# Patient Record
Sex: Male | Born: 1940 | Race: White | Hispanic: No | Smoking: Former smoker
Health system: Southern US, Community
[De-identification: ages and names within clinical notes are randomized; demographics above are authoritative.]

## PROBLEM LIST (undated history)

## (undated) DIAGNOSIS — E119 Type 2 diabetes mellitus without complications: Secondary | ICD-10-CM

## (undated) DIAGNOSIS — R351 Nocturia: Secondary | ICD-10-CM

## (undated) DIAGNOSIS — I350 Nonrheumatic aortic (valve) stenosis: Secondary | ICD-10-CM

## (undated) DIAGNOSIS — R0602 Shortness of breath: Secondary | ICD-10-CM

## (undated) DIAGNOSIS — R011 Cardiac murmur, unspecified: Secondary | ICD-10-CM

## (undated) DIAGNOSIS — B059 Measles without complication: Secondary | ICD-10-CM

## (undated) DIAGNOSIS — G4730 Sleep apnea, unspecified: Secondary | ICD-10-CM

## (undated) DIAGNOSIS — I10 Essential (primary) hypertension: Secondary | ICD-10-CM

## (undated) DIAGNOSIS — B269 Mumps without complication: Secondary | ICD-10-CM

## (undated) DIAGNOSIS — M549 Dorsalgia, unspecified: Secondary | ICD-10-CM

## (undated) DIAGNOSIS — I714 Abdominal aortic aneurysm, without rupture, unspecified: Secondary | ICD-10-CM

## (undated) DIAGNOSIS — H9190 Unspecified hearing loss, unspecified ear: Secondary | ICD-10-CM

## (undated) DIAGNOSIS — M1990 Unspecified osteoarthritis, unspecified site: Secondary | ICD-10-CM

## (undated) HISTORY — PX: OTHER SURGICAL HISTORY: SHX169

## (undated) HISTORY — DX: Abdominal aortic aneurysm, without rupture, unspecified: I71.40

---
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
## 1998-05-31 ENCOUNTER — Ambulatory Visit: Admission: RE | Admit: 1998-05-31 | Discharge: 1998-05-31 | Payer: Self-pay | Admitting: Family Medicine

## 1998-06-13 ENCOUNTER — Ambulatory Visit (HOSPITAL_COMMUNITY): Admission: RE | Admit: 1998-06-13 | Discharge: 1998-06-13 | Payer: Self-pay | Admitting: Gastroenterology

## 1999-07-17 HISTORY — PX: OTHER SURGICAL HISTORY: SHX169

## 2000-02-23 ENCOUNTER — Ambulatory Visit (HOSPITAL_BASED_OUTPATIENT_CLINIC_OR_DEPARTMENT_OTHER): Admission: RE | Admit: 2000-02-23 | Discharge: 2000-02-23 | Payer: Self-pay | Admitting: Orthopedic Surgery

## 2003-03-19 ENCOUNTER — Encounter: Payer: Self-pay | Admitting: Orthopedic Surgery

## 2003-03-24 ENCOUNTER — Encounter: Payer: Self-pay | Admitting: Orthopedic Surgery

## 2003-03-24 ENCOUNTER — Ambulatory Visit (HOSPITAL_COMMUNITY): Admission: RE | Admit: 2003-03-24 | Discharge: 2003-03-25 | Payer: Self-pay | Admitting: Orthopedic Surgery

## 2012-01-25 ENCOUNTER — Encounter: Payer: Self-pay | Admitting: *Deleted

## 2012-01-25 NOTE — Progress Notes (Signed)
[facility]  Clinical Social Work  Clinical Social Work met with Kirk Roberts and his wife at [facility] and completed advance directives. CSW will send documents to medical records to be scanned into patient's chart.  Kathrin Penner, MSW, [facility] Clinical Social Worker [facility] [phone]

## 2014-03-18 ENCOUNTER — Other Ambulatory Visit: Payer: Self-pay | Admitting: Orthopedic Surgery

## 2014-03-18 NOTE — Progress Notes (Signed)
Preoperative surgical orders have been place into the Epic hospital system for Kirk Roberts on 03/18/2014, 1:13 PM  by Patrica Duel for surgery on 04/19/2014.  Preop Total Knee orders including Experal, IV Tylenol, and IV Decadron as long as there are no contraindications to the above medications. Avel Peace, PA-C

## 2014-05-04 ENCOUNTER — Ambulatory Visit: Payer: Self-pay | Admitting: Orthopedic Surgery

## 2014-05-04 ENCOUNTER — Other Ambulatory Visit (HOSPITAL_COMMUNITY): Payer: Self-pay | Admitting: *Deleted

## 2014-05-04 NOTE — Patient Instructions (Addendum)
20 Marygrace DroughtRobert Cancelliere  05/04/2014   Your procedure is scheduled on: Wednesday October 28th, 2015  [facility]  Entrance and follow signs to  Short Stay Center at 1200 PM  Call this number if you have problems the morning of surgery [phone]   Remember:  Do not eat food :After Midnight.   clear liquids midnight until 800 am day of surgery, no clear liquids after 800 am day of surgery.   Take these medicines the morning of surgery with A SIP OF WATER: no meds to take                               You may not have any metal on your body including hair pins and piercings  Do not wear jewelry, make-up, lotions, powders, or deodorant.   Men may shave face and neck.  Do not bring valuables to the hospital. [facility] IS NOT RESPONSIBLE FOR VALUABLES.  Contacts, dentures or bridgework may not be worn into surgery.  Leave suitcase in the car. After surgery it may be brought to your room.  For patients admitted to the hospital, checkout time is 11:00 AM the day of discharge.   Patients discharged the day of surgery will not be allowed to drive home.  Name and phone number of your driver:  Special Instructions: N/A ________________________________________________________________________  [facility] - Preparing for Surgery Before surgery, you can play an important role.  Because skin is not sterile, your skin needs to be as free of germs as possible.  You can reduce the number of germs on your skin by washing with CHG (chlorahexidine gluconate) soap before surgery.  CHG is an antiseptic cleaner which kills germs and bonds with the skin to continue killing germs even after washing. Please DO NOT use if you have an allergy to CHG or antibacterial soaps.  If your skin becomes reddened/irritated stop using the CHG and inform your nurse when you arrive at Short Stay. Do not shave (including legs and underarms) for at least 48 hours prior to the first CHG shower.  You  may shave your face/neck. Please follow these instructions carefully:  1.  Shower with CHG Soap the night before surgery and the  morning of Surgery.  2.  If you choose to wash your hair, wash your hair first as usual with your  normal  shampoo.  3.  After you shampoo, rinse your hair and body thoroughly to remove the  shampoo.                           4.  Use CHG as you would any other liquid soap.  You can apply chg directly  to the skin and wash                       Gently with a scrungie or clean washcloth.  5.  Apply the CHG Soap to your body ONLY FROM THE NECK DOWN.   Do not use on face/ open                           Wound or open sores. Avoid contact with eyes, ears mouth and genitals (private parts).  Wash face,  Genitals (private parts) with your normal soap.             6.  Wash thoroughly, paying special attention to the area where your surgery  will be performed.  7.  Thoroughly rinse your body with warm water from the neck down.  8.  DO NOT shower/wash with your normal soap after using and rinsing off  the CHG Soap.                9.  Pat yourself dry with a clean towel.            10.  Wear clean pajamas.            11.  Place clean sheets on your bed the night of your first shower and do not  sleep with pets. Day of Surgery : Do not apply any lotions/deodorants the morning of surgery.  Please wear clean clothes to the hospital/surgery center.  FAILURE TO FOLLOW THESE INSTRUCTIONS MAY RESULT IN THE CANCELLATION OF YOUR SURGERY PATIENT SIGNATURE_________________________________  NURSE SIGNATURE__________________________________  ________________________________________________________________________    CLEAR LIQUID DIET   Foods Allowed                                                                     Foods Excluded  Coffee and tea, regular and decaf                             liquids that you cannot  Plain Jell-O in any flavor                                              see through such as: Fruit ices (not with fruit pulp)                                     milk, soups, orange juice  Iced Popsicles                                    All solid food Carbonated beverages, regular and diet                                    Cranberry, grape and apple juices Sports drinks like Gatorade Lightly seasoned clear broth or consume(fat free) Sugar, honey syrup  Sample Menu Breakfast                                Lunch                                     Supper Cranberry juice  Beef broth                            Chicken broth Jell-O                                     Grape juice                           Apple juice Coffee or tea                        Jell-O                                      Popsicle                                                Coffee or tea                        Coffee or tea  _____________________________________________________________________    Incentive Spirometer  An incentive spirometer is a tool that can help keep your lungs clear and active. This tool measures how well you are filling your lungs with each breath. Taking long deep breaths may help reverse or decrease the chance of developing breathing (pulmonary) problems (especially infection) following:  A long period of time when you are unable to move or be active. BEFORE THE PROCEDURE   If the spirometer includes an indicator to show your best effort, your nurse or respiratory therapist will set it to a desired goal.  If possible, sit up straight or lean slightly forward. Try not to slouch.  Hold the incentive spirometer in an upright position. INSTRUCTIONS FOR USE  1. Sit on the edge of your bed if possible, or sit up as far as you can in bed or on a chair. 2. Hold the incentive spirometer in an upright position. 3. Breathe out normally. 4. Place the mouthpiece in your mouth and seal your lips tightly around  it. 5. Breathe in slowly and as deeply as possible, raising the piston or the ball toward the top of the column. 6. Hold your breath for 3-5 seconds or for as long as possible. Allow the piston or ball to fall to the bottom of the column. 7. Remove the mouthpiece from your mouth and breathe out normally. 8. Rest for a few seconds and repeat Steps 1 through 7 at least 10 times every 1-2 hours when you are awake. Take your time and take a few normal breaths between deep breaths. 9. The spirometer may include an indicator to show your best effort. Use the indicator as a goal to work toward during each repetition. 10. After each set of 10 deep breaths, practice coughing to be sure your lungs are clear. If you have an incision (the cut made at the time of surgery), support your incision when coughing by placing a pillow or rolled up towels firmly against it. Once you are able to get out of bed, walk around indoors and cough well. You may stop using the incentive  spirometer when instructed by your caregiver.  RISKS AND COMPLICATIONS  Take your time so you do not get dizzy or light-headed.  If you are in pain, you may need to take or ask for pain medication before doing incentive spirometry. It is harder to take a deep breath if you are having pain. AFTER USE  Rest and breathe slowly and easily.  It can be helpful to keep track of a log of your progress. Your caregiver can provide you with a simple table to help with this. If you are using the spirometer at home, follow these instructions: [place] IF:   You are having difficultly using the spirometer.  You have trouble using the spirometer as often as instructed.  Your pain medication is not giving enough relief while using the spirometer.  You develop fever of 100.5 F (38.1 C) or higher. SEEK IMMEDIATE MEDICAL CARE IF:   You cough up bloody sputum that had not been present before.  You develop fever of 102 F (38.9 C) or  greater.  You develop worsening pain at or near the incision site. MAKE SURE YOU:   Understand these instructions.  Will watch your condition.  Will get help right away if you are not doing well or get worse. Document Released: 11/12/2006 Document Revised: 09/24/2011 Document Reviewed: 01/13/2007 ExitCare Patient Information 2014 ExitCare, [place].   ________________________________________________________________________  WHAT IS A BLOOD TRANSFUSION? Blood Transfusion Information  A transfusion is the replacement of blood or some of its parts. Blood is made up of multiple cells which provide different functions.  Red blood cells carry oxygen and are used for blood loss replacement.  White blood cells fight against infection.  Platelets control bleeding.  Plasma helps clot blood.  Other blood products are available for specialized needs, such as hemophilia or other clotting disorders. BEFORE THE TRANSFUSION  Who gives blood for transfusions?   Healthy volunteers who are fully evaluated to make sure their blood is safe. This is blood bank blood. Transfusion therapy is the safest it has ever been in the practice of medicine. Before blood is taken from a donor, a complete history is taken to make sure that person has no history of diseases nor engages in risky social behavior (examples are intravenous drug use or sexual activity with multiple partners). The donor's travel history is screened to minimize risk of transmitting infections, such as malaria. The donated blood is tested for signs of infectious diseases, such as HIV and hepatitis. The blood is then tested to be sure it is compatible with you in order to minimize the chance of a transfusion reaction. If you or a relative donates blood, this is often done in anticipation of surgery and is not appropriate for emergency situations. It takes many days to process the donated blood. RISKS AND COMPLICATIONS Although transfusion therapy  is very safe and saves many lives, the main dangers of transfusion include:   Getting an infectious disease.  Developing a transfusion reaction. This is an allergic reaction to something in the blood you were given. Every precaution is taken to prevent this. The decision to have a blood transfusion has been considered carefully by your caregiver before blood is given. Blood is not given unless the benefits outweigh the risks. AFTER THE TRANSFUSION  Right after receiving a blood transfusion, you will usually feel much better and more energetic. This is especially true if your red blood cells have gotten low (anemic). The transfusion raises the level of the red  blood cells which carry oxygen, and this usually causes an energy increase.  The nurse administering the transfusion will monitor you carefully for complications. HOME CARE INSTRUCTIONS  No special instructions are needed after a transfusion. You may find your energy is better. Speak with your caregiver about any limitations on activity for underlying diseases you may have. SEEK MEDICAL CARE IF:   Your condition is not improving after your transfusion.  You develop redness or irritation at the intravenous (IV) site. SEEK IMMEDIATE MEDICAL CARE IF:  Any of the following symptoms occur over the next 12 hours:  Shaking chills.  You have a temperature by mouth above 102 F (38.9 C), not controlled by medicine.  Chest, back, or muscle pain.  People around you feel you are not acting correctly or are confused.  Shortness of breath or difficulty breathing.  Dizziness and fainting.  You get a rash or develop hives.  You have a decrease in urine output.  Your urine turns a dark color or changes to pink, red, or brown. Any of the following symptoms occur over the next 10 days:  You have a temperature by mouth above 102 F (38.9 C), not controlled by medicine.  Shortness of breath.  Weakness after normal activity.  The white  part of the eye turns yellow (jaundice).  You have a decrease in the amount of urine or are urinating less often.  Your urine turns a dark color or changes to pink, red, or brown. Document Released: 06/29/2000 Document Revised: 09/24/2011 Document Reviewed: 02/16/2008 [facility] Patient Information 2014 [place], [place].  _______________________________________________________________________

## 2014-05-04 NOTE — Progress Notes (Signed)
Preoperative surgical orders have been place into the Epic hospital system for Kirk DroughtRobert Roberts on 05/04/2014, 2:14 PM  by Patrica DuelPERKINS, Tarig Zimmers for surgery on 05/12/2014.  Preop Total Knee orders including Experal, IV Tylenol, and IV Decadron as long as there are no contraindications to the above medications.  The surgery has been moved up from a later date and orders have been resubmitted. Kirk Peacerew Tell Rozelle, PA-C

## 2014-05-05 ENCOUNTER — Encounter (HOSPITAL_COMMUNITY): Payer: Self-pay | Admitting: Pharmacy Technician

## 2014-05-05 ENCOUNTER — Encounter (HOSPITAL_COMMUNITY)
Admission: RE | Admit: 2014-05-05 | Discharge: 2014-05-05 | Disposition: A | Discharge status: Home / Self Care | Payer: Medicare Other | Source: Ambulatory Visit | Attending: Orthopedic Surgery | Admitting: Orthopedic Surgery

## 2014-05-05 ENCOUNTER — Encounter (HOSPITAL_COMMUNITY): Payer: Self-pay

## 2014-05-05 DIAGNOSIS — Z01818 Encounter for other preprocedural examination: Secondary | ICD-10-CM | POA: Insufficient documentation

## 2014-05-05 DIAGNOSIS — M179 Osteoarthritis of knee, unspecified: Secondary | ICD-10-CM | POA: Diagnosis not present

## 2014-05-05 HISTORY — DX: Mumps without complication: B26.9

## 2014-05-05 HISTORY — DX: Essential (primary) hypertension: I10

## 2014-05-05 HISTORY — DX: Nocturia: R35.1

## 2014-05-05 HISTORY — DX: Measles without complication: B05.9

## 2014-05-05 HISTORY — DX: Shortness of breath: R06.02

## 2014-05-05 HISTORY — DX: Type 2 diabetes mellitus without complications: E11.9

## 2014-05-05 HISTORY — DX: Unspecified osteoarthritis, unspecified site: M19.90

## 2014-05-05 HISTORY — DX: Cardiac murmur, unspecified: R01.1

## 2014-05-05 HISTORY — DX: Dorsalgia, unspecified: M54.9

## 2014-05-05 HISTORY — DX: Unspecified hearing loss, unspecified ear: H91.90

## 2014-05-05 LAB — COMPREHENSIVE METABOLIC PANEL
ALT: 63 U/L — ABNORMAL HIGH (ref 0–53)
ANION GAP: 16 — AB (ref 5–15)
AST: 45 U/L — ABNORMAL HIGH (ref 0–37)
Albumin: 4.2 g/dL (ref 3.5–5.2)
Alkaline Phosphatase: 63 U/L (ref 39–117)
BUN: 17 mg/dL (ref 6–23)
CALCIUM: 9.4 mg/dL (ref 8.4–10.5)
CO2: 22 meq/L (ref 19–32)
CREATININE: 1.12 mg/dL (ref 0.50–1.35)
Chloride: 104 mEq/L (ref 96–112)
GFR, EST AFRICAN AMERICAN: 74 mL/min — AB (ref >90)
GFR, EST NON AFRICAN AMERICAN: 64 mL/min — AB (ref >90)
GLUCOSE: 158 mg/dL — AB (ref 70–99)
Potassium: 3.8 mEq/L (ref 3.7–5.3)
Sodium: 142 mEq/L (ref 137–147)
Total Bilirubin: 0.6 mg/dL (ref 0.3–1.2)
Total Protein: 7.3 g/dL (ref 6.0–8.3)

## 2014-05-05 LAB — SURGICAL PCR SCREEN
MRSA, PCR: NEGATIVE
Staphylococcus aureus: NEGATIVE

## 2014-05-05 LAB — URINALYSIS, ROUTINE W REFLEX MICROSCOPIC
Bilirubin Urine: NEGATIVE
GLUCOSE, UA: NEGATIVE mg/dL
HGB URINE DIPSTICK: NEGATIVE
KETONES UR: NEGATIVE mg/dL
Leukocytes, UA: NEGATIVE
Nitrite: NEGATIVE
PH: 5.5 (ref 5.0–8.0)
Protein, ur: NEGATIVE mg/dL
Specific Gravity, Urine: 1.023 (ref 1.005–1.030)
Urobilinogen, UA: 0.2 mg/dL (ref 0.0–1.0)

## 2014-05-05 LAB — CBC
HCT: 42 % (ref 39.0–52.0)
HEMOGLOBIN: 14.3 g/dL (ref 13.0–17.0)
MCH: 32.9 pg (ref 26.0–34.0)
MCHC: 34 g/dL (ref 30.0–36.0)
MCV: 96.8 fL (ref 78.0–100.0)
Platelets: 134 10*3/uL — ABNORMAL LOW (ref 150–400)
RBC: 4.34 MIL/uL (ref 4.22–5.81)
RDW: 13.1 % (ref 11.5–15.5)
WBC: 7.1 10*3/uL (ref 4.0–10.5)

## 2014-05-05 LAB — APTT: aPTT: 31 seconds (ref 24–37)

## 2014-05-05 LAB — PROTIME-INR
INR: 1.09 (ref 0.00–1.49)
PROTHROMBIN TIME: 14.2 s (ref 11.6–15.2)

## 2014-05-05 LAB — ABO/RH: ABO/RH(D): O POS

## 2014-05-05 NOTE — Progress Notes (Signed)
Chest xray 2 view 03-31-14 [facility] on chart Stress test, has cardiac  Clearance note on stress test  04-29-14 dr Donnie Ahotilley on chart ekg 04-16-14 dr Donnie Ahotilley on chart lov note dr Donnie Ahotilley 04-16-14 on chart

## 2014-05-11 ENCOUNTER — Ambulatory Visit: Payer: Self-pay | Admitting: Orthopedic Surgery

## 2014-05-11 MED ORDER — DEXTROSE 5 % IV SOLN
3.0000 g | INTRAVENOUS | Status: AC
  Administered 2014-05-12: 3 g via INTRAVENOUS
  Filled 2014-05-11: qty 3000

## 2014-05-11 NOTE — H&P (Signed)
Kirk Roberts DOB: 09/01/1940 Widowed / Language: English / Race: White Male Date of Admission: 05/12/2014 History of Present Illness (Kirk Ruane L. Jawara Latorre [name]; 05/06/2014 2:35 PM) The patient is a 73 year old male who comes in  for a preoperative History and Physical. The patient is scheduled for a right total knee arthroplasty to be performed by Dr. [name], MD at [facility] on 05/12/2014.  He was originally scheduled for 10/5 but had to have a cardiac clearance. The patient is a 72 year old male who reports right knee symptoms including: pain (medial and lateral) and locking which began year(s) ago without any known injury (Patient states that his knee has hurt for many years. It hurts constantly but gets worse with activity. He has noticed that it locks up on him lately and he has lost his range of motion.).The patient feels that the symptoms are worsening. Unfortunately, the right knee is getting progressively worse over time. It is limiting what he can and cannot do. He is an avid golfer but cannot play golf right now because of the pain in the knee. He has a hard time getting around and walking. He generally does okay at rest but occasionally has pain at night which keeps him awake. 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 He's here today for evaluation of the right knee and for consideration of treatment options. He said this knee is as bad or worse than the left knee was when he had that one fixed. He is ready to get the knee fixed at this time. They have been treated conservatively in the past for the above stated problem and despite conservative measures, they continue to have progressive pain and severe functional limitations and dysfunction. They have failed non-operative management including home exercise, medications, and injections. It is felt that they would benefit from undergoing total  joint replacement. Risks and benefits of the procedure have been discussed with the patient and they elect to proceed with surgery. There are no active contraindications to surgery such as ongoing infection or rapidly progressive neurological disease. He has since been evaluated by Cardiology and has been cleard to proceed with surgery.  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 age 47; Black Lung, Cerberal Hemorrhage Mother Deceased. age 92  Social History (Kirk Hreha L Alvie Speltz, [name]; 05/06/2014 2:13 PM) No history of drug/alcohol rehab Marital status widowed Not under pain contract Tobacco use Former smoker. 01/28/2014 Exercise Exercises weekly; does individual sport Former drinker 01/28/2014: In the past drank wine less than 5 times per week Number of flights of stairs before winded greater than 5 Current work status retired Post-Surgical Plans [facility] Advance Directives Living Will, Healthcare POA  Medication History (Kirk Seabrooks L Esraa Seres, [name]; 05/06/2014 2:15 PM) Prinzide (Oral) Specific dose unknown - Active. Lovastatin (Oral) Specific dose unknown - Active.  Past Surgical History (Kirk Giammarco L Caedyn Tassinari, [name]; 05/06/2014 2:15 PM) Left Partial Knee Replacement Date: 2001. Dr. Duda  Medical History Diabetes Mellitus, Type II Measles Mumps Impaired Hearing Hypertension Heart murmur Anxiety Disorder Hypercholesterolemia   Review of Systems (Kirk Lazenby L. Nabil Bubolz [name]; 05/06/2014 2:12 PM) General Present- Fatigue. Not Present- Chills, Fever,   Memory Loss, Night Sweats, Weight Gain and Weight Loss. Skin Not Present- Eczema,  Hives, Itching, Lesions and Rash. HEENT Present- Hearing Loss. Not Present- Dentures, Double Vision, Headache, Tinnitus and Visual Loss. Respiratory Present- Shortness of breath with exertion. Not Present- Allergies, Chronic Cough, Coughing up blood and Shortness of breath at rest. Cardiovascular Not Present- Chest Pain, Difficulty Breathing Lying Down, Murmur, Palpitations, Racing/skipping heartbeats and Swelling. Gastrointestinal Not Present- Abdominal Pain, Bloody Stool, Constipation, Diarrhea, Difficulty Swallowing, Heartburn, Jaundice, Loss of appetitie, Nausea and Vomiting. Male Genitourinary Not Present- Blood in Urine, Discharge, Flank Pain, Incontinence, Painful Urination, Urgency, Urinary frequency, Urinary Retention, Urinating at Night and Weak urinary stream. Musculoskeletal Present- Back Pain, Morning Stiffness and Muscle Pain. Not Present- Joint Pain, Joint Swelling, Muscle Weakness and Spasms. Neurological Not Present- Blackout spells, Difficulty with balance, Dizziness, Paralysis, Tremor and Weakness. Psychiatric Not Present- Insomnia  Vitals (Kirk Bekele L. Ethal Gotay [name]; 05/06/2014 2:19 PM) 05/06/2014 2:16 PM Weight: 170 lb Height: 72in Weight was reported by patient. Height was reported by patient. Body Surface Area: 1.98 m Body Mass Index: 23.06 kg/m  Pulse: 76 (Regular)  BP: 118/76 (Sitting, Right Arm, Standard)   Physical Exam (Kirk Quint L. Dorothia Passmore [name]; 05/06/2014 2:20 PM) General Mental Status -Alert, cooperative and good historian. General Appearance-pleasant, Not in acute distress. Orientation-Oriented X3. Build & Nutrition-Overweight, Well nourished and Well developed.  Head and Neck Head-normocephalic, atraumatic . Neck Global Assessment - supple, no bruit auscultated on the right, no bruit auscultated on the left. Note: upper denture plate   Eye Pupil - Bilateral-Regular and Round. Motion - Bilateral-EOMI.  Chest  and Lung Exam Auscultation Breath sounds - clear at anterior chest wall and clear at posterior chest wall. Adventitious sounds - No Adventitious sounds.  Cardiovascular Auscultation Rhythm - Regular rate and rhythm. Heart Sounds - S1 WNL and S2 WNL. Murmurs & Other Heart Sounds - Auscultation of the heart reveals - No Murmurs.  Abdomen Inspection Contour - Generalized moderate distention. Palpation/Percussion Tenderness - Abdomen is non-tender to palpation. Rigidity (guarding) - Abdomen is soft. Auscultation Auscultation of the abdomen reveals - Bowel sounds normal.  Male Genitourinary Note: Not done, not pertinent to present illness   Musculoskeletal Note: On exam, he's A&O, in no apparent distress. His hips show normal ROM with no discomfort. His left knee shows no effusion. His ROM on that left knee is 0-125 degrees. There is no instability. Right knee shows no effusion. Range is about 5-120. Marked crepitus on ROM with a varus deformity. Tenderness medial greater than lateral. No instability noted. Pulses, sensation and motor are intact, both lower extremities.  RADIOGRAPHS: AP of both knees and lateral show that the unicompartmental replacement on the left remains in good position with no abnormalities. On the right, he has bone on bone arthritis in the medial compartment, and it's just about bone on bone patellofemoral with a lot of spurring.   Assessment & Plan (Laurencia Roma L. 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 Aluisio.  Plan is to go to [facility].  PCP - Dr. Elkins Cardiology - Dr. Tilley - Patient has been seen preoperatively and felt to be stable for surgery. He just had a recent Myoview study with no evidence of ischemia or infarction. Normal estiamted ejection fraction of 57% with normal wall motion and wall thickening. Poor exercise  intolerance.  The patient does not have any contraindications and will receive TXA (  tranexamic acid) prior to surgery.  Signed electronically by Aadyn Buchheit L Yuritzy Zehring, [name] 

## 2014-05-12 ENCOUNTER — Encounter (HOSPITAL_COMMUNITY): Payer: Medicare Other | Admitting: Anesthesiology

## 2014-05-12 ENCOUNTER — Inpatient Hospital Stay (HOSPITAL_COMMUNITY): Payer: Medicare Other | Admitting: Anesthesiology

## 2014-05-12 ENCOUNTER — Encounter (HOSPITAL_COMMUNITY)
Admission: RE | Disposition: A | Discharge status: Skilled Nursing Facility | Payer: Self-pay | Source: Ambulatory Visit | Attending: Orthopedic Surgery

## 2014-05-12 ENCOUNTER — Inpatient Hospital Stay (HOSPITAL_COMMUNITY)
Admission: RE | Admit: 2014-05-12 | Discharge: 2014-05-14 | DRG: 470 | Disposition: A | Discharge status: Skilled Nursing Facility | Payer: Medicare Other | Source: Ambulatory Visit | Attending: Orthopedic Surgery | Admitting: Orthopedic Surgery

## 2014-05-12 ENCOUNTER — Encounter (HOSPITAL_COMMUNITY): Payer: Self-pay | Admitting: *Deleted

## 2014-05-12 DIAGNOSIS — M1711 Unilateral primary osteoarthritis, right knee: Secondary | ICD-10-CM

## 2014-05-12 DIAGNOSIS — H9190 Unspecified hearing loss, unspecified ear: Secondary | ICD-10-CM | POA: Diagnosis present

## 2014-05-12 DIAGNOSIS — Z01812 Encounter for preprocedural laboratory examination: Secondary | ICD-10-CM | POA: Diagnosis not present

## 2014-05-12 DIAGNOSIS — I10 Essential (primary) hypertension: Secondary | ICD-10-CM | POA: Diagnosis present

## 2014-05-12 DIAGNOSIS — E119 Type 2 diabetes mellitus without complications: Secondary | ICD-10-CM | POA: Diagnosis present

## 2014-05-12 DIAGNOSIS — Z6837 Body mass index (BMI) 37.0-37.9, adult: Secondary | ICD-10-CM | POA: Diagnosis not present

## 2014-05-12 DIAGNOSIS — Z96652 Presence of left artificial knee joint: Secondary | ICD-10-CM | POA: Diagnosis present

## 2014-05-12 DIAGNOSIS — M179 Osteoarthritis of knee, unspecified: Principal | ICD-10-CM | POA: Diagnosis present

## 2014-05-12 DIAGNOSIS — G4730 Sleep apnea, unspecified: Secondary | ICD-10-CM | POA: Diagnosis present

## 2014-05-12 DIAGNOSIS — M25561 Pain in right knee: Secondary | ICD-10-CM | POA: Diagnosis present

## 2014-05-12 DIAGNOSIS — M1710 Unilateral primary osteoarthritis, unspecified knee: Secondary | ICD-10-CM | POA: Diagnosis present

## 2014-05-12 HISTORY — PX: TOTAL KNEE ARTHROPLASTY: SHX125

## 2014-05-12 HISTORY — DX: Sleep apnea, unspecified: G47.30

## 2014-05-12 LAB — TYPE AND SCREEN
ABO/RH(D): O POS
Antibody Screen: NEGATIVE

## 2014-05-12 LAB — GLUCOSE, CAPILLARY
GLUCOSE-CAPILLARY: 120 mg/dL — AB (ref 70–99)
Glucose-Capillary: 113 mg/dL — ABNORMAL HIGH (ref 70–99)

## 2014-05-12 SURGERY — ARTHROPLASTY, KNEE, TOTAL
Anesthesia: Spinal | Site: Knee | Laterality: Right

## 2014-05-12 MED ORDER — SODIUM CHLORIDE 0.9 % IJ SOLN
INTRAMUSCULAR | Status: AC
  Filled 2014-05-12: qty 50

## 2014-05-12 MED ORDER — OXYCODONE HCL 5 MG PO TABS
5.0000 mg | ORAL_TABLET | ORAL | Status: DC | PRN
  Administered 2014-05-12: 10 mg via ORAL
  Administered 2014-05-12 (×2): 5 mg via ORAL
  Administered 2014-05-13 – 2014-05-14 (×7): 10 mg via ORAL
  Filled 2014-05-12: qty 1
  Filled 2014-05-12 (×9): qty 2
  Filled 2014-05-12: qty 1
  Filled 2014-05-12: qty 2

## 2014-05-12 MED ORDER — SODIUM CHLORIDE 0.9 % IJ SOLN
INTRAMUSCULAR | Status: DC | PRN
  Administered 2014-05-12: 30 mL

## 2014-05-12 MED ORDER — MENTHOL 3 MG MT LOZG: 1.0000 | LOZENGE | OROMUCOSAL | Status: DC | PRN

## 2014-05-12 MED ORDER — PROMETHAZINE HCL 25 MG/ML IJ SOLN: 6.2500 mg | INTRAMUSCULAR | Status: DC | PRN

## 2014-05-12 MED ORDER — MIDAZOLAM HCL 2 MG/2ML IJ SOLN
INTRAMUSCULAR | Status: AC
  Filled 2014-05-12: qty 2

## 2014-05-12 MED ORDER — RIVAROXABAN 10 MG PO TABS
10.0000 mg | ORAL_TABLET | Freq: Every day | ORAL | Status: DC
  Administered 2014-05-13 – 2014-05-14 (×2): 10 mg via ORAL
  Filled 2014-05-12 (×3): qty 1

## 2014-05-12 MED ORDER — TRAMADOL HCL 50 MG PO TABS: 50.0000 mg | ORAL_TABLET | Freq: Four times a day (QID) | ORAL | Status: DC | PRN

## 2014-05-12 MED ORDER — EPHEDRINE SULFATE 50 MG/ML IJ SOLN
INTRAMUSCULAR | Status: DC | PRN
  Administered 2014-05-12 (×2): 10 mg via INTRAVENOUS

## 2014-05-12 MED ORDER — TRANEXAMIC ACID 100 MG/ML IV SOLN
1000.0000 mg | INTRAVENOUS | Status: AC
  Administered 2014-05-12: 1000 mg via INTRAVENOUS
  Filled 2014-05-12: qty 10

## 2014-05-12 MED ORDER — ACETAMINOPHEN 500 MG PO TABS
1000.0000 mg | ORAL_TABLET | Freq: Four times a day (QID) | ORAL | Status: AC
  Administered 2014-05-12 – 2014-05-13 (×3): 1000 mg via ORAL
  Filled 2014-05-12 (×4): qty 2

## 2014-05-12 MED ORDER — ONDANSETRON HCL 4 MG/2ML IJ SOLN: 4.0000 mg | Freq: Four times a day (QID) | INTRAMUSCULAR | Status: DC | PRN

## 2014-05-12 MED ORDER — BUPIVACAINE IN DEXTROSE 0.75-8.25 % IT SOLN
INTRATHECAL | Status: DC | PRN
  Administered 2014-05-12: 1.8 mL via INTRATHECAL

## 2014-05-12 MED ORDER — POTASSIUM CHLORIDE IN NACL 20-0.45 MEQ/L-% IV SOLN
INTRAVENOUS | Status: DC
  Administered 2014-05-12: 100 mL/h via INTRAVENOUS
  Administered 2014-05-13: 02:00:00 via INTRAVENOUS
  Filled 2014-05-12 (×3): qty 1000

## 2014-05-12 MED ORDER — ACETAMINOPHEN 650 MG RE SUPP: 650.0000 mg | Freq: Four times a day (QID) | RECTAL | Status: DC | PRN

## 2014-05-12 MED ORDER — METHOCARBAMOL 500 MG PO TABS
500.0000 mg | ORAL_TABLET | Freq: Four times a day (QID) | ORAL | Status: DC | PRN
  Administered 2014-05-12 – 2014-05-14 (×6): 500 mg via ORAL
  Filled 2014-05-12 (×7): qty 1

## 2014-05-12 MED ORDER — LACTATED RINGERS IV SOLN
INTRAVENOUS | Status: DC
  Administered 2014-05-12 (×2): via INTRAVENOUS

## 2014-05-12 MED ORDER — FLEET ENEMA 7-19 GM/118ML RE ENEM: 1.0000 | ENEMA | Freq: Once | RECTAL | Status: AC | PRN

## 2014-05-12 MED ORDER — PHENOL 1.4 % MT LIQD: 1.0000 | OROMUCOSAL | Status: DC | PRN

## 2014-05-12 MED ORDER — FENTANYL CITRATE 0.05 MG/ML IJ SOLN
INTRAMUSCULAR | Status: AC
  Filled 2014-05-12: qty 2

## 2014-05-12 MED ORDER — DOCUSATE SODIUM 100 MG PO CAPS
100.0000 mg | ORAL_CAPSULE | Freq: Two times a day (BID) | ORAL | Status: DC
  Administered 2014-05-12 – 2014-05-14 (×4): 100 mg via ORAL

## 2014-05-12 MED ORDER — PROPOFOL 10 MG/ML IV BOLUS
INTRAVENOUS | Status: DC | PRN
  Administered 2014-05-12: 20 mg via INTRAVENOUS

## 2014-05-12 MED ORDER — POLYETHYLENE GLYCOL 3350 17 G PO PACK: 17.0000 g | PACK | Freq: Every day | ORAL | Status: DC | PRN

## 2014-05-12 MED ORDER — ACETAMINOPHEN 10 MG/ML IV SOLN
1000.0000 mg | Freq: Once | INTRAVENOUS | Status: AC
  Administered 2014-05-12: 1000 mg via INTRAVENOUS
  Filled 2014-05-12: qty 100

## 2014-05-12 MED ORDER — 0.9 % SODIUM CHLORIDE (POUR BTL) OPTIME
TOPICAL | Status: DC | PRN
  Administered 2014-05-12: 1000 mL

## 2014-05-12 MED ORDER — BISACODYL 10 MG RE SUPP: 10.0000 mg | Freq: Every day | RECTAL | Status: DC | PRN

## 2014-05-12 MED ORDER — METOCLOPRAMIDE HCL 5 MG/ML IJ SOLN: 5.0000 mg | Freq: Three times a day (TID) | INTRAMUSCULAR | Status: DC | PRN

## 2014-05-12 MED ORDER — PHENYLEPHRINE HCL 10 MG/ML IJ SOLN
INTRAMUSCULAR | Status: DC | PRN
  Administered 2014-05-12: 80 ug via INTRAVENOUS

## 2014-05-12 MED ORDER — SODIUM CHLORIDE 0.9 % IR SOLN
Status: DC | PRN
  Administered 2014-05-12: 1000 mL

## 2014-05-12 MED ORDER — ONDANSETRON HCL 4 MG PO TABS: 4.0000 mg | ORAL_TABLET | Freq: Four times a day (QID) | ORAL | Status: DC | PRN

## 2014-05-12 MED ORDER — PROPOFOL 10 MG/ML IV BOLUS
INTRAVENOUS | Status: AC
Start: 2014-05-12 — End: 2014-05-12
  Filled 2014-05-12: qty 20

## 2014-05-12 MED ORDER — PROPOFOL INFUSION 10 MG/ML OPTIME
INTRAVENOUS | Status: DC | PRN
  Administered 2014-05-12: 75 ug/kg/min via INTRAVENOUS

## 2014-05-12 MED ORDER — DEXTROSE 5 % IV SOLN
500.0000 mg | Freq: Four times a day (QID) | INTRAVENOUS | Status: DC | PRN
  Administered 2014-05-12: 500 mg via INTRAVENOUS
  Filled 2014-05-12: qty 5

## 2014-05-12 MED ORDER — SODIUM CHLORIDE 0.9 % IJ SOLN
INTRAMUSCULAR | Status: AC
  Filled 2014-05-12: qty 10

## 2014-05-12 MED ORDER — DEXAMETHASONE SODIUM PHOSPHATE 10 MG/ML IJ SOLN
10.0000 mg | Freq: Once | INTRAMUSCULAR | Status: AC
  Administered 2014-05-12: 10 mg via INTRAVENOUS

## 2014-05-12 MED ORDER — KETOROLAC TROMETHAMINE 15 MG/ML IJ SOLN
7.5000 mg | Freq: Four times a day (QID) | INTRAMUSCULAR | Status: AC | PRN
  Administered 2014-05-12: 7.5 mg via INTRAVENOUS
  Filled 2014-05-12 (×2): qty 1

## 2014-05-12 MED ORDER — DIPHENHYDRAMINE HCL 12.5 MG/5ML PO ELIX
12.5000 mg | ORAL_SOLUTION | ORAL | Status: DC | PRN
  Administered 2014-05-12: 25 mg via ORAL
  Filled 2014-05-12: qty 10

## 2014-05-12 MED ORDER — CEFAZOLIN SODIUM-DEXTROSE 2-3 GM-% IV SOLR
2.0000 g | Freq: Four times a day (QID) | INTRAVENOUS | Status: AC
  Administered 2014-05-12 – 2014-05-13 (×2): 2 g via INTRAVENOUS
  Filled 2014-05-12 (×2): qty 50

## 2014-05-12 MED ORDER — BUPIVACAINE LIPOSOME 1.3 % IJ SUSP
INTRAMUSCULAR | Status: DC | PRN
  Administered 2014-05-12: 20 mL

## 2014-05-12 MED ORDER — PROPOFOL 10 MG/ML IV BOLUS
INTRAVENOUS | Status: AC
  Filled 2014-05-12: qty 20

## 2014-05-12 MED ORDER — EPHEDRINE SULFATE 50 MG/ML IJ SOLN
INTRAMUSCULAR | Status: AC
  Filled 2014-05-12: qty 1

## 2014-05-12 MED ORDER — BUPIVACAINE HCL (PF) 0.25 % IJ SOLN
INTRAMUSCULAR | Status: AC
  Filled 2014-05-12: qty 30

## 2014-05-12 MED ORDER — ACETAMINOPHEN 325 MG PO TABS
650.0000 mg | ORAL_TABLET | Freq: Four times a day (QID) | ORAL | Status: DC | PRN
  Administered 2014-05-13: 650 mg via ORAL
  Filled 2014-05-12: qty 2

## 2014-05-12 MED ORDER — BUPIVACAINE LIPOSOME 1.3 % IJ SUSP
20.0000 mL | Freq: Once | INTRAMUSCULAR | Status: DC
  Filled 2014-05-12: qty 20

## 2014-05-12 MED ORDER — DEXAMETHASONE SODIUM PHOSPHATE 10 MG/ML IJ SOLN
INTRAMUSCULAR | Status: AC
  Filled 2014-05-12: qty 1

## 2014-05-12 MED ORDER — PHENYLEPHRINE 40 MCG/ML (10ML) SYRINGE FOR IV PUSH (FOR BLOOD PRESSURE SUPPORT)
PREFILLED_SYRINGE | INTRAVENOUS | Status: AC
  Filled 2014-05-12: qty 10

## 2014-05-12 MED ORDER — DEXAMETHASONE SODIUM PHOSPHATE 10 MG/ML IJ SOLN
10.0000 mg | Freq: Once | INTRAMUSCULAR | Status: AC
  Administered 2014-05-13: 10 mg via INTRAVENOUS
  Filled 2014-05-12: qty 1

## 2014-05-12 MED ORDER — BUPIVACAINE HCL 0.25 % IJ SOLN
INTRAMUSCULAR | Status: DC | PRN
  Administered 2014-05-12: 20 mL

## 2014-05-12 MED ORDER — HYDROMORPHONE HCL 1 MG/ML IJ SOLN
0.2500 mg | INTRAMUSCULAR | Status: DC | PRN
Start: 2014-05-12 — End: 2014-05-12

## 2014-05-12 MED ORDER — ONDANSETRON HCL 4 MG/2ML IJ SOLN
INTRAMUSCULAR | Status: AC
  Filled 2014-05-12: qty 2

## 2014-05-12 MED ORDER — FENTANYL CITRATE 0.05 MG/ML IJ SOLN
INTRAMUSCULAR | Status: DC | PRN
  Administered 2014-05-12 (×2): 50 ug via INTRAVENOUS

## 2014-05-12 MED ORDER — ONDANSETRON HCL 4 MG/2ML IJ SOLN
INTRAMUSCULAR | Status: DC | PRN
  Administered 2014-05-12: 4 mg via INTRAVENOUS

## 2014-05-12 MED ORDER — MORPHINE SULFATE 2 MG/ML IJ SOLN
1.0000 mg | INTRAMUSCULAR | Status: DC | PRN
  Administered 2014-05-12: 1 mg via INTRAVENOUS
  Filled 2014-05-12: qty 1

## 2014-05-12 MED ORDER — METOCLOPRAMIDE HCL 10 MG PO TABS: 5.0000 mg | ORAL_TABLET | Freq: Three times a day (TID) | ORAL | Status: DC | PRN

## 2014-05-12 MED ORDER — SODIUM CHLORIDE 0.9 % IV SOLN: INTRAVENOUS | Status: DC

## 2014-05-12 MED ORDER — MIDAZOLAM HCL 5 MG/5ML IJ SOLN
INTRAMUSCULAR | Status: DC | PRN
  Administered 2014-05-12: 2 mg via INTRAVENOUS

## 2014-05-12 SURGICAL SUPPLY — 61 items
BAG SPEC THK2 15X12 ZIP CLS (MISCELLANEOUS)
BAG ZIPLOCK 12X15 (MISCELLANEOUS) ×1 IMPLANT
BANDAGE ELASTIC 6 VELCRO ST LF (GAUZE/BANDAGES/DRESSINGS) ×2 IMPLANT
BANDAGE ESMARK 6X9 LF (GAUZE/BANDAGES/DRESSINGS) ×1 IMPLANT
BLADE SAG 18X100X1.27 (BLADE) ×2 IMPLANT
BLADE SAW SGTL 11.0X1.19X90.0M (BLADE) ×2 IMPLANT
BNDG CMPR 9X6 STRL LF SNTH (GAUZE/BANDAGES/DRESSINGS) ×1
BNDG ESMARK 6X9 LF (GAUZE/BANDAGES/DRESSINGS) ×2
BOWL SMART MIX CTS (DISPOSABLE) ×2 IMPLANT
CAPT RP KNEE ×1 IMPLANT
CEMENT HV SMART SET (Cement) ×4 IMPLANT
CUFF TOURN SGL QUICK 34 (TOURNIQUET CUFF) ×2
CUFF TRNQT CYL 34X4X40X1 (TOURNIQUET CUFF) ×1 IMPLANT
DECANTER SPIKE VIAL GLASS SM (MISCELLANEOUS) ×2 IMPLANT
DRAPE EXTREMITY TIBURON (DRAPES) ×2 IMPLANT
DRAPE POUCH INSTRU U-SHP 10X18 (DRAPES) ×2 IMPLANT
DRAPE U-SHAPE 47X51 STRL (DRAPES) ×2 IMPLANT
DRSG ADAPTIC 3X8 NADH LF (GAUZE/BANDAGES/DRESSINGS) ×2 IMPLANT
DRSG PAD ABDOMINAL 8X10 ST (GAUZE/BANDAGES/DRESSINGS) ×2 IMPLANT
DURAPREP 26ML APPLICATOR (WOUND CARE) ×2 IMPLANT
ELECT REM PT RETURN 9FT ADLT (ELECTROSURGICAL) ×2
ELECTRODE REM PT RTRN 9FT ADLT (ELECTROSURGICAL) ×1 IMPLANT
EVACUATOR 1/8 PVC DRAIN (DRAIN) ×2 IMPLANT
FACESHIELD WRAPAROUND (MASK) ×10 IMPLANT
FACESHIELD WRAPAROUND OR TEAM (MASK) ×5 IMPLANT
GAUZE SPONGE 4X4 12PLY STRL (GAUZE/BANDAGES/DRESSINGS) ×2 IMPLANT
GLOVE BIO SURGEON STRL SZ7.5 (GLOVE) ×2 IMPLANT
GLOVE BIO SURGEON STRL SZ8 (GLOVE) ×3 IMPLANT
GLOVE BIOGEL PI IND STRL 6.5 (GLOVE) IMPLANT
GLOVE BIOGEL PI IND STRL 8 (GLOVE) ×1 IMPLANT
GLOVE BIOGEL PI IND STRL 8.5 (GLOVE) IMPLANT
GLOVE BIOGEL PI INDICATOR 6.5 (GLOVE) ×1
GLOVE BIOGEL PI INDICATOR 8 (GLOVE) ×2
GLOVE BIOGEL PI INDICATOR 8.5 (GLOVE) ×1
GOWN STRL REUS W/TWL LRG LVL3 (GOWN DISPOSABLE) ×2 IMPLANT
GOWN STRL REUS W/TWL XL LVL3 (GOWN DISPOSABLE) ×3 IMPLANT
HANDPIECE INTERPULSE COAX TIP (DISPOSABLE) ×2
IMMOBILIZER KNEE 20 (SOFTGOODS) ×2
IMMOBILIZER KNEE 20 THIGH 36 (SOFTGOODS) ×1 IMPLANT
KIT BASIN OR (CUSTOM PROCEDURE TRAY) ×2 IMPLANT
MANIFOLD NEPTUNE II (INSTRUMENTS) ×2 IMPLANT
NDL SAFETY ECLIPSE 18X1.5 (NEEDLE) ×2 IMPLANT
NEEDLE HYPO 18GX1.5 SHARP (NEEDLE) ×4
NS IRRIG 1000ML POUR BTL (IV SOLUTION) ×2 IMPLANT
PACK TOTAL JOINT (CUSTOM PROCEDURE TRAY) ×2 IMPLANT
PADDING CAST COTTON 6X4 STRL (CAST SUPPLIES) ×5 IMPLANT
POSITIONER SURGICAL ARM (MISCELLANEOUS) ×2 IMPLANT
SET HNDPC FAN SPRY TIP SCT (DISPOSABLE) ×1 IMPLANT
STRIP CLOSURE SKIN 1/2X4 (GAUZE/BANDAGES/DRESSINGS) ×4 IMPLANT
SUCTION FRAZIER 12FR DISP (SUCTIONS) ×2 IMPLANT
SUT MNCRL AB 4-0 PS2 18 (SUTURE) ×2 IMPLANT
SUT VIC AB 2-0 CT1 27 (SUTURE) ×6
SUT VIC AB 2-0 CT1 TAPERPNT 27 (SUTURE) ×3 IMPLANT
SUT VLOC 180 0 24IN GS25 (SUTURE) ×2 IMPLANT
SYR 20CC LL (SYRINGE) ×2 IMPLANT
SYR 50ML LL SCALE MARK (SYRINGE) ×2 IMPLANT
TOWEL OR 17X26 10 PK STRL BLUE (TOWEL DISPOSABLE) ×2 IMPLANT
TOWEL OR NON WOVEN STRL DISP B (DISPOSABLE) ×1 IMPLANT
TRAY FOLEY CATH 16FRSI W/METER (SET/KITS/TRAYS/PACK) ×1 IMPLANT
WATER STERILE IRR 1500ML POUR (IV SOLUTION) ×2 IMPLANT
WRAP KNEE MAXI GEL POST OP (GAUZE/BANDAGES/DRESSINGS) ×2 IMPLANT

## 2014-05-12 NOTE — Anesthesia Preprocedure Evaluation (Signed)
Anesthesia Evaluation  Patient identified by MRN, date of birth, ID band Patient awake    Reviewed: Allergy & Precautions, H&P , NPO status , Patient's Chart, lab work & pertinent test results  Airway Mallampati: III  TM Distance: >3 FB Neck ROM: Full    Dental no notable dental hx.    Pulmonary Current Smoker,  breath sounds clear to auscultation  Pulmonary exam normal       Cardiovascular hypertension, Pt. on medications Rhythm:Regular Rate:Normal     Neuro/Psych negative neurological ROS  negative psych ROS   GI/Hepatic negative GI ROS, Neg liver ROS,   Endo/Other  Morbid obesity  Renal/GU negative Renal ROS  negative genitourinary   Musculoskeletal negative musculoskeletal ROS (+)   Abdominal   Peds negative pediatric ROS (+)  Hematology negative hematology ROS (+)   Anesthesia Other Findings   Reproductive/Obstetrics negative OB ROS                             Anesthesia Physical Anesthesia Plan  ASA: III  Anesthesia Plan: Spinal   Post-op Pain Management:    Induction: Intravenous  Airway Management Planned: Simple Face Mask  Additional Equipment:   Intra-op Plan:   Post-operative Plan:   Informed Consent: I have reviewed the patients History and Physical, chart, labs and discussed the procedure including the risks, benefits and alternatives for the proposed anesthesia with the patient or authorized representative who has indicated his/her understanding and acceptance.   Dental advisory given  Plan Discussed with: CRNA and Surgeon  Anesthesia Plan Comments:         Anesthesia Quick Evaluation

## 2014-05-12 NOTE — H&P (View-Only) (Signed)
Kirk Roberts DOB: Nov 29, 1940 Widowed / Language: [place] / Race: White Male Date of Admission: 05/12/2014 History of Present Illness (Alexzandrew L. Perkins [name]; 05/06/2014 2:35 PM) The patient is a 73 year old male who comes in  for a preoperative History and Physical. The patient is scheduled for a right total knee arthroplasty to be performed by Dr. Gus Rankin. Aluisio, MD at [facility] on 05/12/2014.  He was originally scheduled for 10/5 but had to have a cardiac clearance. The patient is a 72 year old male who reports right knee symptoms including: pain (medial and lateral) and locking which began year(s) ago without any known injury (Patient states that his knee has hurt for many years. It hurts constantly but gets worse with activity. He has noticed that it locks up on him lately and he has lost his range of motion.).The patient feels that the symptoms are worsening. Unfortunately, the right knee is getting progressively worse over time. It is limiting what he can and cannot do. He is an avid golfer but cannot play golf right now because of the pain in the knee. He has a hard time getting around and walking. He generally does okay at rest but occasionally has pain at night which keeps him awake. 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 He's here today for evaluation of the right knee and for consideration of treatment options. He said this knee is as bad or worse than the left knee was when he had that one fixed. He is ready to get the knee fixed at this time. They have been treated conservatively in the past for the above stated problem and despite conservative measures, they continue to have progressive pain and severe functional limitations and dysfunction. They have failed non-operative management including home exercise, medications, and injections. It is felt that they would benefit from undergoing total  joint replacement. Risks and benefits of the procedure have been discussed with the patient and they elect to proceed with surgery. There are no active contraindications to surgery such as ongoing infection or rapidly progressive neurological disease. He has since been evaluated by Cardiology and has been cleard to proceed with surgery.  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 age 72; Black Lung, Cerberal Hemorrhage Mother Deceased. age 40  Social History (Alexzandrew Tessie Fass, [name]; 05/06/2014 2:13 PM) No history of drug/alcohol rehab Marital status widowed Not under pain contract Tobacco use Former smoker. 01/28/2014 Exercise Exercises weekly; does individual sport Former drinker 01/28/2014: In the past drank wine less than 5 times per week Number of flights of stairs before winded greater than 5 Current work status retired Financial trader at [employer]  Medication History (Alexzandrew Tessie Fass, [name]; 05/06/2014 2:15 PM) Prinzide (Oral) Specific dose unknown - Active. Lovastatin (Oral) Specific dose unknown - Active.  Past Surgical History (Alexzandrew Tessie Fass, [name]; 05/06/2014 2:15 PM) Left Partial Knee Replacement Date: 2001. Dr. Lajoyce Corners  Medical History Diabetes Mellitus, Type II Measles Mumps Impaired Hearing Hypertension Heart murmur Anxiety Disorder Hypercholesterolemia   Review of Systems (Alexzandrew L. Perkins [name]; 05/06/2014 2:12 PM) General Present- Fatigue. Not Present- Chills, Fever,  Memory Loss, Night Sweats, Weight Gain and Weight Loss. Skin Not Present- Eczema,  Hives, Itching, Lesions and Rash. HEENT Present- Hearing Loss. Not Present- Dentures, Double Vision, Headache, Tinnitus and Visual Loss. Respiratory Present- Shortness of breath with exertion. Not Present- Allergies, Chronic Cough, Coughing up blood and Shortness of breath at rest. Cardiovascular Not Present- Chest Pain, Difficulty Breathing Lying Down, Murmur, Palpitations, Racing/skipping heartbeats and Swelling. Gastrointestinal Not Present- Abdominal Pain, Bloody Stool, Constipation, Diarrhea, Difficulty Swallowing, Heartburn, Jaundice, Loss of appetitie, Nausea and Vomiting. Male Genitourinary Not Present- Blood in Urine, Discharge, Flank Pain, Incontinence, Painful Urination, Urgency, Urinary frequency, Urinary Retention, Urinating at Night and Weak urinary stream. Musculoskeletal Present- Back Pain, Morning Stiffness and Muscle Pain. Not Present- Joint Pain, Joint Swelling, Muscle Weakness and Spasms. Neurological Not Present- Blackout spells, Difficulty with balance, Dizziness, Paralysis, Tremor and Weakness. Psychiatric Not Present- Insomnia  Vitals (Alexzandrew L. Perkins [name]; 05/06/2014 2:19 PM) 05/06/2014 2:16 PM Weight: 170 lb Height: 72in Weight was reported by patient. Height was reported by patient. Body Surface Area: 1.98 m Body Mass Index: 23.06 kg/m  Pulse: 76 (Regular)  BP: 118/76 (Sitting, Right Arm, Standard)   Physical Exam (Alexzandrew L. Perkins [name]; 05/06/2014 2:20 PM) General Mental Status -Alert, cooperative and good historian. General Appearance-pleasant, Not in acute distress. Orientation-Oriented X3. Build & Nutrition-Overweight, Well nourished and Well developed.  Head and Neck Head-normocephalic, atraumatic . Neck Global Assessment - supple, no bruit auscultated on the right, no bruit auscultated on the left. Note: upper denture plate   Eye Pupil - Bilateral-Regular and Round. Motion - Bilateral-EOMI.  Chest  and Lung Exam Auscultation Breath sounds - clear at anterior chest wall and clear at posterior chest wall. Adventitious sounds - No Adventitious sounds.  Cardiovascular Auscultation Rhythm - Regular rate and rhythm. Heart Sounds - S1 WNL and S2 WNL. Murmurs & Other Heart Sounds - Auscultation of the heart reveals - No Murmurs.  Abdomen Inspection Contour - Generalized moderate distention. Palpation/Percussion Tenderness - Abdomen is non-tender to palpation. Rigidity (guarding) - Abdomen is soft. Auscultation Auscultation of the abdomen reveals - Bowel sounds normal.  Male Genitourinary Note: Not done, not pertinent to present illness   Musculoskeletal Note: On exam, he's A&O, in no apparent distress. His hips show normal ROM with no discomfort. His left knee shows no effusion. His ROM on that left knee is 0-125 degrees. There is no instability. Right knee shows no effusion. Range is about 5-120. Marked crepitus on ROM with a varus deformity. Tenderness medial greater than lateral. No instability noted. Pulses, sensation and motor are intact, both lower extremities.  RADIOGRAPHS: AP of both knees and lateral show that the unicompartmental replacement on the left remains in good position with no abnormalities. On the right, he has bone on bone arthritis in the medial compartment, and it's just about bone on bone patellofemoral with a lot of spurring.   Assessment & Plan (Alexzandrew L. 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 Lequita HaltAluisio.  [facility]  PCP - Dr. Jeannetta NapElkins Cardiology - Dr. Donnie Ahoilley - Patient has been seen preoperatively and felt to be stable for surgery. He just had a recent Myoview study with no evidence of ischemia or infarction. Normal estiamted ejection fraction of 57% with normal wall motion and wall thickening. Poor exercise  intolerance.  The patient does not have any contraindications and will receive TXA (  tranexamic acid) prior to surgery.  Signed electronically by Lauraine RinneAlexzandrew L Perkins, [name]

## 2014-05-12 NOTE — Anesthesia Procedure Notes (Signed)
Spinal  Patient location during procedure: OR Start time: 05/12/2014 1:45 PM End time: 05/12/2014 1:49 PM Staffing Anesthesiologist: ROSE, Iona Beard CRNA/Resident: Lajuana Carry E Performed by: resident/CRNA  Preanesthetic Checklist Completed: patient identified, site marked, surgical consent, pre-op evaluation, timeout performed, IV checked, risks and benefits discussed and monitors and equipment checked Spinal Block Patient position: sitting Prep: Betadine Patient monitoring: heart rate, continuous pulse ox and blood pressure Location: L3-4 Injection technique: single-shot Needle Needle type: Sprotte  Needle gauge: 24 G Needle length: 9 cm Assessment Sensory level: T6 Additional Notes Expiration date of kit checked and confirmed. Time out performed. Clear CSF pre/post injection. Patient tolerated procedure well, without complications.

## 2014-05-12 NOTE — Interval H&P Note (Signed)
History and Physical Interval Note:  05/12/2014 1:34 PM  Kirk Roberts  has presented today for surgery, with the diagnosis of Osteoarthritis of right knee  The various methods of treatment have been discussed with the patient and family. After consideration of risks, benefits and other options for treatment, the patient has consented to  Procedure(s): RIGHT TOTAL KNEE ARTHROPLASTY (Right) as a surgical intervention .  The patient's history has been reviewed, patient examined, no change in status, stable for surgery.  I have reviewed the patient's chart and labs.  Questions were answered to the patient's satisfaction.     Loanne DrillingALUISIO,Lantz Hermann V

## 2014-05-12 NOTE — Op Note (Signed)
Pre-operative diagnosis- Osteoarthritis  Right knee(s)  Post-operative diagnosis- Osteoarthritis Right knee(s)  Procedure-  Right  Total Knee Arthroplasty  Surgeon- Gus RankinFrank V. Hend Mccarrell, MD  Assistant- Avel Peacerew Perkins, PA-C   Anesthesia-  Spinal  EBL-* No blood loss amount entered *   Drains Hemovac  Tourniquet time-  Total Tourniquet Time Documented: Thigh (Right) - 38 minutes Total: Thigh (Right) - 38 minutes     Complications- None  Condition-PACU - hemodynamically stable.   Brief Clinical Note  Kirk DroughtRobert Roberts is a 73 y.o. year old male with end stage OA of his right knee with progressively worsening pain and dysfunction. He has constant pain, with activity and at rest and significant functional deficits with difficulties even with ADLs. He has had extensive non-op management including analgesics, injections of cortisone and viscosupplements, and home exercise program, but remains in significant pain with significant dysfunction. Radiographs show bone on bone arthritis medial and patellofemoral compartments. He presents now for right Total Knee Arthroplasty.    Procedure in detail---   The patient is brought into the operating room and positioned supine on the operating table. After successful administration of  Spinal,   a tourniquet is placed high on the  Right thigh(s) and the lower extremity is prepped and draped in the usual sterile fashion. Time out is performed by the operating team and then the  Right lower extremity is wrapped in Esmarch, knee flexed and the tourniquet inflated to 300 mmHg.       A midline incision is made with a ten blade through the subcutaneous tissue to the level of the extensor mechanism. A fresh blade is used to make a medial parapatellar arthrotomy. Soft tissue over the proximal medial tibia is subperiosteally elevated to the joint line with a knife and into the semimembranosus bursa with a Cobb elevator. Soft tissue over the proximal lateral tibia is  elevated with attention being paid to avoiding the patellar tendon on the tibial tubercle. The patella is everted, knee flexed 90 degrees and the ACL and PCL are removed. Findings are bone on bone medial and patellofemoral with large emdial osteophytes.        The drill is used to create a starting hole in the distal femur and the canal is thoroughly irrigated with sterile saline to remove the fatty contents. The 5 degree Right  valgus alignment guide is placed into the femoral canal and the distal femoral cutting block is pinned to remove 10 mm off the distal femur. Resection is made with an oscillating saw.      The tibia is subluxed forward and the menisci are removed. The extramedullary alignment guide is placed referencing proximally at the medial aspect of the tibial tubercle and distally along the second metatarsal axis and tibial crest. The block is pinned to remove 2mm off the more deficient medial  side. Resection is made with an oscillating saw. Size 5is the most appropriate size for the tibia and the proximal tibia is prepared with the modular drill and keel punch for that size.      The femoral sizing guide is placed and size 4 is most appropriate. Rotation is marked off the epicondylar axis and confirmed by creating a rectangular flexion gap at 90 degrees. The size 4 cutting block is pinned in this rotation and the anterior, posterior and chamfer cuts are made with the oscillating saw. The intercondylar block is then placed and that cut is made.      Trial size 5 tibial component,  trial size 4 posterior stabilized femur and a 12.5  mm posterior stabilized rotating platform insert trial is placed. Full extension is achieved with excellent varus/valgus and anterior/posterior balance throughout full range of motion. The patella is everted and thickness measured to be 27  mm. Free hand resection is taken to 15 mm, a 41 template is placed, lug holes are drilled, trial patella is placed, and it tracks  normally. Osteophytes are removed off the posterior femur with the trial in place. All trials are removed and the cut bone surfaces prepared with pulsatile lavage. Cement is mixed and once ready for implantation, the size 5 tibial implant, size  4 posterior stabilized femoral component, and the size 41 patella are cemented in place and the patella is held with the clamp. The trial insert is placed and the knee held in full extension. The Exparel (20 ml mixed with 30 ml saline) and .25% Bupivicaine, are injected into the extensor mechanism, posterior capsule, medial and lateral gutters and subcutaneous tissues.  All extruded cement is removed and once the cement is hard the permanent 12.5 mm posterior stabilized rotating platform insert is placed into the tibial tray.      The wound is copiously irrigated with saline solution and the extensor mechanism closed over a hemovac drain with #1 V-loc suture. The tourniquet is released for a total tourniquet time of 36  minutes. Flexion against gravity is 140 degrees and the patella tracks normally. Subcutaneous tissue is closed with 2.0 vicryl and subcuticular with running 4.0 Monocryl. The incision is cleaned and dried and steri-strips and a bulky sterile dressing are applied. The limb is placed into a knee immobilizer and the patient is awakened and transported to recovery in stable condition.      Please note that a surgical assistant was a medical necessity for this procedure in order to perform it in a safe and expeditious manner. Surgical assistant was necessary to retract the ligaments and vital neurovascular structures to prevent injury to them and also necessary for proper positioning of the limb to allow for anatomic placement of the prosthesis.   Gus RankinFrank V. [name], MD    05/12/2014, 2:54 PM

## 2014-05-12 NOTE — Anesthesia Postprocedure Evaluation (Signed)
  Anesthesia Post-op Note  Patient: Kirk DroughtRobert Kolodziej  Procedure(s) Performed: Procedure(s) (LRB): RIGHT TOTAL KNEE ARTHROPLASTY (Right)  Patient Location: PACU  Anesthesia Type: Spinal  Level of Consciousness: awake and alert   Airway and Oxygen Therapy: Patient Spontanous Breathing  Post-op Pain: mild  Post-op Assessment: Post-op Vital signs reviewed, Patient's Cardiovascular Status Stable, Respiratory Function Stable, Patent Airway and No signs of Nausea or vomiting  Last Vitals:  Filed Vitals:   05/12/14 1615  BP: 152/75  Pulse: 49  Temp: 36.5 C  Resp: 15    Post-op Vital Signs: stable   Complications: No apparent anesthesia complications

## 2014-05-12 NOTE — Transfer of Care (Signed)
Immediate Anesthesia Transfer of Care Note  Patient: Kirk DroughtRobert Demartin  Procedure(s) Performed: Procedure(s) (LRB): RIGHT TOTAL KNEE ARTHROPLASTY (Right)  Patient Location: PACU  Anesthesia Type: Spinal  Level of Consciousness: sedated, patient cooperative and responds to stimulation  Airway & Oxygen Therapy: Patient Spontanous Breathing and Patient connected to face mask oxgen  Post-op Assessment: Report given to PACU RN and Post -op Vital signs reviewed and stable  Post vital signs: Reviewed and stable  Complications: No apparent anesthesia complications

## 2014-05-13 ENCOUNTER — Encounter (HOSPITAL_COMMUNITY): Payer: Self-pay | Admitting: Orthopedic Surgery

## 2014-05-13 LAB — BASIC METABOLIC PANEL
ANION GAP: 14 (ref 5–15)
BUN: 16 mg/dL (ref 6–23)
CHLORIDE: 101 meq/L (ref 96–112)
CO2: 23 meq/L (ref 19–32)
Calcium: 8.5 mg/dL (ref 8.4–10.5)
Creatinine, Ser: 1.08 mg/dL (ref 0.50–1.35)
GFR calc Af Amer: 78 mL/min — ABNORMAL LOW (ref >90)
GFR calc non Af Amer: 67 mL/min — ABNORMAL LOW (ref >90)
Glucose, Bld: 188 mg/dL — ABNORMAL HIGH (ref 70–99)
Potassium: 4.2 mEq/L (ref 3.7–5.3)
Sodium: 138 mEq/L (ref 137–147)

## 2014-05-13 LAB — CBC
HCT: 34.6 % — ABNORMAL LOW (ref 39.0–52.0)
Hemoglobin: 11.6 g/dL — ABNORMAL LOW (ref 13.0–17.0)
MCH: 32.1 pg (ref 26.0–34.0)
MCHC: 33.5 g/dL (ref 30.0–36.0)
MCV: 95.8 fL (ref 78.0–100.0)
Platelets: 129 10*3/uL — ABNORMAL LOW (ref 150–400)
RBC: 3.61 MIL/uL — ABNORMAL LOW (ref 4.22–5.81)
RDW: 12.5 % (ref 11.5–15.5)
WBC: 9.7 10*3/uL (ref 4.0–10.5)

## 2014-05-13 NOTE — Progress Notes (Signed)
Clinical Social Work Department CLINICAL SOCIAL WORK PLACEMENT NOTE 05/13/2014  Patient:  Kirk Roberts,Kirk Roberts  Account Number:  [id number] Admit date:  05/12/2014  Clinical Social Worker:  Cori RazorJAMIE Benjiman Sedgwick, LCSW  Date/time:  05/13/2014 01:49 PM  Clinical Social Work is seeking post-discharge placement for this patient at the following level of care:      (*CSW will update this form in Epic as items are completed)     Patient/family provided with [facility] Department of Clinical Social Work's list of facilities offering this level of care within the geographic area requested by the patient (or if unable, by the patient's family).  05/13/2014  Patient/family informed of their freedom to choose among providers that offer the needed level of care, that participate in Medicare, Medicaid or managed care program needed by the patient, have an available bed and are willing to accept the patient.    Patient/family informed of [facility]' ownership interest in [facility], as well as of the fact that they are under no obligation to receive care at this facility.  PASARR submitted to EDS on 05/13/2014 PASARR number received on 05/13/2014  FL2 transmitted to all facilities in geographic area requested by pt/family on  05/13/2014 FL2 transmitted to all facilities within larger geographic area on   Patient informed that his/her managed care company has contracts with or will negotiate with  certain facilities, including the following:     Patient/family informed of bed offers received:   Patient chooses bed at  Physician recommends and patient chooses bed at    Patient to be transferred to  on   Patient to be transferred to facility by  Patient and family notified of transfer on  Name of family member notified:    The following physician request were entered in Epic:   Additional Comments:

## 2014-05-13 NOTE — Progress Notes (Signed)
   Subjective: 1 Day Post-Op Procedure(s) (LRB): RIGHT TOTAL KNEE ARTHROPLASTY (Right) Patient reports pain as mild.   Patient seen in rounds with Dr. Lequita HaltAluisio. He is doing very well this morning. Patient is well, but has had some minor complaints of pain in the knee, requiring pain medications We will start therapy today.  Plan is [facility] after hospital stay.  Objective: Vital signs in last 24 hours: Temp:  [97.6 F (36.4 C)-98.6 F (37 C)] 97.9 F (36.6 C) (10/29 0500) Pulse Rate:  [49-73] 61 (10/29 0500) Resp:  [13-20] 20 (10/29 0500) BP: (119-163)/(69-88) 124/88 mmHg (10/29 0500) SpO2:  [92 %-100 %] 92 % (10/29 0500) Weight:  [126.1 kg (278 lb)] 126.1 kg (278 lb) (10/28 1625)  Intake/Output from previous day:  Intake/Output Summary (Last 24 hours) at 05/13/14 0916 Last data filed at 05/13/14 0817  Gross per 24 hour  Intake 3243.33 ml  Output   1515 ml  Net 1728.33 ml    Intake/Output this shift: Total I/O In: 240 [P.O.:240] Out: -   Labs:  Recent Labs  05/13/14 0408  HGB 11.6*    Recent Labs  05/13/14 0408  WBC 9.7  RBC 3.61*  HCT 34.6*  PLT 129*    Recent Labs  05/13/14 0408  NA 138  K 4.2  CL 101  CO2 23  BUN 16  CREATININE 1.08  GLUCOSE 188*  CALCIUM 8.5   No results found for this basename: LABPT, INR,  in the last 72 hours  EXAM General - Patient is Alert, Appropriate and Oriented Extremity - Neurovascular intact Sensation intact distally Dorsiflexion/Plantar flexion intact Dressing - dressing C/D/I Motor Function - intact, moving foot and toes well on exam.  Hemovac pulled without difficulty.  Past Medical History  Diagnosis Date  . Hard of hearing   . Arthritis     oa  . Hypertension   . Heart murmur   . Measles 1950's  . Mumps 1950's  . Back pain     at times  . Shortness of breath     on exertion  . Nocturia   . Diabetes mellitus without complication     diet controlled  . Sleep apnea    no CPAP    Assessment/Plan: 1 Day Post-Op Procedure(s) (LRB): RIGHT TOTAL KNEE ARTHROPLASTY (Right) Principal Problem:   OA (osteoarthritis) of knee  Estimated body mass index is 37.7 kg/(m^2) as calculated from the following:   Height as of this encounter: 6' (1.829 m).   Weight as of this encounter: 126.1 kg (278 lb). Advance diet Up with therapy Discharge to SNF - possibly tomorrow or Saturday  Lisinopril/HCTZ on hold postop.  Will resume if BP goes up.  DVT Prophylaxis - Xarelto Weight-Bearing as tolerated to right leg D/C O2 and Pulse OX and try on Room Air  Avel Peacerew Perkins, PA-C Orthopaedic Surgery 05/13/2014, 9:16 AM

## 2014-05-13 NOTE — Evaluation (Signed)
Occupational Therapy Evaluation Patient Details Name: Kirk Roberts MRN: [id number] DOB: [birth year] Today's Date: 05/13/2014    History of Present Illness R TKR   Clinical Impression   This 73 year old man was admitted for the above surgery.  He was independent with adls prior to admission and now needs overall min A.  Pt lives alone and will benefit from skilled OT in the acute setting as well as follow up in STSNF.    Follow Up Recommendations  SNF    Equipment Recommendations  3 in 1 bedside comode    Recommendations for Other Services       Precautions / Restrictions Precautions Precautions: Fall;Knee Required Braces or Orthoses: Knee Immobilizer - Right Knee Immobilizer - Right: Discontinue once straight leg raise with < 10 degree lag Restrictions Weight Bearing Restrictions: No Other Position/Activity Restrictions: WBAT      Mobility Bed Mobility           Sit to supine: Min guard   General bed mobility comments: using leg lifter  Transfers Overall transfer level: Needs assistance Equipment used: Rolling walker (2 wheeled) Transfers: Sit to/from Stand Sit to Stand: Min assist         General transfer comment: cues for LE management and use of UEs to self assist    Balance                                            ADL Overall ADL's : Needs assistance/impaired             Lower Body Bathing: Minimal assistance;Sit to/from stand       Lower Body Dressing: Minimal assistance;Sit to/from stand                 General ADL Comments: Took over from PT.  Pt had completed ADLs this am.  Pt needs min A for LB adls.  He has a Sports administratorreacher at home. Educated on sock aide, but pt did not use this session. Also educated on leg lifter, and pt used this to get back to bed.     Vision                     Perception     Praxis      Pertinent Vitals/Pain Pain Assessment: 0-10 Pain Score: 4  Pain Location: R  knee Pain Descriptors / Indicators: Aching;Sore Pain Intervention(s): Limited activity within patient's tolerance;Monitored during session;Repositioned     Hand Dominance     Extremity/Trunk Assessment Upper Extremity Assessment Upper Extremity Assessment: Overall WFL for tasks assessed           Communication Communication Communication: HOH   Cognition Arousal/Alertness: Awake/alert Behavior During Therapy: WFL for tasks assessed/performed Overall Cognitive Status: Within Functional Limits for tasks assessed                     General Comments       Exercises       Shoulder Instructions      Home Living Family/patient expects to be discharged to:: Skilled nursing facility Living Arrangements: Alone                                      Prior Functioning/Environment Level of Independence: Independent  OT Diagnosis: Generalized weakness   OT Problem List: Decreased knowledge of use of DME or AE;Pain;Decreased strength   OT Treatment/Interventions: Self-care/ADL training;DME and/or AE instruction;Patient/family education    OT Goals(Current goals can be found in the care plan section) Acute Rehab OT Goals Patient Stated Goal: Short term rehab and then home OT Goal Formulation: With patient Time For Goal Achievement: 05/20/14 Potential to Achieve Goals: Good ADL Goals Pt Will Perform Lower Body Bathing: with supervision;sit to/from stand;with adaptive equipment Pt Will Perform Lower Body Dressing: with supervision;with adaptive equipment;sit to/from stand Pt Will Transfer to Toilet: with supervision;ambulating;bedside commode Pt Will Perform Toileting - Clothing Manipulation and hygiene: with supervision;sit to/from stand  OT Frequency: Min 2X/week   Barriers to D/C: Decreased caregiver support          Co-evaluation              End of Session    Activity Tolerance: Patient tolerated treatment well Patient  left: in bed;with call bell/phone within reach   Time: 0207-0213 OT Time Calculation (min): 6 min Charges:  OT General Charges $OT Visit: 1 Procedure OT Evaluation $Initial OT Evaluation Tier I: 1 Procedure G-Codes:    Dontreal Miera 05/13/2014, 2:54 PM Marica OtterMaryellen Oliver Roberts, OTR/L [phone] 05/13/2014

## 2014-05-13 NOTE — Progress Notes (Signed)
OT Cancellation Note  Patient Details Name: Marygrace DroughtRobert Bloodsaw MRN: [id number] DOB: [birth year]   Cancelled Treatment:    Reason Eval/Treat Not Completed: Other (comment) Pt with PT when OT checked in with pt. OT will return later today or in morning. Thanks,  Dorena BodoREDDING, Saniya Tranchina D Lori [place], [place] [phone] 05/13/2014, 1:50 PM

## 2014-05-13 NOTE — Care Management Note (Signed)
    Page 1 of 1   05/13/2014     2:29:31 PM CARE MANAGEMENT NOTE 05/13/2014  Patient:  Kirk Roberts,Kirk Roberts   Account Number:  [id number]  Date Initiated:  05/13/2014  Documentation initiated by:  [facility] ScJEFFRIES,Tahmir Kleckner  Subjective/Objective Assessment:   adm: RIGHT TOTAL KNEE ARTHROPLASTY (Right)     Action/Plan:   snf   Anticipated DC Date:  05/13/2014   Anticipated DC Plan:  SKILLED NURSING FACILITY      DC Planning Services  CM consult      Choice offered to / List presented to:             Status of service:   Medicare Important Message given?   (If response is "NO", the following Medicare IM given date fields will be blank) Date Medicare IM given:   Medicare IM given by:   Date Additional Medicare IM given:   Additional Medicare IM given by:    Discharge Disposition:  SKILLED NURSING FACILITY  Per UR Regulation:    If discussed at Long Length of Stay Meetings, dates discussed:    Comments:  05/13/14 11:00 CM notes pt to go to SNF for rehab; CSW arranging.  No other CM needs were communicated.  Kirk Roberts, BSN, CM [phone].

## 2014-05-13 NOTE — Progress Notes (Signed)
Utilization review completed.  

## 2014-05-13 NOTE — Evaluation (Signed)
Physical Therapy Evaluation Patient Details Name: Kirk DroughtRobert Roberts MRN: [id number] DOB: 1941/06/11 Today's Date: 05/13/2014   History of Present Illness  R TKR  Clinical Impression  Pt s/p R TKR presents with decreased R LE strength/ROM and post op pain limiting functional mobility.  Pt will benefit from follow up rehab at SNF level to maximize IND and safety prior to return home alone    Follow Up Recommendations SNF    Equipment Recommendations  None recommended by PT    Recommendations for Other Services OT consult     Precautions / Restrictions Precautions Precautions: Fall;Knee Required Braces or Orthoses: Knee Immobilizer - Right Knee Immobilizer - Right: Discontinue once straight leg raise with < 10 degree lag Restrictions Weight Bearing Restrictions: No Other Position/Activity Restrictions: WBAT      Mobility  Bed Mobility Overal bed mobility: Needs Assistance Bed Mobility: Supine to Sit     Supine to sit: Min assist     General bed mobility comments: cues for sequence and use of L LE to self assist  Transfers Overall transfer level: Needs assistance Equipment used: Rolling walker (2 wheeled) Transfers: Sit to/from Stand Sit to Stand: Min assist         General transfer comment: cues for LE management and use of UEs to self assist  Ambulation/Gait Ambulation/Gait assistance: Min assist Ambulation Distance (Feet): 36 Feet (twice) Assistive device: Rolling walker (2 wheeled) Gait Pattern/deviations: Step-to pattern;Decreased step length - right;Decreased step length - left;Shuffle;Trunk flexed     General Gait Details: cues for sequence, posture and position from [employer]  Stairs            Wheelchair Mobility    Modified Rankin (Stroke Patients Only)       Balance                                             Pertinent Vitals/Pain Pain Assessment: 0-10 Pain Score: 4  Pain Location: R knee Pain Descriptors /  Indicators: Aching;Sore Pain Intervention(s): Limited activity within patient's tolerance;Monitored during session;Premedicated before session;Ice applied    Home Living Family/patient expects to be discharged to:: Skilled nursing facility Living Arrangements: Alone                    Prior Function Level of Independence: Independent               Hand Dominance   Dominant Hand: Right    Extremity/Trunk Assessment   Upper Extremity Assessment: Overall WFL for tasks assessed           Lower Extremity Assessment: RLE deficits/detail RLE Deficits / Details: AAROM at R knee -10 - 65 with 2+/5 quads    Cervical / Trunk Assessment: Normal  Communication   Communication: HOH  Cognition Arousal/Alertness: Awake/alert Behavior During Therapy: WFL for tasks assessed/performed Overall Cognitive Status: Within Functional Limits for tasks assessed                      General Comments      Exercises Total Joint Exercises Ankle Circles/Pumps: AROM;Both;10 reps;Supine Quad Sets: AROM;Both;10 reps;Supine Heel Slides: AAROM;Right;15 reps;Supine Straight Leg Raises: AAROM;Right;10 reps;Supine      Assessment/Plan    PT Assessment Patient needs continued PT services  PT Diagnosis Difficulty walking   PT Problem List Decreased strength;Decreased range of motion;Decreased activity tolerance;Decreased mobility;Pain;Decreased knowledge  of use of DME;Obesity  PT Treatment Interventions DME instruction;Gait training;Stair training;Functional mobility training;Therapeutic activities;Patient/family education;Therapeutic exercise   PT Goals (Current goals can be found in the Care Plan section) Acute Rehab PT Goals Patient Stated Goal: Short term rehab and then home PT Goal Formulation: With patient Time For Goal Achievement: 05/20/14 Potential to Achieve Goals: Good    Frequency 7X/week   Barriers to discharge        Co-evaluation               End  of Session Equipment Utilized During Treatment: Gait belt Activity Tolerance: Patient tolerated treatment well Patient left: in chair;with call bell/phone within reach;with family/visitor present Nurse Communication: Mobility status         Time: 0902-0938 PT Time Calculation (min): 36 min   Charges:   PT Evaluation $Initial PT Evaluation Tier I: 1 Procedure PT Treatments $Gait Training: 8-22 mins $Therapeutic Exercise: 8-22 mins   PT G Codes:          Addam Goeller 05/13/2014, 12:39 PM

## 2014-05-13 NOTE — Progress Notes (Signed)
Clinical Social Work Department BRIEF PSYCHOSOCIAL ASSESSMENT 05/13/2014  Patient:  Kirk Roberts,Kirk Roberts     Account Number:  [id number]     Admit date:  05/12/2014  Clinical Social Worker:  Lacie Scotts  Date/Time:  05/13/2014 01:23 PM  Referred by:  Physician  Date Referred:  05/13/2014 Referred for  SNF Placement   Other Referral:   Interview type:  Patient Other interview type:    PSYCHOSOCIAL DATA Living Status:  ALONE Admitted from facility:   Level of care:   Primary support name:  Kirk Roberts Primary support relationship to patient:  CHILD, ADULT Degree of support available:   supportive    CURRENT CONCERNS Current Concerns  Post-Acute Placement   Other Concerns:    SOCIAL WORK ASSESSMENT / PLAN Pt is a 73 yr old gentleman living at home prior to hospitalization. CSW met with pt to assist with d/c planning needs. This is a planned admission. Pt has made prior arrangements to have ST Rehab at [facility] ) following hospital d/c. CSW has contacted SNF and awaiting confirmation of d/c plan. CSW will continue to follow to assist with d/c planning to SNF.   Assessment/plan status:  Psychosocial Support/Ongoing Assessment of Needs Other assessment/ plan:   Information/referral to community resources:   Insurance coverage for SNF and ambulance transport reviewed.    PATIENT'S/FAMILY'S RESPONSE TO PLAN OF CARE: Pt had a good night following surgery. He is motivated to begin therapy and is looking forward to having rehab at [place].    Werner Lean LCSW [phone]

## 2014-05-13 NOTE — Progress Notes (Signed)
Physical Therapy Treatment Patient Details Name: Kirk Roberts MRN: [id number] DOB: 04-28-1941 Today's Date: 05/13/2014    History of Present Illness R TKR    PT Comments      Follow Up Recommendations  SNF     Equipment Recommendations  None recommended by PT    Recommendations for Other Services OT consult     Precautions / Restrictions Precautions Precautions: Fall;Knee Required Braces or Orthoses: Knee Immobilizer - Right Knee Immobilizer - Right: Discontinue once straight leg raise with < 10 degree lag Restrictions Weight Bearing Restrictions: No Other Position/Activity Restrictions: WBAT    Mobility  Bed Mobility           Sit to supine: Min guard   General bed mobility comments: using leg lifter  Transfers Overall transfer level: Needs assistance Equipment used: Rolling walker (2 wheeled) Transfers: Sit to/from Stand Sit to Stand: Min assist         General transfer comment: cues for LE management and use of UEs to self assist  Ambulation/Gait Ambulation/Gait assistance: Min assist Ambulation Distance (Feet): 55 Feet (twice) Assistive device: Rolling walker (2 wheeled) Gait Pattern/deviations: Step-to pattern;Shuffle;Trunk flexed     General Gait Details: cues for sequence, posture and position from [employer]   Stairs            Wheelchair Mobility    Modified Rankin (Stroke Patients Only)       Balance                                    Cognition Arousal/Alertness: Awake/alert Behavior During Therapy: WFL for tasks assessed/performed Overall Cognitive Status: Within Functional Limits for tasks assessed                      Exercises      General Comments        Pertinent Vitals/Pain Pain Assessment: 0-10 Pain Score: 4  Pain Location: R knee Pain Descriptors / Indicators: Aching;Sore Pain Intervention(s): Limited activity within patient's tolerance;Monitored during session;Repositioned     Home Living Family/patient expects to be discharged to:: Skilled nursing facility Living Arrangements: Alone                  Prior Function Level of Independence: Independent          PT Goals (current goals can now be found in the care plan section) Acute Rehab PT Goals Patient Stated Goal: Short term rehab and then home PT Goal Formulation: With patient Time For Goal Achievement: 05/20/14 Potential to Achieve Goals: Good Progress towards PT goals: Progressing toward goals    Frequency  7X/week    PT Plan Current plan remains appropriate    Co-evaluation             End of Session Equipment Utilized During Treatment: Gait belt Activity Tolerance: Patient tolerated treatment well Patient left: in bed;with call bell/phone within reach     Time: 1345-1405 PT Time Calculation (min): 20 min  Charges:  $Gait Training: 8-22 mins                    G Codes:      Kirk Roberts 05/13/2014, 4:25 PM

## 2014-05-13 NOTE — Discharge Instructions (Addendum)
° °Dr. Frank Aluisio °Total Joint Specialist °[facility] °[address] °[place], [place] [address] °[phone] ° °TOTAL KNEE REPLACEMENT POSTOPERATIVE DIRECTIONS ° ° ° °Knee Rehabilitation, Guidelines Following Surgery  °Results after knee surgery are often greatly improved when you follow the exercise, range of motion and muscle strengthening exercises prescribed by your doctor. Safety measures are also important to protect the knee from further injury. Any time any of these exercises cause you to have increased pain or swelling in your knee joint, decrease the amount until you are comfortable again and slowly increase them. If you have problems or questions, call your caregiver or physical therapist for advice.  ° °HOME CARE INSTRUCTIONS  °Remove items at home which could result in a fall. This includes throw rugs or furniture in walking pathways.  °Continue medications as instructed at time of discharge. °You may have some home medications which will be placed on hold until you complete the course of blood thinner medication.  °You may start showering once you are discharged home but do not submerge the incision under water. Just pat the incision dry and apply a dry gauze dressing on daily. °Walk with walker as instructed.  °You may resume a sexual relationship in one month or when given the OK by  your doctor.  °· Use walker as long as suggested by your caregivers. °· Avoid periods of inactivity such as sitting longer than an hour when not asleep. This helps prevent blood clots.  °You may put full weight on your legs and walk as much as is comfortable.  °You may return to work once you are cleared by your doctor.  °Do not drive a car for 6 weeks or until released by you surgeon.  °· Do not drive while taking narcotics.  °Wear the elastic stockings for three weeks following surgery during the day but you may remove then at night. °Make sure you keep all of your appointments after your  operation with all of your doctors and caregivers. You should call the office at the above phone number and make an appointment for approximately two weeks after the date of your surgery. °Change the dressing daily and reapply a dry dressing each time. °Please pick up a stool softener and laxative for home use as long as you are requiring pain medications. °· Continue to use ice on the knee for pain and swelling from surgery. You may notice swelling that will progress down to the foot and ankle.  This is normal after surgery.  Elevate the leg when you are not up walking on it.   °It is important for you to complete the blood thinner medication as prescribed by your doctor. °· Continue to use the breathing machine which will help keep your temperature down.  It is common for your temperature to cycle up and down following surgery, especially at night when you are not up moving around and exerting yourself.  The breathing machine keeps your lungs expanded and your temperature down. ° °RANGE OF MOTION AND STRENGTHENING EXERCISES  °Rehabilitation of the knee is important following a knee injury or an operation. After just a few days of immobilization, the muscles of the thigh which control the knee become weakened and shrink (atrophy). Knee exercises are designed to build up the tone and strength of the thigh muscles and to improve knee motion. Often times heat used for twenty to thirty minutes before working out will loosen up your tissues and help with improving the   range of motion but do not use heat for the first two weeks following surgery. These exercises can be done on a training (exercise) mat, on the floor, on a table or on a bed. Use what ever works the best and is most comfortable for you Knee exercises include:  °Leg Lifts - While your knee is still immobilized in a splint or cast, you can do straight leg raises. Lift the leg to 60 degrees, hold for 3 sec, and slowly lower the leg. Repeat 10-20 times 2-3  times daily. Perform this exercise against resistance later as your knee gets better.  °Quad and Hamstring Sets - Tighten up the muscle on the front of the thigh (Quad) and hold for 5-10 sec. Repeat this 10-20 times hourly. Hamstring sets are done by pushing the foot backward against an object and holding for 5-10 sec. Repeat as with quad sets.  °A rehabilitation program following serious knee injuries can speed recovery and prevent re-injury in the future due to weakened muscles. Contact your doctor or a physical therapist for more information on knee rehabilitation.  ° °SKILLED REHAB INSTRUCTIONS: °If the patient is transferred to a skilled rehab facility following release from the hospital, a list of the current medications will be sent to the facility for the patient to continue.  When discharged from the skilled rehab facility, please have the facility set up the patient's Home Health Physical Therapy prior to being released. Also, the skilled facility will be responsible for providing the patient with their medications at time of release from the facility to include their pain medication, the muscle relaxants, and their blood thinner medication. If the patient is still at the rehab facility at time of the two week follow up appointment, the skilled rehab facility will also need to assist the patient in arranging follow up appointment in our office and any transportation needs. ° °MAKE SURE YOU:  °Understand these instructions.  °Will watch your condition.  °Will get help right away if you are not doing well or get worse.  ° ° °Pick up stool softner and laxative for home. °Do not submerge incision under water. °May shower. °Continue to use ice for pain and swelling from surgery. ° ° °Take Xarelto for two and a half more weeks, then discontinue Xarelto. °Once the patient has completed the blood thinner regimen, then take a Baby 81 mg Aspirin daily for three more weeks. ° °When discharged from the skilled rehab  facility, please have the facility set up the patient's Home Health Physical Therapy prior to being released.  Also provide the patient with their medications at time of release from the facility to include their pain medication, the muscle relaxants, and their blood thinner medication.  If the patient is still at the rehab facility at time of follow up appointment, please also assist the patient in arranging follow up appointment in our office and any transportation needs. ° ° ° °Information on my medicine - XARELTO® (Rivaroxaban) ° °This medication education was reviewed with me or my healthcare representative as part of my discharge preparation.  The pharmacist that spoke with me during my hospital stay was:  Green, Terri L, RPH ° °Why was Xarelto® prescribed for you? °Xarelto® was prescribed for you to reduce the risk of blood clots forming after orthopedic surgery. The medical term for these abnormal blood clots is venous thromboembolism (VTE). ° °What do you need to know about xarelto® ? °Take your Xarelto® ONCE DAILY at the same time   every day. °You may take it either with or without food. ° °If you have difficulty swallowing the tablet whole, you may crush it and mix in applesauce just prior to taking your dose. ° °Take Xarelto® exactly as prescribed by your doctor and DO NOT stop taking Xarelto® without talking to the doctor who prescribed the medication.  Stopping without other VTE prevention medication to take the place of Xarelto® may increase your risk of developing a clot. ° °After discharge, you should have regular check-up appointments with your healthcare provider that is prescribing your Xarelto®.   ° °What do you do if you miss a dose? °If you miss a dose, take it as soon as you remember on the same day then continue your regularly scheduled once daily regimen the next day. Do not take two doses of Xarelto® on the same day.  ° °Important Safety Information °A possible side effect of Xarelto® is  bleeding. You should call your healthcare provider right away if you experience any of the following: °  Bleeding from an injury or your nose that does not stop. °  Unusual colored urine (red or dark brown) or unusual colored stools (red or black). °  Unusual bruising for unknown reasons. °  A serious fall or if you hit your head (even if there is no bleeding). ° °Some medicines may interact with Xarelto® and might increase your risk of bleeding while on Xarelto®. To help avoid this, consult your healthcare provider or pharmacist prior to using any new prescription or non-prescription medications, including herbals, vitamins, non-steroidal anti-inflammatory drugs (NSAIDs) and supplements. ° °This website has more information on Xarelto®: www.xarelto.com. ° ° ° °

## 2014-05-14 LAB — CBC
HEMATOCRIT: 32.8 % — AB (ref 39.0–52.0)
HEMOGLOBIN: 11 g/dL — AB (ref 13.0–17.0)
MCH: 32.2 pg (ref 26.0–34.0)
MCHC: 33.5 g/dL (ref 30.0–36.0)
MCV: 95.9 fL (ref 78.0–100.0)
Platelets: 140 10*3/uL — ABNORMAL LOW (ref 150–400)
RBC: 3.42 MIL/uL — ABNORMAL LOW (ref 4.22–5.81)
RDW: 13 % (ref 11.5–15.5)
WBC: 11.5 10*3/uL — ABNORMAL HIGH (ref 4.0–10.5)

## 2014-05-14 LAB — BASIC METABOLIC PANEL
Anion gap: 13 (ref 5–15)
BUN: 21 mg/dL (ref 6–23)
CHLORIDE: 103 meq/L (ref 96–112)
CO2: 24 mEq/L (ref 19–32)
Calcium: 8.9 mg/dL (ref 8.4–10.5)
Creatinine, Ser: 1.19 mg/dL (ref 0.50–1.35)
GFR calc Af Amer: 69 mL/min — ABNORMAL LOW (ref >90)
GFR, EST NON AFRICAN AMERICAN: 60 mL/min — AB (ref >90)
GLUCOSE: 159 mg/dL — AB (ref 70–99)
POTASSIUM: 4.3 meq/L (ref 3.7–5.3)
Sodium: 140 mEq/L (ref 137–147)

## 2014-05-14 MED ORDER — RIVAROXABAN 10 MG PO TABS: 10.0000 mg | ORAL_TABLET | Freq: Every day | ORAL | Status: DC

## 2014-05-14 MED ORDER — TRAMADOL HCL 50 MG PO TABS: 50.0000 mg | ORAL_TABLET | Freq: Four times a day (QID) | ORAL | Status: DC | PRN

## 2014-05-14 MED ORDER — BISACODYL 10 MG RE SUPP: 10.0000 mg | Freq: Every day | RECTAL | Status: DC | PRN

## 2014-05-14 MED ORDER — METOCLOPRAMIDE HCL 5 MG PO TABS: 5.0000 mg | ORAL_TABLET | Freq: Three times a day (TID) | ORAL | Status: DC | PRN

## 2014-05-14 MED ORDER — ACETAMINOPHEN 325 MG PO TABS: 650.0000 mg | ORAL_TABLET | Freq: Four times a day (QID) | ORAL | Status: AC | PRN

## 2014-05-14 MED ORDER — OXYCODONE HCL 5 MG PO TABS: 5.0000 mg | ORAL_TABLET | ORAL | Status: DC | PRN

## 2014-05-14 MED ORDER — ONDANSETRON HCL 4 MG PO TABS: 4.0000 mg | ORAL_TABLET | Freq: Four times a day (QID) | ORAL | Status: DC | PRN

## 2014-05-14 MED ORDER — DSS 100 MG PO CAPS: 100.0000 mg | ORAL_CAPSULE | Freq: Two times a day (BID) | ORAL | Status: DC

## 2014-05-14 MED ORDER — METHOCARBAMOL 500 MG PO TABS: 500.0000 mg | ORAL_TABLET | Freq: Four times a day (QID) | ORAL | Status: DC | PRN

## 2014-05-14 NOTE — Progress Notes (Signed)
   Subjective: 2 Days Post-Op Procedure(s) (LRB): RIGHT TOTAL KNEE ARTHROPLASTY (Right) Patient reports pain as mild.   Plan is to go Rehab after hospital stay.  Objective: Vital signs in last 24 hours: Temp:  [97.8 F (36.6 C)-98.3 F (36.8 C)] 97.8 F (36.6 C) (10/30 0338) Pulse Rate:  [6-69] 62 (10/30 0338) Resp:  [18] 18 (10/30 0338) BP: (117-133)/(65-99) 133/99 mmHg (10/30 0338) SpO2:  [93 %-95 %] 95 % (10/30 0338)  Intake/Output from previous day:  Intake/Output Summary (Last 24 hours) at 05/14/14 0701 Last data filed at 05/14/14 0339  Gross per 24 hour  Intake   1200 ml  Output   1300 ml  Net   -100 ml    Intake/Output this shift:    Labs:  Recent Labs  05/13/14 0408 05/14/14 0520  HGB 11.6* 11.0*    Recent Labs  05/13/14 0408 05/14/14 0520  WBC 9.7 11.5*  RBC 3.61* 3.42*  HCT 34.6* 32.8*  PLT 129* 140*    Recent Labs  05/13/14 0408 05/14/14 0520  NA 138 140  K 4.2 4.3  CL 101 103  CO2 23 24  BUN 16 21  CREATININE 1.08 1.19  GLUCOSE 188* 159*  CALCIUM 8.5 8.9   No results found for this basename: LABPT, INR,  in the last 72 hours  EXAM General - Patient is Alert, Appropriate and Oriented Extremity - Neurologically intact Neurovascular intact No cellulitis present Compartment soft Dressing/Incision - clean, dry, no drainage Motor Function - intact, moving foot and toes well on exam.   Past Medical History  Diagnosis Date  . Hard of hearing   . Arthritis     oa  . Hypertension   . Heart murmur   . Measles 1950's  . Mumps 1950's  . Back pain     at times  . Shortness of breath     on exertion  . Nocturia   . Diabetes mellitus without complication     diet controlled  . Sleep apnea     no CPAP    Assessment/Plan: 2 Days Post-Op Procedure(s) (LRB): RIGHT TOTAL KNEE ARTHROPLASTY (Right) Principal Problem:   OA (osteoarthritis) of knee   Advance diet D/C IV fluids Discharge to SNF  DVT Prophylaxis -  Xarelto Weight-Bearing as tolerated to right leg  Jashira Cotugno V 05/14/2014, 7:01 AM

## 2014-05-14 NOTE — Progress Notes (Signed)
Physical Therapy Treatment Patient Details Name: Kirk DroughtRobert Loiseau MRN: [id number] DOB: [birth year] Today's Date: 05/14/2014    History of Present Illness R TKR    PT Comments      Follow Up Recommendations  SNF     Equipment Recommendations  None recommended by PT    Recommendations for Other Services OT consult     Precautions / Restrictions Precautions Precautions: Fall;Knee Required Braces or Orthoses: Knee Immobilizer - Right Knee Immobilizer - Right: Discontinue once straight leg raise with < 10 degree lag Restrictions Weight Bearing Restrictions: No Other Position/Activity Restrictions: WBAT    Mobility  Bed Mobility Overal bed mobility: Needs Assistance Bed Mobility: Supine to Sit     Supine to sit: Min assist     General bed mobility comments: min assist with R LE  Transfers Overall transfer level: Needs assistance Equipment used: Rolling walker (2 wheeled) Transfers: Sit to/from Stand Sit to Stand: Min assist         General transfer comment: cues for LE management and use of UEs to self assist  Ambulation/Gait Ambulation/Gait assistance: Min guard Ambulation Distance (Feet): 60 Feet (twice) Assistive device: Rolling walker (2 wheeled) Gait Pattern/deviations: Step-to pattern;Decreased step length - right;Decreased step length - left;Shuffle;Trunk flexed Gait velocity: decr   General Gait Details: cues for sequence, posture and position from [employer]   Stairs            Wheelchair Mobility    Modified Rankin (Stroke Patients Only)       Balance                                    Cognition Arousal/Alertness: Awake/alert Behavior During Therapy: WFL for tasks assessed/performed Overall Cognitive Status: Within Functional Limits for tasks assessed                      Exercises Total Joint Exercises Ankle Circles/Pumps: AROM;Both;10 reps;Supine Quad Sets: AROM;Both;Supine;15 reps Heel Slides:  AAROM;Right;15 reps;Supine Straight Leg Raises: AAROM;Right;Supine;20 reps Goniometric ROM: AAROM R knee -10 - 45    General Comments        Pertinent Vitals/Pain Pain Assessment: 0-10 Pain Score: 5  Pain Location: R knee Pain Descriptors / Indicators: Sore Pain Intervention(s): Limited activity within patient's tolerance;Monitored during session;Ice applied;Premedicated before session    Home Living                      Prior Function            PT Goals (current goals can now be found in the care plan section) Acute Rehab PT Goals Patient Stated Goal: Short term rehab and then home PT Goal Formulation: With patient Time For Goal Achievement: 05/20/14 Potential to Achieve Goals: Good Progress towards PT goals: Progressing toward goals    Frequency  7X/week    PT Plan Current plan remains appropriate    Co-evaluation             End of Session Equipment Utilized During Treatment: Gait belt Activity Tolerance: Patient limited by pain Patient left: in chair;with call bell/phone within reach     Time: 1018-1050 PT Time Calculation (min): 32 min  Charges:  $Gait Training: 23-37 mins $Therapeutic Exercise: 23-37 mins                    G Codes:      Nirvana Blanchett 05/14/2014,  1:05 PM

## 2014-05-14 NOTE — Progress Notes (Signed)
Utilization review completed.  

## 2014-05-14 NOTE — Discharge Summary (Signed)
Physician Discharge Summary   Patient ID: Kirk Roberts MRN: [id number] DOB/AGE: 10-15-1940 73 y.o.  Admit date: 05/12/2014 Discharge date: 05/14/2014  Primary Diagnosis:  Osteoarthritis Right knee(s)  Admission Diagnoses:  Past Medical History  Diagnosis Date  . Hard of hearing   . Arthritis     oa  . Hypertension   . Heart murmur   . Measles 1950's  . Mumps 1950's  . Back pain     at times  . Shortness of breath     on exertion  . Nocturia   . Diabetes mellitus without complication     diet controlled  . Sleep apnea     no CPAP   Discharge Diagnoses:   Principal Problem:   OA (osteoarthritis) of knee  Estimated body mass index is 37.7 kg/(m^2) as calculated from the following:   Height as of this encounter: 6' (1.829 m).   Weight as of this encounter: 126.1 kg (278 lb).  Procedure:  Procedure(s) (LRB): RIGHT TOTAL KNEE ARTHROPLASTY (Right)   Consults: None  HPI: Kirk Roberts is a 73 y.o. year old male with end stage OA of his right knee with progressively worsening pain and dysfunction. He has constant pain, with activity and at rest and significant functional deficits with difficulties even with ADLs. He has had extensive non-op management including analgesics, injections of cortisone and viscosupplements, and home exercise program, but remains in significant pain with significant dysfunction. Radiographs show bone on bone arthritis medial and patellofemoral compartments. He presents now for right Total Knee Arthroplasty.   Laboratory Data: Admission on 05/12/2014  Component Date Value Ref Range Status  . Glucose-Capillary 05/12/2014 120* 70 - 99 mg/dL Final  . Comment 1 05/12/2014 Documented in Chart   Final  . Glucose-Capillary 05/12/2014 113* 70 - 99 mg/dL Final  . WBC 05/13/2014 9.7  4.0 - 10.5 K/uL Final  . RBC 05/13/2014 3.61* 4.22 - 5.81 MIL/uL Final  . Hemoglobin 05/13/2014 11.6* 13.0 - 17.0 g/dL Final  . HCT 05/13/2014 34.6* 39.0 - 52.0 % Final    . MCV 05/13/2014 95.8  78.0 - 100.0 fL Final  . MCH 05/13/2014 32.1  26.0 - 34.0 pg Final  . MCHC 05/13/2014 33.5  30.0 - 36.0 g/dL Final  . RDW 05/13/2014 12.5  11.5 - 15.5 % Final  . Platelets 05/13/2014 129* 150 - 400 K/uL Final  . Sodium 05/13/2014 138  137 - 147 mEq/L Final  . Potassium 05/13/2014 4.2  3.7 - 5.3 mEq/L Final  . Chloride 05/13/2014 101  96 - 112 mEq/L Final  . CO2 05/13/2014 23  19 - 32 mEq/L Final  . Glucose, Bld 05/13/2014 188* 70 - 99 mg/dL Final  . BUN 05/13/2014 16  6 - 23 mg/dL Final  . Creatinine, Ser 05/13/2014 1.08  0.50 - 1.35 mg/dL Final  . Calcium 05/13/2014 8.5  8.4 - 10.5 mg/dL Final  . GFR calc non Af Amer 05/13/2014 67* >90 mL/min Final  . GFR calc Af Amer 05/13/2014 78* >90 mL/min Final   Comment: (NOTE)                          The eGFR has been calculated using the CKD EPI equation.                          This calculation has not been validated in all clinical situations.  eGFR's persistently <90 mL/min signify possible Chronic Kidney                          Disease.  . Anion gap 05/13/2014 14  5 - 15 Final  . WBC 05/14/2014 11.5* 4.0 - 10.5 K/uL Final  . RBC 05/14/2014 3.42* 4.22 - 5.81 MIL/uL Final  . Hemoglobin 05/14/2014 11.0* 13.0 - 17.0 g/dL Final  . HCT 05/14/2014 32.8* 39.0 - 52.0 % Final  . MCV 05/14/2014 95.9  78.0 - 100.0 fL Final  . MCH 05/14/2014 32.2  26.0 - 34.0 pg Final  . MCHC 05/14/2014 33.5  30.0 - 36.0 g/dL Final  . RDW 05/14/2014 13.0  11.5 - 15.5 % Final  . Platelets 05/14/2014 140* 150 - 400 K/uL Final  . Sodium 05/14/2014 140  137 - 147 mEq/L Final  . Potassium 05/14/2014 4.3  3.7 - 5.3 mEq/L Final  . Chloride 05/14/2014 103  96 - 112 mEq/L Final  . CO2 05/14/2014 24  19 - 32 mEq/L Final  . Glucose, Bld 05/14/2014 159* 70 - 99 mg/dL Final  . BUN 05/14/2014 21  6 - 23 mg/dL Final  . Creatinine, Ser 05/14/2014 1.19  0.50 - 1.35 mg/dL Final  . Calcium 05/14/2014 8.9  8.4 - 10.5 mg/dL Final   . GFR calc non Af Amer 05/14/2014 60* >90 mL/min Final  . GFR calc Af Amer 05/14/2014 69* >90 mL/min Final   Comment: (NOTE)                          The eGFR has been calculated using the CKD EPI equation.                          This calculation has not been validated in all clinical situations.                          eGFR's persistently <90 mL/min signify possible Chronic Kidney                          Disease.  Georgiann Hahn gap 05/14/2014 13  5 - 15 Final  Hospital Outpatient Visit on 05/05/2014  Component Date Value Ref Range Status  . aPTT 05/05/2014 31  24 - 37 seconds Final  . WBC 05/05/2014 7.1  4.0 - 10.5 K/uL Final  . RBC 05/05/2014 4.34  4.22 - 5.81 MIL/uL Final  . Hemoglobin 05/05/2014 14.3  13.0 - 17.0 g/dL Final  . HCT 05/05/2014 42.0  39.0 - 52.0 % Final  . MCV 05/05/2014 96.8  78.0 - 100.0 fL Final  . MCH 05/05/2014 32.9  26.0 - 34.0 pg Final  . MCHC 05/05/2014 34.0  30.0 - 36.0 g/dL Final  . RDW 05/05/2014 13.1  11.5 - 15.5 % Final  . Platelets 05/05/2014 134* 150 - 400 K/uL Final  . Sodium 05/05/2014 142  137 - 147 mEq/L Final  . Potassium 05/05/2014 3.8  3.7 - 5.3 mEq/L Final  . Chloride 05/05/2014 104  96 - 112 mEq/L Final  . CO2 05/05/2014 22  19 - 32 mEq/L Final  . Glucose, Bld 05/05/2014 158* 70 - 99 mg/dL Final  . BUN 05/05/2014 17  6 - 23 mg/dL Final  . Creatinine, Ser 05/05/2014 1.12  0.50 - 1.35 mg/dL Final  . Calcium 05/05/2014  9.4  8.4 - 10.5 mg/dL Final  . Total Protein 05/05/2014 7.3  6.0 - 8.3 g/dL Final  . Albumin 05/05/2014 4.2  3.5 - 5.2 g/dL Final  . AST 05/05/2014 45* 0 - 37 U/L Final  . ALT 05/05/2014 63* 0 - 53 U/L Final  . Alkaline Phosphatase 05/05/2014 63  39 - 117 U/L Final  . Total Bilirubin 05/05/2014 0.6  0.3 - 1.2 mg/dL Final  . GFR calc non Af Amer 05/05/2014 64* >90 mL/min Final  . GFR calc Af Amer 05/05/2014 74* >90 mL/min Final   Comment: (NOTE)                          The eGFR has been calculated using the CKD EPI equation.                           This calculation has not been validated in all clinical situations.                          eGFR's persistently <90 mL/min signify possible Chronic Kidney                          Disease.  . Anion gap 05/05/2014 16* 5 - 15 Final  . Prothrombin Time 05/05/2014 14.2  11.6 - 15.2 seconds Final  . INR 05/05/2014 1.09  0.00 - 1.49 Final  . ABO/RH(D) 05/05/2014 O POS   Final  . Antibody Screen 05/05/2014 NEG   Final  . Sample Expiration 05/05/2014 05/15/2014   Final  . Color, Urine 05/05/2014 YELLOW  YELLOW Final  . APPearance 05/05/2014 CLEAR  CLEAR Final  . Specific Gravity, Urine 05/05/2014 1.023  1.005 - 1.030 Final  . pH 05/05/2014 5.5  5.0 - 8.0 Final  . Glucose, UA 05/05/2014 NEGATIVE  NEGATIVE mg/dL Final  . Hgb urine dipstick 05/05/2014 NEGATIVE  NEGATIVE Final  . Bilirubin Urine 05/05/2014 NEGATIVE  NEGATIVE Final  . Ketones, ur 05/05/2014 NEGATIVE  NEGATIVE mg/dL Final  . Protein, ur 05/05/2014 NEGATIVE  NEGATIVE mg/dL Final  . Urobilinogen, UA 05/05/2014 0.2  0.0 - 1.0 mg/dL Final  . Nitrite 05/05/2014 NEGATIVE  NEGATIVE Final  . Leukocytes, UA 05/05/2014 NEGATIVE  NEGATIVE Final   MICROSCOPIC NOT DONE ON URINES WITH NEGATIVE PROTEIN, BLOOD, LEUKOCYTES, NITRITE, OR GLUCOSE <1000 mg/dL.  Marland Kitchen MRSA, PCR 05/05/2014 NEGATIVE  NEGATIVE Final  . Staphylococcus aureus 05/05/2014 NEGATIVE  NEGATIVE Final   Comment:                                 The Xpert SA Assay (FDA                          approved for NASAL specimens                          in patients over 72 years of age),                          is one component of                          a comprehensive surveillance  program.  Test performance has                          been validated by [facility] for patients greater                          than or equal to 40 year old.                          It is not intended                          to  diagnose infection nor to                          guide or monitor treatment.  . ABO/RH(D) 05/05/2014 O POS   Final     X-Rays:No results found.  EKG:No orders found for this or any previous visit.   Hospital Course: Kirk Roberts is a 73 y.o. who was admitted to [facility]. They were brought to the operating room on 05/12/2014 and underwent Procedure(s): RIGHT TOTAL KNEE ARTHROPLASTY.  Patient tolerated the procedure well and was later transferred to the recovery room and then to the orthopaedic floor for postoperative care.  They were given PO and IV analgesics for pain control following their surgery.  They were given 24 hours of postoperative antibiotics of  Anti-infectives   Start     Dose/Rate Route Frequency Ordered Stop   05/12/14 2000  ceFAZolin (ANCEF) IVPB 2 g/50 mL premix     2 g 100 mL/hr over 30 Minutes Intravenous Every 6 hours 05/12/14 1636 05/13/14 0253   05/12/14 0600  ceFAZolin (ANCEF) 3 g in dextrose 5 % 50 mL IVPB     3 g 160 mL/hr over 30 Minutes Intravenous 30 min pre-op 05/11/14 1822 05/12/14 1340     and started on DVT prophylaxis in the form of Xarelto.   PT and OT were ordered for total joint protocol.  Discharge planning consulted to help with postop disposition and equipment needs.  Patient had a very good night on the evening of surgery.  They started to get up OOB with therapy on day one. Hemovac drain was pulled without difficulty.  Continued to work with therapy into day two.  Dressing was changed on day two and the incision was healing well.   Patient was seen in rounds by Dr. Wynelle Link and felt was ready to go to the SNF.  Insurance approved so they were set up for transfer.  Diet: Cardiac diet and Diabetic diet Activity:WBAT Follow-up:in 2 weeks on Tuesday 05/25/2014 Disposition - [facility] Discharged Condition: good       Discharge Instructions   Call MD / Call 911    Complete by:  As directed     If you experience chest pain or shortness of breath, CALL 911 and be transported to the hospital emergency room.  If you develope a fever above 101 F, pus (white drainage) or increased drainage or redness at the wound, or calf pain, call your surgeon's office.     Change dressing    Complete by:  As directed   Change dressing daily with sterile 4 x 4 inch gauze dressing and apply TED hose. Do not submerge the incision under water.     Constipation Prevention    Complete by:  As directed   Drink plenty of fluids.  Prune juice may be helpful.  You may use a stool softener, such as Colace (over the counter) 100 mg twice a day.  Use MiraLax (over the counter) for constipation as needed.     Diet - low sodium heart healthy    Complete by:  As directed      Discharge instructions    Complete by:  As directed   Pick up stool softner and laxative for home. Do not submerge incision under water. May shower. Continue to use ice for pain and swelling from surgery.  Take Xarelto for two and a half more weeks, then discontinue Xarelto. Once the patient has completed the blood thinner regimen, then take a Baby 81 mg Aspirin daily for three more weeks.  When discharged from the skilled rehab facility, please have the facility set up the patient's Craig prior to being released.  Also provide the patient with their medications at time of release from the facility to include their pain medication, the muscle relaxants, and their blood thinner medication.  If the patient is still at the rehab facility at time of follow up appointment, please also assist the patient in arranging follow up appointment in our office and any transportation needs.     Do not put a pillow under the knee. Place it under the heel.    Complete by:  As directed      Do not sit on low chairs, stoools or toilet seats, as it may be difficult to get up from low surfaces    Complete by:  As directed      Driving  restrictions    Complete by:  As directed   No driving until released by the physician.     Increase activity slowly as tolerated    Complete by:  As directed      Lifting restrictions    Complete by:  As directed   No lifting until released by the physician.     Patient may shower    Complete by:  As directed   You may shower without a dressing once there is no drainage.  Do not wash over the wound.  If drainage remains, do not shower until drainage stops.     TED hose    Complete by:  As directed   Use stockings (TED hose) for 3 weeks on both leg(s).  You may remove them at night for sleeping.     Weight bearing as tolerated    Complete by:  As directed             Medication List    STOP taking these medications       ADVIL 200 MG Caps  Generic drug:  Ibuprofen     diclofenac sodium 1 % Gel  Commonly known as:  VOLTAREN      TAKE these medications       acetaminophen 325 MG tablet  Commonly known as:  TYLENOL  Take 2 tablets (650 mg total) by mouth every 6 (six) hours as needed for mild pain (or Fever >/= 101).     bisacodyl 10 MG suppository  Commonly known as:  DULCOLAX  Place 1 suppository (10 mg total) rectally  daily as needed for moderate constipation.     DSS 100 MG Caps  Take 100 mg by mouth 2 (two) times daily.     lisinopril-hydrochlorothiazide 20-25 MG per tablet  Commonly known as:  PRINZIDE,ZESTORETIC  Take 1 tablet by mouth every morning.     lovastatin 10 MG tablet  Commonly known as:  MEVACOR  Take 10 mg by mouth at bedtime.     methocarbamol 500 MG tablet  Commonly known as:  ROBAXIN  Take 1 tablet (500 mg total) by mouth every 6 (six) hours as needed for muscle spasms.     metoCLOPramide 5 MG tablet  Commonly known as:  REGLAN  Take 1-2 tablets (5-10 mg total) by mouth every 8 (eight) hours as needed for nausea (if ondansetron (ZOFRAN) ineffective.).     ondansetron 4 MG tablet  Commonly known as:  ZOFRAN  Take 1 tablet (4 mg total) by  mouth every 6 (six) hours as needed for nausea.     oxyCODONE 5 MG immediate release tablet  Commonly known as:  Oxy IR/ROXICODONE  Take 1-2 tablets (5-10 mg total) by mouth every 3 (three) hours as needed for moderate pain, severe pain or breakthrough pain.     rivaroxaban 10 MG Tabs tablet  Commonly known as:  XARELTO  - Take 1 tablet (10 mg total) by mouth daily with breakfast. Take Xarelto for two and a half more weeks, then discontinue Xarelto.  - Once the patient has completed the blood thinner regimen, then take a Baby 81 mg Aspirin daily for three more weeks.     traMADol 50 MG tablet  Commonly known as:  ULTRAM  Take 1-2 tablets (50-100 mg total) by mouth every 6 (six) hours as needed (mild pin).     VISINE EXTRA OP  Place 1 drop into both eyes daily as needed (dry eyes/allergies.).       Follow-up Information   Follow up with Gearlean Alf, MD. Schedule an appointment as soon as possible for a visit on 05/25/2014. (Call office at [phone] to setup appointment on Tuesday 05/25/2014)    Specialty:  Orthopedic Surgery   Contact information:   [address] [place] [address] [place] [address] [phone]       Signed: Arlee Muslim, PA-C Orthopaedic Surgery 05/14/2014, 11:12 AM

## 2014-05-14 NOTE — Progress Notes (Signed)
Clinical Social Work Department CLINICAL SOCIAL WORK PLACEMENT NOTE 05/14/2014  Patient:  Kirk Roberts,Kirk Roberts  Account Number:  [id number] Admit date:  05/12/2014  Clinical Social Worker:  Cori RazorJAMIE Malick Netz, LCSW  Date/time:  05/13/2014 01:49 PM  Clinical Social Work is seeking post-discharge placement for this patient at the following level of care:      (*CSW will update this form in Epic as items are completed)     Patient/family provided with [facility] of Clinical Social Work's list of facilities offering this level of care within the geographic area requested by the patient (or if unable, by the patient's family).  05/13/2014  Patient/family informed of their freedom to choose among providers that offer the needed level of care, that participate in Medicare, Medicaid or managed care program needed by the patient, have an available bed and are willing to accept the patient.    Patient/family informed of [facility]' ownership interest in [facility], as well as of the fact that they are under no obligation to receive care at this facility.  PASARR submitted to EDS on 05/13/2014 PASARR number received on 05/13/2014  FL2 transmitted to all facilities in geographic area requested by pt/family on  05/13/2014 FL2 transmitted to all facilities within larger geographic area on   Patient informed that his/her managed care company has contracts with or will negotiate with  certain facilities, including the following:     Patient/family informed of bed offers received:  05/12/2014 Patient chooses bed at [facility] [place] Physician recommends and patient chooses bed at    [facility] [place] on  05/14/2014 Patient to be transferred to facility by FAMILY Patient and family notified of transfer on 05/14/2014 Name of family member notified:  Pt declined csw assistance.  The following physician request  were entered in Epic:   Additional Comments: Pt is in a greement with d/c to SNF today. Pt is able to transport by car. NSG reviewed d/c summary, scripts, avs. Scripts included in d/c packet.  Cori RazorJamie Shahram Alexopoulos LCSW [phone]

## 2014-05-14 NOTE — Progress Notes (Signed)
Physical Therapy Treatment Patient Details Name: Kirk DroughtRobert Roberts MRN: [id number] DOB: 1940-11-28 Today's Date: 05/14/2014    History of Present Illness R TKR    PT Comments    OOB deferred to after additional pain meds at pt request  Follow Up Recommendations  SNF     Equipment Recommendations  None recommended by PT    Recommendations for Other Services OT consult     Precautions / Restrictions Precautions Precautions: Fall;Knee Required Braces or Orthoses: Knee Immobilizer - Right Knee Immobilizer - Right: Discontinue once straight leg raise with < 10 degree lag Restrictions Weight Bearing Restrictions: No Other Position/Activity Restrictions: WBAT    Mobility  Bed Mobility                  Transfers                    Ambulation/Gait                 Stairs            Wheelchair Mobility    Modified Rankin (Stroke Patients Only)       Balance                                    Cognition Arousal/Alertness: Awake/alert Behavior During Therapy: WFL for tasks assessed/performed Overall Cognitive Status: Within Functional Limits for tasks assessed                      Exercises Total Joint Exercises Ankle Circles/Pumps: AROM;Both;10 reps;Supine Quad Sets: AROM;Both;Supine;15 reps Heel Slides: AAROM;Right;15 reps;Supine Straight Leg Raises: AAROM;Right;Supine;20 reps Goniometric ROM: AAROM R knee -10 - 45    General Comments        Pertinent Vitals/Pain Pain Assessment: 0-10 Pain Score: 8  Pain Location: R knee Pain Descriptors / Indicators: Aching;Sore Pain Intervention(s): Limited activity within patient's tolerance;Monitored during session;Ice applied;Premedicated before session;Patient requesting pain meds-RN notified    Home Living                      Prior Function            PT Goals (current goals can now be found in the care plan section) Acute Rehab PT Goals Patient  Stated Goal: Short term rehab and then home PT Goal Formulation: With patient Time For Goal Achievement: 05/20/14 Potential to Achieve Goals: Good Progress towards PT goals: Progressing toward goals    Frequency  7X/week    PT Plan Current plan remains appropriate    Co-evaluation             End of Session   Activity Tolerance: Patient limited by pain Patient left: in bed;with call bell/phone within reach     Time: 0817-0850 PT Time Calculation (min): 33 min  Charges:  $Therapeutic Exercise: 23-37 mins                    G Codes:      Kirk Roberts 05/14/2014, 12:59 PM

## 2016-03-29 ENCOUNTER — Other Ambulatory Visit: Payer: Self-pay | Admitting: Family Medicine

## 2016-03-29 DIAGNOSIS — M545 Low back pain: Secondary | ICD-10-CM

## 2016-04-02 ENCOUNTER — Ambulatory Visit
Admission: RE | Admit: 2016-04-02 | Discharge: 2016-04-02 | Disposition: A | Discharge status: Home / Self Care | Payer: Medicare Other | Source: Ambulatory Visit | Attending: Family Medicine | Admitting: Family Medicine

## 2016-04-02 DIAGNOSIS — M545 Low back pain: Secondary | ICD-10-CM

## 2016-12-03 ENCOUNTER — Ambulatory Visit (INDEPENDENT_AMBULATORY_CARE_PROVIDER_SITE_OTHER): Payer: Medicare Other | Admitting: Podiatry

## 2016-12-03 DIAGNOSIS — M129 Arthropathy, unspecified: Secondary | ICD-10-CM

## 2016-12-03 DIAGNOSIS — E1142 Type 2 diabetes mellitus with diabetic polyneuropathy: Secondary | ICD-10-CM | POA: Diagnosis not present

## 2016-12-03 DIAGNOSIS — M19071 Primary osteoarthritis, right ankle and foot: Secondary | ICD-10-CM

## 2016-12-03 NOTE — Progress Notes (Signed)
Subjective:     Patient ID: Kirk DroughtRobert Lana, male   DOB: 02-Mar-1941, 76 y.o.   MRN: [id number]  HPI of this patient presents the office per referral from the [facility]. He was seen 2 weeks ago at the [facility] and was told to come to the office today for further evaluation of a possible ingrowing toenail on the big toe. He states that he is not having any pain or discomfort at the site of the big toenails at this visit. He does relate having limitation of motion in the big toe of the right foot that he desires to be evaluated this patient is diabetic and is diet controlled. He says that he has been on gabapentin for 2 weeks and it has not helped yet. He presents the office today for an evaluation and treatment of his diabetic feet   Review of Systems     Objective:   Physical Exam GENERAL APPEARANCE: Alert, conversant. Appropriately groomed. No acute distress.  VASCULAR: Pedal pulses are  [facility].  Capillary refill time is immediate to all digits,  Normal temperature gradient.  Digital hair growth is present bilateral  NEUROLOGIC: sensation is normal to 5.07 monofilament at 5/5 sites bilateral.  Light touch is intact bilateral, Muscle strength normal.  MUSCULOSKELETAL: acceptable muscle strength, tone and stability bilateral.  Intrinsic muscluature intact bilateral.  Rectus appearance of foot and digits noted bilateral.   DERMATOLOGIC: skin color, texture, and turgor are within normal limits.  No preulcerative lesions or ulcers  are seen, no interdigital maceration noted.  No open lesions present.  Digital nails are asymptomatic. There is White subungual debris noted along the borders of both hallux toenails. No evidence of any redness, swelling or infection noted No drainage noted.      Assessment:    Diabetic neuropathy.  Onychomycosis  B/l    Plan:     IE  Discussed pathology with this patient and explained he has subungual debris noted along the nail grooves, which could given.  Impression of an ingrowing toenail debridement of the subungual debris was performed especially in the medial border of the left great toe since the patient states he was seen 2 weeks ago at the [facility]. He was given an appointment for follow-up at my office in 3 months.   Helane GuntherGregory Mayer DPM

## 2017-03-04 ENCOUNTER — Ambulatory Visit: Payer: Medicare Other | Admitting: Podiatry

## 2018-05-16 IMAGING — US US RENAL
1 series · 14 of 25 positions shown · non-contrast
Comparison: None.

CLINICAL DATA: Caps left low back pain.  Attention left kidney.

EXAM:
RENAL / URINARY TRACT ULTRASOUND COMPLETE

[Series 1: us renal · 0.32mm/px · 14 of 61 slices shown]
[im 1/61]
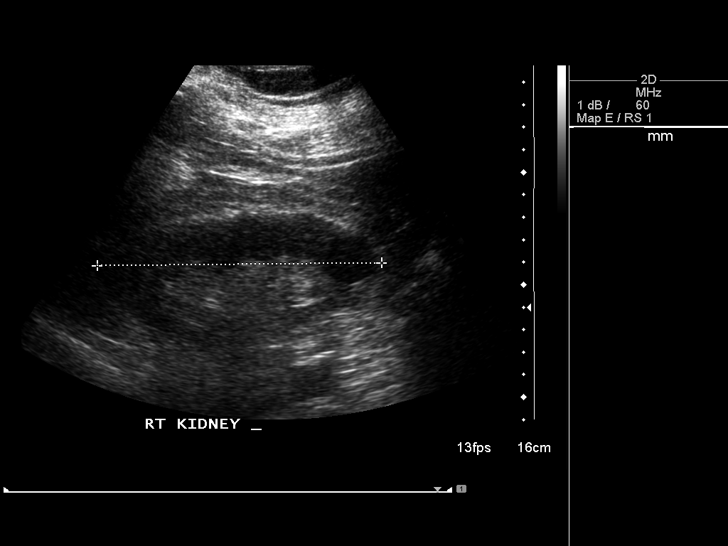
[im 6/61]
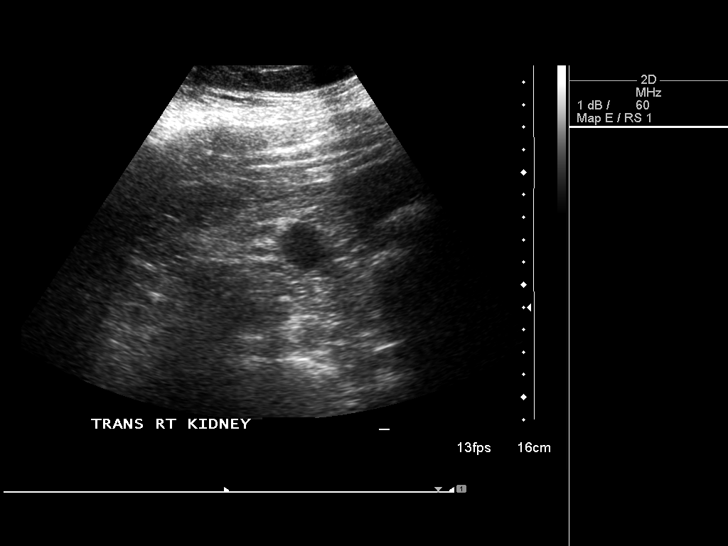
[im 11/61]
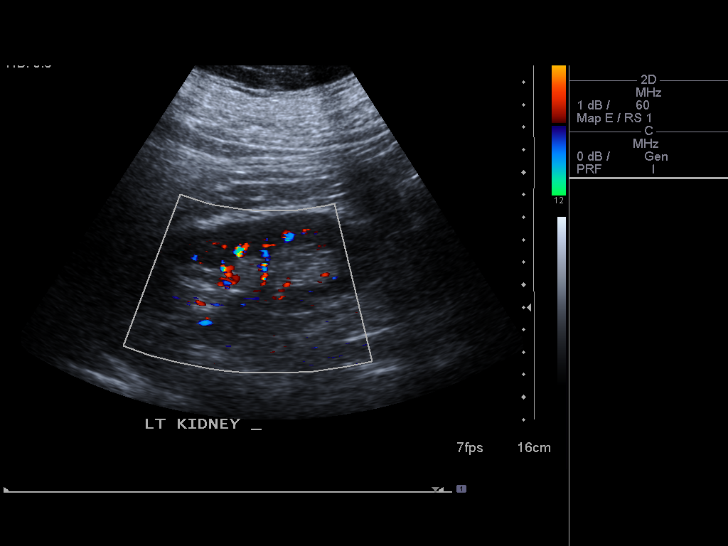
[im 16/61]
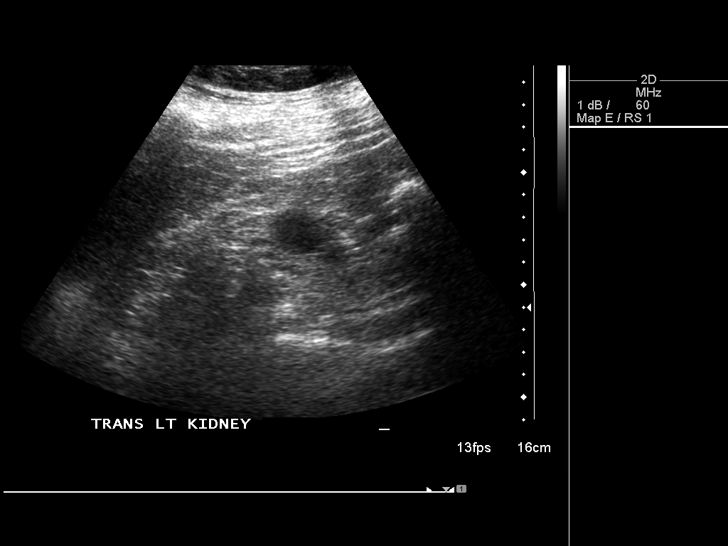
[im 21/61]
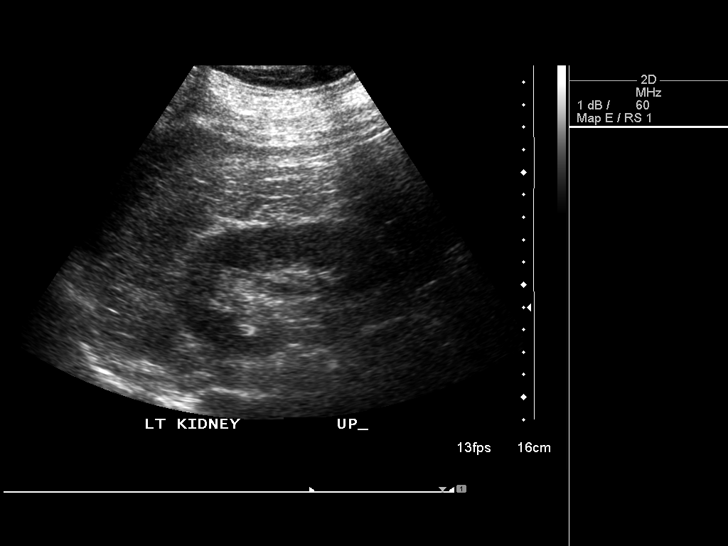
[im 23/61]
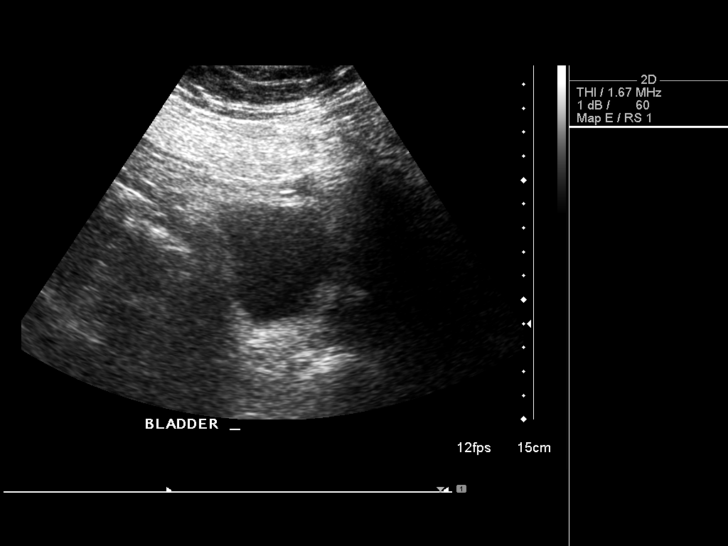
[im 28/61]
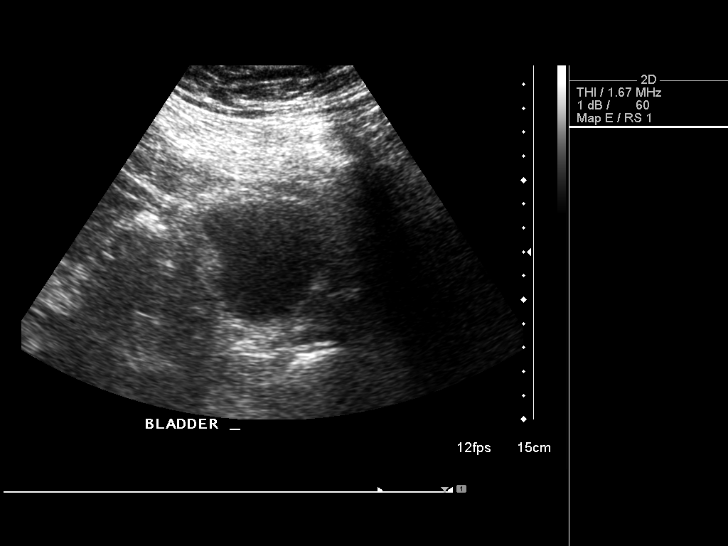
[im 33/61]
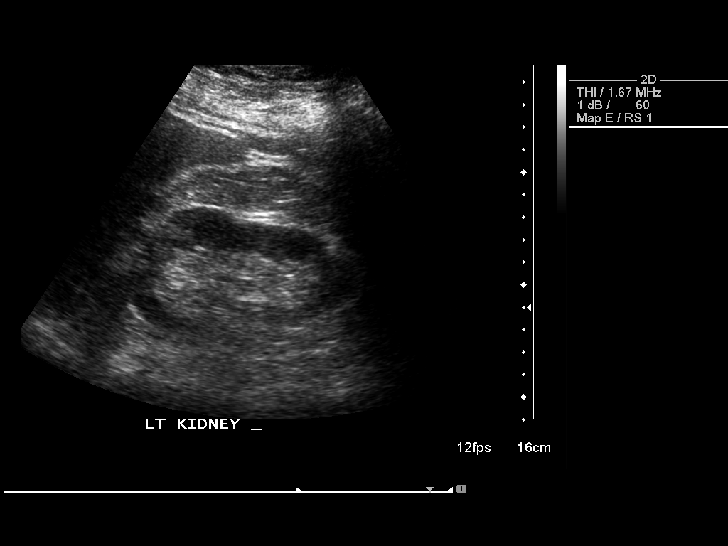
[im 38/61]
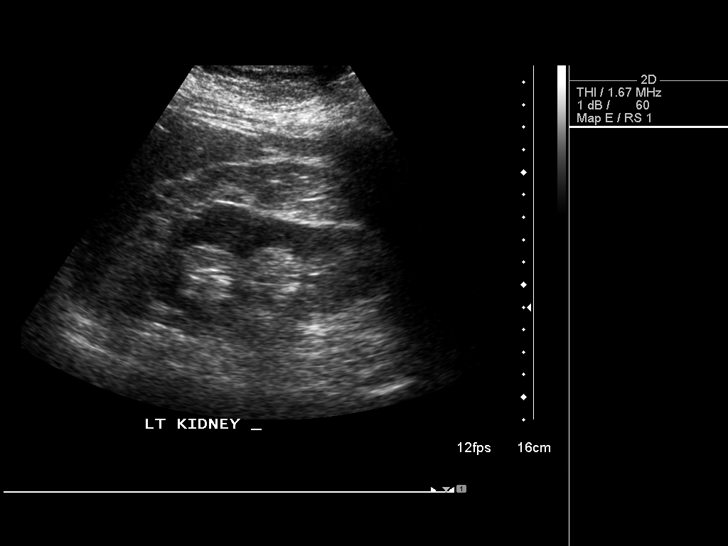
[im 41/61]
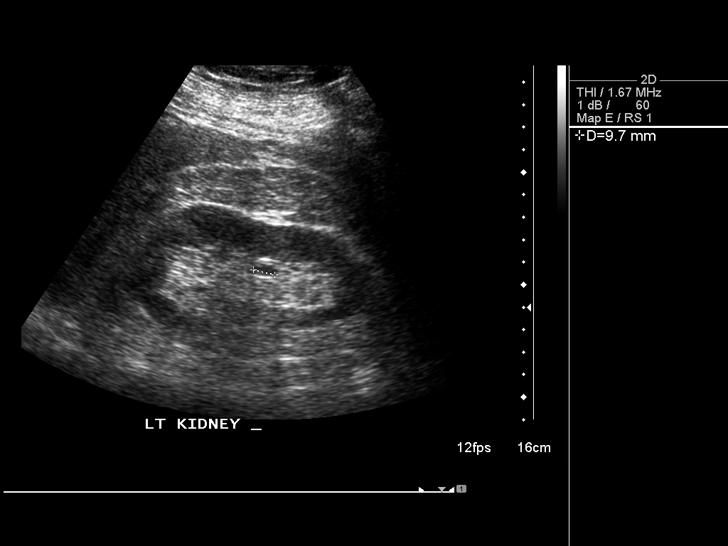
[im 46/61]
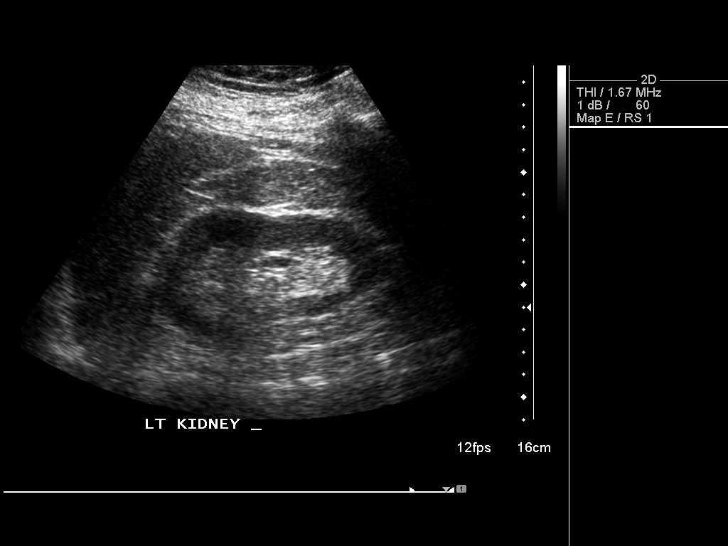
[im 51/61]
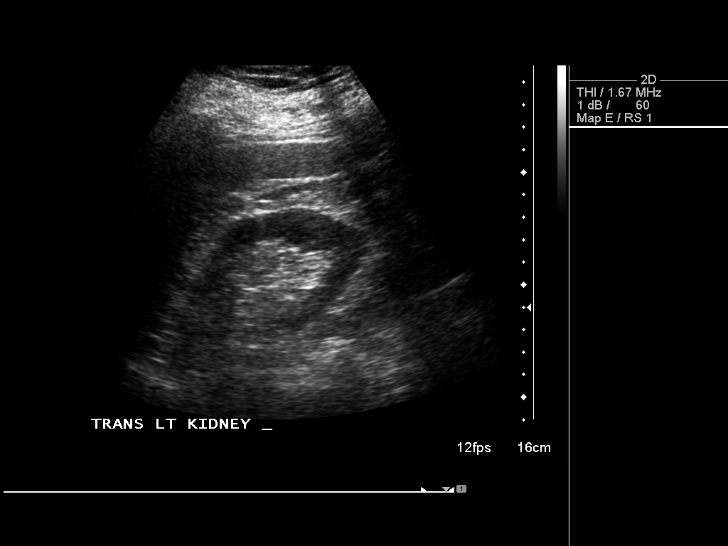
[im 56/61]
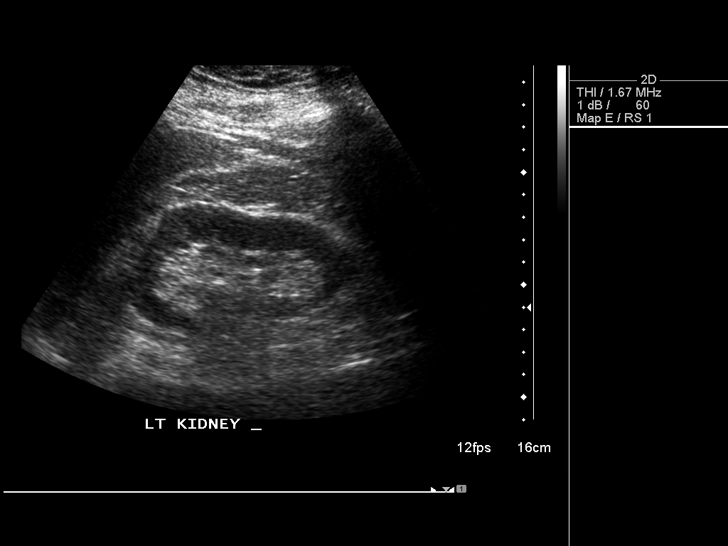
[im 61/61]
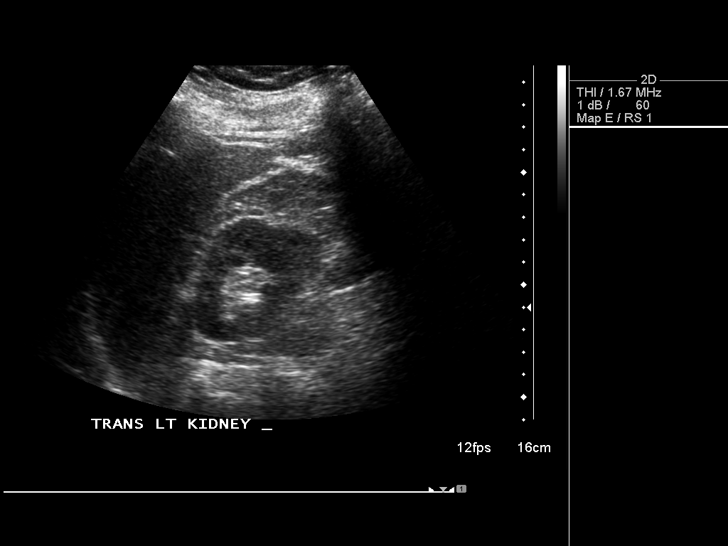

[14 of 25 positions shown; findings below may reference images not displayed]

FINDINGS: Right Kidney:

Length: 12.6 cm. 2.6 cm cyst which appears benign off the lower
pole. Echogenicity within normal limits. No mass or hydronephrosis
visualized.

Left Kidney:

Length: 11.4 cm. Tiny 10 mm parapelvic cyst in the midpole.
Echogenicity within normal limits. No mass or hydronephrosis
visualized.

Bladder:

Appears normal for degree of bladder distention.
IMPRESSION: No acute findings.  Small benign-appearing cysts bilaterally.

## 2020-12-15 ENCOUNTER — Other Ambulatory Visit: Payer: Self-pay

## 2021-02-21 ENCOUNTER — Other Ambulatory Visit: Payer: Self-pay

## 2021-02-21 ENCOUNTER — Ambulatory Visit (INDEPENDENT_AMBULATORY_CARE_PROVIDER_SITE_OTHER): Payer: No Typology Code available for payment source | Admitting: Gastroenterology

## 2021-02-21 ENCOUNTER — Encounter: Payer: Self-pay | Admitting: Gastroenterology

## 2021-02-21 VITALS — BP 131/77 | HR 101 | Temp 98.6°F | Ht 72.0 in | Wt 274.4 lb

## 2021-02-21 DIAGNOSIS — R748 Abnormal levels of other serum enzymes: Secondary | ICD-10-CM | POA: Diagnosis not present

## 2021-02-21 NOTE — Progress Notes (Signed)
Gastroenterology Consultation  Referring Provider:     Kaleen Mask, * Primary Care Physician:  Kaleen Mask, MD Primary Gastroenterologist:  Dr. Servando Snare     Reason for Consultation:     Abnormal liver tests        HPI:   Kirk Roberts is a 80 y.o. y/o adult referred for consultation & management of abnormal liver tests by Dr. Jeannetta Nap, Curly Rim, MD. This patient comes in today with a finding of abnormal findings on a work-up for thrombocytopenia.  The patient was found to have platelets of 104 and was sent for an ultrasound.  The ultrasound showed a course liver consistent with hepatocellular disease and steatosis.  The patient was also found to have splenomegaly in addition to his thrombocytopenia.  The patient denies drinking anything since 1963.  The patient also reports that he has had a history of high cholesterol but that has been under control but has always had high triglycerides and reports that he also has diabetes.  The patient reports that he is always been big but over the last 2 years he has been over 270.  His BMI at present is 37.  He denies any abdominal pain nausea vomiting fevers or chills.  The patient did not have any liver enzymes sent off but did have a CBC with normal hemoglobin and hematocrit but low platelets.  Past Medical History:  Diagnosis Date   Arthritis    oa   Back pain    at times   Diabetes mellitus without complication (HCC)    diet controlled   Hard of hearing    Heart murmur    Hypertension    Measles 1950's   Mumps 1950's   Nocturia    Shortness of breath    on exertion   Sleep apnea    no CPAP    Past Surgical History:  Procedure Laterality Date   colonscopy     left partial knee replacement  2001   TOTAL KNEE ARTHROPLASTY Right 05/12/2014   Procedure: RIGHT TOTAL KNEE ARTHROPLASTY;  Surgeon: Loanne Drilling, MD;  Location: [facility];  Service: Orthopedics;  Laterality: Right;    Prior to Admission medications    Medication Sig Start Date End Date Taking? Authorizing Provider  acetaminophen (TYLENOL) 325 MG tablet Take 2 tablets (650 mg total) by mouth every 6 (six) hours as needed for mild pain (or Fever >/= 101). 05/14/14  Yes Perkins, Alexzandrew L, PA-C  aspirin EC 81 MG tablet Take 81 mg by mouth daily. Swallow whole.   Yes [provider]  atorvastatin (LIPITOR) 20 MG tablet TAKE ONE TABLET BY MOUTH AT BEDTIME FOR CHOLESTEROL REPEAT FASTING LABS IN MID MAY . 09/30/20  Yes [provider]  Carboxymethylcellul-Glycerin (CARBOXYMETHYLCELL-GLYCERIN PF OP) Apply to eye.   Yes [provider]  Cholecalciferol 50 MCG (2000 UT) TABS Take 1 tablet by mouth daily. 09/30/20  Yes [provider]  diclofenac (VOLTAREN) 75 MG EC tablet Take 1 tablet by mouth 2 (two) times daily. 09/30/20  Yes [provider]  diclofenac Sodium (VOLTAREN) 1 % GEL APPLY 2 GRAMS TO AFFECTED AREA THREE TIMES A DAY 03/30/20  Yes [provider]  gabapentin (NEURONTIN) 300 MG capsule Take 300 mg by mouth 2 (two) times daily.   Yes [provider]  glimepiride (AMARYL) 2 MG tablet TAKE ONE TABLET BY MOUTH EVERY MORNING FOR DIABETES 09/30/20  Yes [provider]  lidocaine (LIDODERM) 5 % APPLY 1  PATCH TO SKIN AS DIRECTED BY YOUR MEDICAL PROVIDER AS NEEDED (APPLY FOR 12 HOURS, THEN REMOVE FOR 12 HOURS) PLACE 12 HOURS ON AND 12 HOURS OFF 03/30/20  Yes [provider]  lisinopril-hydrochlorothiazide (PRINZIDE,ZESTORETIC) 20-25 MG per tablet Take 1 tablet by mouth every morning.   Yes [provider]  lovastatin (MEVACOR) 10 MG tablet Take 10 mg by mouth at bedtime.    Yes [provider]  metFORMIN (GLUCOPHAGE-XR) 500 MG 24 hr tablet TAKE TWO TABLETS BY MOUTH BEFORE BREAKFAST AND TAKE ONE TABLET AT BEDTIME FOR DIABETES (ANNUAL KIDNEY FUNCTION TESTING IS NEEDED) 03/30/20  Yes [provider]  methocarbamol (ROBAXIN) 750 MG tablet TAKE ONE TABLET  BY MOUTH THREE TIMES A DAY AS NEEDED *MAY CAUSE SLEEPINESS; DO NOT DRIVE AFTER TAKING IT 60/45/40  Yes [provider]  Propylene Glycol (SYSTANE BALANCE OP) Apply to eye.   Yes [provider]  Tetrahydrozoline HCl (VISINE EXTRA OP) Place 1 drop into both eyes daily as needed (dry eyes/allergies.).   Yes [provider]  traZODone (DESYREL) 100 MG tablet Take 1 tablet by mouth at bedtime. 12/22/20  Yes [provider]  vitamin B-12 (CYANOCOBALAMIN) 500 MCG tablet Take 2 tablets by mouth daily. 01/19/21  Yes [provider]    History reviewed. No pertinent family history.   Social History   Tobacco Use   Smoking status: Some Days    Packs/day: 0.50    Years: 40.00    Pack years: 20.00    Types: Cigarettes   Smokeless tobacco: Never  Substance Use Topics   Alcohol use: No   Drug use: No    Allergies as of 02/21/2021   (No Known Allergies)    Review of Systems:    All systems reviewed and negative except where noted in HPI.   Physical Exam:  BP 131/77 (BP Location: Left Arm, Patient Position: Sitting, Cuff Size: Large)   Pulse (!) 101   Temp 98.6 F (37 C) (Oral)   Ht 6' (1.829 m)   Wt 274 lb 6.4 oz (124.5 kg)   BMI 37.22 kg/m  No LMP recorded. General:   Alert,  Well-developed, well-nourished, pleasant and cooperative in NAD Head:  Normocephalic and atraumatic. Eyes:  Sclera clear, no icterus.   Conjunctiva pink. Ears:  Normal auditory acuity. Neck:  Supple; no masses or thyromegaly. Lungs:  Respirations even and unlabored.  Clear throughout to auscultation.   No wheezes, crackles, or rhonchi. No acute distress. Heart:  Regular rate and rhythm; no murmurs, clicks, rubs, or gallops. Abdomen:  Normal bowel sounds.  No bruits.  Soft, non-tender and non-distended without masses, hepatosplenomegaly or hernias noted.  No guarding or rebound tenderness.  Negative Carnett sign.   Rectal:  Deferred.  Pulses:  Normal pulses  noted. Extremities:  No clubbing or edema.  No cyanosis. Neurologic:  Alert and oriented x3;  grossly normal neurologically. Skin:  Intact without significant lesions or rashes.  No jaundice. Lymph Nodes:  No significant cervical adenopathy. Psych:  Alert and cooperative. Normal mood and affect.  Imaging Studies: No results found.  Assessment and Plan:   Kirk Roberts is a 80 y.o. y/o adult who comes in with thrombocytopenia with splenomegaly and a nodular liver on ultrasound.  These are all consistent with significant liver disease and the patient has been explained that in light of him never being a alcoholic and not drinking since 1963 his likely diagnosis is fatty liver.  The patient has been told all  the risk factors of fatty liver.  I do not have any recent liver enzymes or past liver enzymes to see if he is having active inflammation of his liver with continued damage.  The patient will have a fibrosis score sent off via blood work and he will have his blood checked for other possible cause of abnormal liver enzymes.  The patient has been explained the plan and will be informed of the test results when they are back.  The patient agrees with the plan.    Midge Minium, MD. Clementeen Graham    Note: This dictation was prepared with Dragon dictation along with smaller phrase technology. Any transcriptional errors that result from this process are unintentional.

## 2021-02-23 LAB — HCV FIBROSURE
ALPHA 2-MACROGLOBULINS, QN: 379 mg/dL — ABNORMAL HIGH (ref 110–276)
ALT (SGPT) P5P: 38 IU/L (ref 0–55)
Apolipoprotein A-1: 142 mg/dL (ref 101–178)
Bilirubin, Total: 0.4 mg/dL (ref 0.0–1.2)
Fibrosis Score: 0.67 — ABNORMAL HIGH (ref 0.00–0.21)
GGT: 33 IU/L (ref 0–65)
Haptoglobin: 127 mg/dL (ref 34–355)
Necroinflammat Activity Score: 0.31 — ABNORMAL HIGH (ref 0.00–0.17)

## 2021-02-23 LAB — HEPATIC FUNCTION PANEL
ALT: 32 IU/L (ref 0–44)
AST: 27 IU/L (ref 0–40)
Albumin: 4.8 g/dL — ABNORMAL HIGH (ref 3.7–4.7)
Alkaline Phosphatase: 72 IU/L (ref 44–121)
Bilirubin Total: 0.6 mg/dL (ref 0.0–1.2)
Bilirubin, Direct: 0.21 mg/dL (ref 0.00–0.40)
Total Protein: 6.9 g/dL (ref 6.0–8.5)

## 2021-02-23 LAB — IRON AND TIBC
Iron Saturation: 28 % (ref 15–55)
Iron: 95 ug/dL (ref 38–169)
Total Iron Binding Capacity: 337 ug/dL (ref 250–450)
UIBC: 242 ug/dL (ref 111–343)

## 2021-02-23 LAB — HEPATITIS B SURFACE ANTIGEN: Hepatitis B Surface Ag: NEGATIVE

## 2021-02-23 LAB — HEPATITIS B SURFACE ANTIBODY,QUALITATIVE: Hep B Surface Ab, Qual: NONREACTIVE

## 2021-02-23 LAB — FERRITIN: Ferritin: 200 ng/mL (ref 30–400)

## 2021-02-23 LAB — CERULOPLASMIN: Ceruloplasmin: 16.2 mg/dL (ref 16.0–31.0)

## 2021-02-23 LAB — ALPHA-1-ANTITRYPSIN: A-1 Antitrypsin: 131 mg/dL (ref 101–187)

## 2021-02-23 LAB — ANTI-SMOOTH MUSCLE ANTIBODY, IGG: Smooth Muscle Ab: 14 Units (ref 0–19)

## 2021-02-23 LAB — HEPATITIS A ANTIBODY, TOTAL: hep A Total Ab: NEGATIVE

## 2021-02-23 LAB — ANA: Anti Nuclear Antibody (ANA): NEGATIVE

## 2021-03-01 LAB — SPECIMEN STATUS REPORT

## 2021-03-01 LAB — HEPATITIS C ANTIBODY: Hep C Virus Ab: <0.1 s/co ratio (ref 0.0–0.9)

## 2021-03-07 ENCOUNTER — Telehealth: Payer: Self-pay

## 2021-03-07 NOTE — Telephone Encounter (Signed)
-----   Message from Midge Minium, MD sent at 02/24/2021  5:03 PM EDT ----- Let the patient know that he needs a vaccination for hepatitis a and hepatitis B because he is not immune to it.  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 I do not see that a hepatitis C was sent off when he should have this checked.  Please let the patient know that his liver enzymes have come back to normal which is a good sign meeting that there is no active inflammation occurring.

## 2021-03-07 NOTE — Telephone Encounter (Signed)
Pt notified of lab results. [facility] the Twinrix vaccine per pt request.

## 2021-03-14 ENCOUNTER — Encounter: Payer: Self-pay | Admitting: *Deleted

## 2021-10-03 ENCOUNTER — Ambulatory Visit (INDEPENDENT_AMBULATORY_CARE_PROVIDER_SITE_OTHER): Payer: No Typology Code available for payment source | Admitting: Vascular Surgery

## 2021-10-03 ENCOUNTER — Encounter (INDEPENDENT_AMBULATORY_CARE_PROVIDER_SITE_OTHER): Payer: Self-pay | Admitting: Vascular Surgery

## 2021-10-03 ENCOUNTER — Other Ambulatory Visit: Payer: Self-pay

## 2021-10-03 DIAGNOSIS — I10 Essential (primary) hypertension: Secondary | ICD-10-CM | POA: Diagnosis not present

## 2021-10-03 DIAGNOSIS — I7143 Infrarenal abdominal aortic aneurysm, without rupture: Secondary | ICD-10-CM

## 2021-10-03 DIAGNOSIS — E119 Type 2 diabetes mellitus without complications: Secondary | ICD-10-CM

## 2021-10-03 NOTE — Progress Notes (Signed)
? ? ?Patient ID: Kirk Roberts, adult   DOB: [birth year], 81 y.o.   MRN: [id number] ? ?Chief Complaint  ?Patient presents with  ? New Patient (Initial Visit)  ?  Ref Green consult for AAA 4.1 cm  ? ? ?HPI ?Kirk DroughtRobert Roberts is a 81 y.o. adult.  I am asked to see the patient by Dr. Chilton SiGreen for evaluation of an AAA.  The patient says he has known of the aneurysm for several years.  He has no aneurysm related symptoms. Specifically, the patient denies new back or abdominal pain, or signs of peripheral embolization.  Several weeks ago, he had a CT angiogram done at another institution that I do not have immediately available for review of the images, but I do have the results.  [facility].  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 ? ? ?Past Medical History:  ?Diagnosis Date  ? Arthritis   ? oa  ? Back pain   ? at times  ? Diabetes mellitus without complication (HCC)   ? diet controlled  ? Hard of hearing   ? Heart murmur   ? Hypertension   ? Measles 1950's  ? Mumps 1950's  ? Nocturia   ? Shortness of breath   ? on exertion  ? Sleep apnea   ? no CPAP  ? ? ?Past Surgical History:  ?Procedure Laterality Date  ? colonscopy    ? left partial knee replacement  2001  ? TOTAL KNEE ARTHROPLASTY Right 05/12/2014  ? Procedure: RIGHT TOTAL KNEE ARTHROPLASTY;  Surgeon: Loanne DrillingFrank Aluisio V, MD;  Location: [facility];  Service: Orthopedics;  Laterality: Right;  ? ? ? ?Family History ?No bleeding disorders, clotting disorders, autoimmune diseases, or aneurysms ? ? ?Social History  ? ?Tobacco Use  ? Smoking status: Some Days  ?  Packs/day: 0.50  ?  Years: 40.00  ?  Pack years: 20.00  ?  Types: Cigarettes  ? Smokeless tobacco: Never  ?Substance Use Topics  ? Alcohol use: No  ? Drug use: No  ? ? ? ?No Known Allergies ? ?Current Outpatient Medications  ?Medication Sig Dispense Refill  ?  atorvastatin (LIPITOR) 20 MG tablet TAKE ONE TABLET BY MOUTH AT BEDTIME FOR CHOLESTEROL REPEAT FASTING LABS IN MID MAY .    ? Carboxymethylcellul-Glycerin (CARBOXYMETHYLCELL-GLYCERIN PF OP) Apply to eye.    ? Cholecalciferol 50 MCG (2000 UT) TABS Take 1 tablet by mouth daily.    ? diclofenac Sodium (VOLTAREN) 1 % GEL APPLY 2 GRAMS TO AFFECTED AREA THREE TIMES A DAY    ? glimepiride (AMARYL) 2 MG tablet TAKE ONE TABLET BY MOUTH EVERY MORNING FOR DIABETES    ? lidocaine (LIDODERM) 5 % APPLY 1 PATCH TO SKIN AS DIRECTED BY YOUR MEDICAL PROVIDER AS NEEDED (APPLY FOR 12 HOURS, THEN REMOVE FOR 12 HOURS) PLACE 12 HOURS ON AND 12 HOURS OFF    ? lisinopril-hydrochlorothiazide (ZESTORETIC) 20-12.5 MG tablet Take 1 tablet by mouth every morning.    ? metFORMIN (GLUCOPHAGE-XR) 500 MG 24 hr tablet TAKE TWO TABLETS BY MOUTH BEFORE BREAKFAST AND TAKE ONE TABLET AT BEDTIME FOR DIABETES (ANNUAL KIDNEY FUNCTION TESTING IS NEEDED)    ? methocarbamol (ROBAXIN) 750 MG tablet TAKE ONE TABLET BY MOUTH THREE TIMES A DAY AS NEEDED *MAY CAUSE SLEEPINESS; DO NOT DRIVE AFTER TAKING IT    ?  Propylene Glycol (SYSTANE BALANCE OP) Apply to eye.    ? Tetrahydrozoline HCl (VISINE EXTRA OP) Place 1 drop into both eyes daily as needed (dry eyes/allergies.).    ? traZODone (DESYREL) 100 MG tablet Take 1 tablet by mouth at bedtime.    ? vitamin B-12 (CYANOCOBALAMIN) 500 MCG tablet Take 2 tablets by mouth daily.    ? acetaminophen (TYLENOL) 325 MG tablet Take 2 tablets (650 mg total) by mouth every 6 (six) hours as needed for mild pain (or Fever >/= 101). (Patient not taking: Reported on 10/03/2021) 40 tablet 0  ? aspirin EC 81 MG tablet Take 81 mg by mouth daily. Swallow whole. (Patient not taking: Reported on 10/03/2021)    ? diclofenac (VOLTAREN) 75 MG EC tablet Take 1 tablet by mouth 2 (two) times daily. (Patient not taking: Reported on 10/03/2021)    ? gabapentin (NEURONTIN) 300 MG capsule Take 300 mg by mouth 2 (two) times daily. (Patient not taking:  Reported on 10/03/2021)    ? lovastatin (MEVACOR) 10 MG tablet Take 10 mg by mouth at bedtime.  (Patient not taking: Reported on 10/03/2021)    ? ?No current facility-administered medications for this visit.  ? ? ? ? ?REVIEW OF SYSTEMS (Negative unless checked) ? ?Constitutional: [] Weight loss  [] Fever  [] Chills ?Cardiac: [] Chest pain   [] Chest pressure   [] Palpitations   [] Shortness of breath when laying flat   [] Shortness of breath at rest   [x] Shortness of breath with exertion. ?Vascular:  [] Pain in legs with walking   [] Pain in legs at rest   [] Pain in legs when laying flat   [] Claudication   [] Pain in feet when walking  [] Pain in feet at rest  [] Pain in feet when laying flat   [] History of DVT   [] Phlebitis   [] Swelling in legs   [] Varicose veins   [] Non-healing ulcers ?Pulmonary:   [] Uses home oxygen   [] Productive cough   [] Hemoptysis   [] Wheeze  [] COPD   [] Asthma ?Neurologic:  [] Dizziness  [] Blackouts   [] Seizures   [] History of stroke   [] History of TIA  [] Aphasia   [] Temporary blindness   [] Dysphagia   [] Weakness or numbness in arms   [] Weakness or numbness in legs ?Musculoskeletal:  [x] Arthritis   [] Joint swelling   [] Joint pain   [x] Low back pain ?Hematologic:  [] Easy bruising  [] Easy bleeding   [] Hypercoagulable state   [] Anemic  [] Hepatitis ?Gastrointestinal:  [] Blood in stool   [] Vomiting blood  [] Gastroesophageal reflux/heartburn   [] Abdominal pain ?Genitourinary:  [] Chronic kidney disease   [] Difficult urination  [] Frequent urination  [] Burning with urination   [] Hematuria ?Skin:  [] Rashes   [] Ulcers   [] Wounds ?Psychological:  [] History of anxiety   []  History of major depression. ? ? ? ?Physical Exam ?BP 116/74 (BP Location: Left Arm)   Pulse 93   Resp 16   Ht 6\' 1"  (1.854 m)   Wt 269 lb (122 kg)   BMI 35.49 kg/m?  ?Gen:  WD/WN, NAD.  Appears younger than stated age ?Head: [place]/AT, No temporalis wasting.  ?Ear/Nose/Throat: Hearing somewhat diminished, nares w/o erythema or drainage, oropharynx  w/o Erythema/Exudate ?Eyes: Conjunctiva clear, sclera non-icteric  ?Neck: trachea midline.  No JVD.  ?Pulmonary:  Good air movement, respirations not labored, no use of accessory muscles  ?Cardiac: RRR, no JVD ?Vascular:  ?Vessel Right Left  ?Radial Palpable Palpable  ?    ?    ?    ?    ?    ?    ?    ?    ? ?  Gastrointestinal:. No masses, surgical incisions, or scars.  Aorta not palpable due to body habitus ?Musculoskeletal: M/S 5/5 throughout.  Extremities without ischemic changes.  No deformity or atrophy.  Trace lower extremity edema. ?Neurologic: Sensation grossly intact in extremities.  Symmetrical.  Speech is fluent. Motor exam as listed above. ?Psychiatric: Judgment intact, Mood & affect appropriate for pt's clinical situation. ?Dermatologic: No rashes or ulcers noted.  No cellulitis or open wounds. ? ? ? ?Radiology ?No results found. ? ?Labs ?No results found for this or any previous visit (from the past 2160 hour(s)). ? ?Assessment/Plan: ? ?AAA (abdominal aortic aneurysm) without rupture ?Several weeks ago, he had a CT angiogram done at another institution that I do not have immediately available for review of the images, but I do have the results.  This was done at [facility] in January of this year.  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 ? ?No surgery or intervention at this time. ?The patient has an asymptomatic abdominal aortic aneurysm that is greater than 4 cm but less than 5 cm in maximal diameter.  I have discussed the natural history of abdominal aortic aneurysm and the small risk of rupture for aneurysm less than 5 cm in size.  However, as these small aneurysms tend to enlarge over time, continued surveillance with ultrasound or CT scan is mandatory.  ?I have also discussed optimizing medical management with hypertension and lipid control and the importance of abstinence  from tobacco.  The patient is also encouraged to exercise a minimum of 30 minutes 4 times a week.  ?Should the patient develop new onset abdominal or back pain or signs of peripheral embolization they are instruc

## 2021-10-05 DIAGNOSIS — I714 Abdominal aortic aneurysm, without rupture, unspecified: Secondary | ICD-10-CM | POA: Insufficient documentation

## 2021-10-05 DIAGNOSIS — E118 Type 2 diabetes mellitus with unspecified complications: Secondary | ICD-10-CM | POA: Insufficient documentation

## 2021-10-05 DIAGNOSIS — E119 Type 2 diabetes mellitus without complications: Secondary | ICD-10-CM | POA: Insufficient documentation

## 2021-10-05 DIAGNOSIS — I10 Essential (primary) hypertension: Secondary | ICD-10-CM | POA: Insufficient documentation

## 2021-10-05 NOTE — Patient Instructions (Signed)
Abdominal Aortic Aneurysm  An aneurysm is a bulge in a blood vessel that carries blood away from the heart (artery). It happens when blood pushes against a weak or damaged place on the wall of the blood vessel. An abdominal aortic aneurysm happens in the main blood vessel that carries blood away from the heart (aorta). Most aneurysms do not cause problems, but some do cause problems. If an aneurysm grows, it can burst or tear. This causes bleeding inside you. It is anemergency. It can be life-threatening. What are the causes? The exact cause of this condition is not known. What increases the risk? The following factors may make you more likely to develop this condition: Being male and 60 years of age or older. Being of [place] European descent. Using nicotine or tobacco now or in the past. Having a family history of aneurysms. Having any of these problems: Hardening of blood vessels that carry blood away from the heart. Irritation and swelling of the walls of blood vessels that carry blood away from the heart. Certain genetic problems. Being very overweight. An infection in the wall of your aorta. High cholesterol. High blood pressure (hypertension). What are the signs or symptoms? Symptoms depend on the size of your aneurysm and how fast it is growing. Most aneurysms grow slowly and do not cause symptoms. If symptoms happen, you may: Have very bad pain in your belly (abdomen), side, or low back. Feel full after eating only a little food. Feel a throbbing lump in your belly. Have these problems with your feet or toes: Pain. Skin turning blue. Sores. Have trouble pooping (constipation). Have trouble peeing (urinating). If your aneurysm bursts, you may: Feel sudden, very bad pain in the belly, side, or back. Feel like you may vomit. Vomit. Feel light-headed. Faint. How is this treated? Treatment for this condition depends on: The size of your aneurysm. How fast it is  growing. Your age. Your risk of having the aneurysm burst. If your aneurysm is smaller than 2 inches (5 cm), your doctor may: Check it often to see if it is growing. You may have an imaging test (ultrasound) to check it every 3-6 months, every year, or every few years. Give you medicines for: High blood pressure. Pain. Infection. If your aneurysm is larger than 2 inches (5 cm), you may need surgery to fix it. Follow these instructions at home: Eating and drinking  Eat a heart-healthy diet. Eat a lot of: Fresh fruits and vegetables. Whole grains. Low-fat (lean) protein. Low-fat dairy products. Avoid foods that are high in saturated fat and cholesterol. These foods include red meat and some dairy products.  Lifestyle     Do not use any products that contain nicotine or tobacco, such as cigarettes, e-cigarettes, and chewing tobacco. If you need help quitting, ask your doctor. Stay active and get exercise. Ask your doctor how often to exercise and what types of exercise are safe for you. Keep a healthy weight. Alcohol use Do not drink alcohol if: Your doctor tells you not to drink. You are pregnant, may be pregnant, or are planning to become pregnant. If you drink alcohol: Limit how much you use to: 0-1 drink a day for women. 0-2 drinks a day for men. Be aware of how much alcohol is in your drink. In the [place] one drink equals one 12 oz bottle of beer (355 mL), one 5 oz glass of wine (148 mL), or one 1 oz glass of hard liquor (44 mL). General instructions   Take over-the-counter and prescription medicines only as told by your doctor. Keep your blood pressure in a normal range. Check it at regular times. Ask your doctor what level it should be. Have regular checks of your levels of blood sugar (glucose) and cholesterol. Follow steps to keep these levels near normal. Avoid heavy lifting and activities that take a lot of effort. Ask what activities are safe for you. If you can, learn  your family's health history. Keep all follow-up visits as told by your doctor. This is important. Contact a doctor if: Your belly, side, or back hurts. Your belly throbs. You have a fever. Get help right away if: You have sudden, bad pain in your belly, side, or back. You feel like you may vomit or you vomit. You feel light-headed or you faint. Your heart beats fast when you stand. You have sweaty skin that is cold to the touch (clammy). You are short of breath. You have trouble pooping. You have trouble peeing. These symptoms may be an emergency. Do not wait to see if the symptoms will go away. Get medical help right away. Call your local emergency services (911 in the [place] Do not drive yourself to the hospital. Summary An aneurysm is a bulge in one of the blood vessels that carry blood away from the heart (artery). An abdominal aortic aneurysm happens in the main blood vessel that carries blood away from the heart (aorta). This condition can cause bleeding inside the body. It can be life-threatening. Risk can rise if you are male, age 60 or older, and of [place] descent. Risk can also rise from nicotine or tobacco use or having aneurysms in the family. Get help right away if you have symptoms of a burst aneurysm. This information is not intended to replace advice given to you by your health care provider. Make sure you discuss any questions you have with your healthcare provider. Document Revised: 04/17/2019 Document Reviewed: 04/17/2019 Elsevier Patient Education  2022 Elsevier Inc.  

## 2021-10-05 NOTE — Assessment & Plan Note (Signed)
Several weeks ago, he had a CT angiogram done at another institution that I do not have immediately available for review of the images, but I do have the results.  This was done at [facility] in January of this year.  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 ? ?No surgery or intervention at this time. ?The patient has an asymptomatic abdominal aortic aneurysm that is greater than 4 cm but less than 5 cm in maximal diameter.  I have discussed the natural history of abdominal aortic aneurysm and the small risk of rupture for aneurysm less than 5 cm in size.  However, as these small aneurysms tend to enlarge over time, continued surveillance with ultrasound or CT scan is mandatory.  ?I have also discussed optimizing medical management with hypertension and lipid control and the importance of abstinence from tobacco.  The patient is also encouraged to exercise a minimum of 30 minutes 4 times a week.  ?Should the patient develop new onset abdominal or back pain or signs of peripheral embolization they are instructed to seek medical attention immediately and to alert the physician providing care that they have an aneurysm.  ?The patient voices their understanding. ?I have scheduled the patient to return in 6 months with an aortic duplex. ?

## 2021-10-05 NOTE — Assessment & Plan Note (Signed)
blood pressure control important in reducing the progression of atherosclerotic disease and aneurysmal growth. On appropriate oral medications.  

## 2021-10-05 NOTE — Assessment & Plan Note (Signed)
blood glucose control important in reducing the progression of atherosclerotic disease. Also, involved in wound healing. Diet controlled  

## 2021-11-21 ENCOUNTER — Observation Stay (HOSPITAL_COMMUNITY): Payer: No Typology Code available for payment source

## 2021-11-21 ENCOUNTER — Emergency Department (HOSPITAL_COMMUNITY): Payer: No Typology Code available for payment source

## 2021-11-21 ENCOUNTER — Inpatient Hospital Stay (HOSPITAL_COMMUNITY)
Admission: EM | Admit: 2021-11-21 | Discharge: 2021-11-23 | DRG: 247 | Disposition: A | Discharge status: Home / Self Care | Payer: No Typology Code available for payment source | Attending: Cardiovascular Disease | Admitting: Cardiovascular Disease

## 2021-11-21 DIAGNOSIS — I25110 Atherosclerotic heart disease of native coronary artery with unstable angina pectoris: Secondary | ICD-10-CM | POA: Diagnosis present

## 2021-11-21 DIAGNOSIS — R0789 Other chest pain: Secondary | ICD-10-CM | POA: Diagnosis not present

## 2021-11-21 DIAGNOSIS — I350 Nonrheumatic aortic (valve) stenosis: Secondary | ICD-10-CM

## 2021-11-21 DIAGNOSIS — F17210 Nicotine dependence, cigarettes, uncomplicated: Secondary | ICD-10-CM | POA: Diagnosis present

## 2021-11-21 DIAGNOSIS — E669 Obesity, unspecified: Secondary | ICD-10-CM | POA: Diagnosis present

## 2021-11-21 DIAGNOSIS — I2510 Atherosclerotic heart disease of native coronary artery without angina pectoris: Secondary | ICD-10-CM

## 2021-11-21 DIAGNOSIS — I714 Abdominal aortic aneurysm, without rupture, unspecified: Secondary | ICD-10-CM | POA: Diagnosis present

## 2021-11-21 DIAGNOSIS — I10 Essential (primary) hypertension: Secondary | ICD-10-CM | POA: Diagnosis present

## 2021-11-21 DIAGNOSIS — I7143 Infrarenal abdominal aortic aneurysm, without rupture: Secondary | ICD-10-CM | POA: Diagnosis present

## 2021-11-21 DIAGNOSIS — I214 Non-ST elevation (NSTEMI) myocardial infarction: Secondary | ICD-10-CM | POA: Diagnosis not present

## 2021-11-21 DIAGNOSIS — Z79899 Other long term (current) drug therapy: Secondary | ICD-10-CM

## 2021-11-21 DIAGNOSIS — Z8679 Personal history of other diseases of the circulatory system: Secondary | ICD-10-CM

## 2021-11-21 DIAGNOSIS — I209 Angina pectoris, unspecified: Principal | ICD-10-CM

## 2021-11-21 DIAGNOSIS — E118 Type 2 diabetes mellitus with unspecified complications: Secondary | ICD-10-CM

## 2021-11-21 DIAGNOSIS — N182 Chronic kidney disease, stage 2 (mild): Secondary | ICD-10-CM | POA: Diagnosis present

## 2021-11-21 DIAGNOSIS — Z955 Presence of coronary angioplasty implant and graft: Secondary | ICD-10-CM

## 2021-11-21 DIAGNOSIS — Z96651 Presence of right artificial knee joint: Secondary | ICD-10-CM | POA: Diagnosis present

## 2021-11-21 DIAGNOSIS — Z7984 Long term (current) use of oral hypoglycemic drugs: Secondary | ICD-10-CM

## 2021-11-21 DIAGNOSIS — E1122 Type 2 diabetes mellitus with diabetic chronic kidney disease: Secondary | ICD-10-CM | POA: Diagnosis present

## 2021-11-21 DIAGNOSIS — E119 Type 2 diabetes mellitus without complications: Secondary | ICD-10-CM

## 2021-11-21 DIAGNOSIS — R911 Solitary pulmonary nodule: Secondary | ICD-10-CM | POA: Diagnosis present

## 2021-11-21 DIAGNOSIS — R778 Other specified abnormalities of plasma proteins: Secondary | ICD-10-CM

## 2021-11-21 DIAGNOSIS — E785 Hyperlipidemia, unspecified: Secondary | ICD-10-CM | POA: Diagnosis present

## 2021-11-21 DIAGNOSIS — I129 Hypertensive chronic kidney disease with stage 1 through stage 4 chronic kidney disease, or unspecified chronic kidney disease: Secondary | ICD-10-CM | POA: Diagnosis present

## 2021-11-21 LAB — CBC
HCT: 41.2 % (ref 39.0–52.0)
Hemoglobin: 14 g/dL (ref 13.0–17.0)
MCH: 32.8 pg (ref 26.0–34.0)
MCHC: 34 g/dL (ref 30.0–36.0)
MCV: 96.5 fL (ref 80.0–100.0)
Platelets: 114 10*3/uL — ABNORMAL LOW (ref 150–400)
RBC: 4.27 MIL/uL (ref 4.22–5.81)
RDW: 14 % (ref 11.5–15.5)
WBC: 5.7 10*3/uL (ref 4.0–10.5)
nRBC: 0 % (ref 0.0–0.2)

## 2021-11-21 LAB — BASIC METABOLIC PANEL
Anion gap: 11 (ref 5–15)
BUN: 12 mg/dL (ref 8–23)
CO2: 22 mmol/L (ref 22–32)
Calcium: 9.5 mg/dL (ref 8.9–10.3)
Chloride: 106 mmol/L (ref 98–111)
Creatinine, Ser: 1.33 mg/dL — ABNORMAL HIGH (ref 0.61–1.24)
GFR, Estimated: 55 mL/min — ABNORMAL LOW (ref >60)
Glucose, Bld: 173 mg/dL — ABNORMAL HIGH (ref 70–99)
Potassium: 3.7 mmol/L (ref 3.5–5.1)
Sodium: 139 mmol/L (ref 135–145)

## 2021-11-21 LAB — TROPONIN I (HIGH SENSITIVITY)
Troponin I (High Sensitivity): 572 ng/L (ref <18)
Troponin I (High Sensitivity): 914 ng/L (ref <18)

## 2021-11-21 LAB — CBG MONITORING, ED
Glucose-Capillary: 180 mg/dL — ABNORMAL HIGH (ref 70–99)
Glucose-Capillary: 133 mg/dL — ABNORMAL HIGH (ref 70–99)

## 2021-11-21 MED ORDER — ASPIRIN 81 MG PO CHEW: 324.0000 mg | CHEWABLE_TABLET | Freq: Once | ORAL | Status: DC

## 2021-11-21 MED ORDER — ONDANSETRON HCL 4 MG/2ML IJ SOLN: 4.0000 mg | Freq: Four times a day (QID) | INTRAMUSCULAR | Status: DC | PRN

## 2021-11-21 MED ORDER — NITROGLYCERIN 0.4 MG SL SUBL: 0.4000 mg | SUBLINGUAL_TABLET | SUBLINGUAL | Status: DC | PRN

## 2021-11-21 MED ORDER — ACETAMINOPHEN 325 MG PO TABS: 650.0000 mg | ORAL_TABLET | Freq: Four times a day (QID) | ORAL | Status: DC | PRN

## 2021-11-21 MED ORDER — ACETAMINOPHEN 325 MG PO TABS: 650.0000 mg | ORAL_TABLET | ORAL | Status: DC | PRN

## 2021-11-21 MED ORDER — ATORVASTATIN CALCIUM 80 MG PO TABS
80.0000 mg | ORAL_TABLET | Freq: Every day | ORAL | Status: DC
  Administered 2021-11-21 – 2021-11-23 (×3): 80 mg via ORAL
  Filled 2021-11-21 (×3): qty 1

## 2021-11-21 MED ORDER — TRAZODONE HCL 100 MG PO TABS
100.0000 mg | ORAL_TABLET | Freq: Every day | ORAL | Status: DC
  Administered 2021-11-21 – 2021-11-22 (×2): 100 mg via ORAL
  Filled 2021-11-21: qty 1
  Filled 2021-11-21: qty 2

## 2021-11-21 MED ORDER — IOHEXOL 350 MG/ML SOLN
100.0000 mL | Freq: Once | INTRAVENOUS | Status: AC | PRN
  Administered 2021-11-21: 100 mL via INTRAVENOUS

## 2021-11-21 MED ORDER — ASPIRIN EC 81 MG PO TBEC
81.0000 mg | DELAYED_RELEASE_TABLET | Freq: Every day | ORAL | Status: DC
  Administered 2021-11-22 – 2021-11-23 (×2): 81 mg via ORAL
  Filled 2021-11-21 (×2): qty 1

## 2021-11-21 MED ORDER — GLIMEPIRIDE 2 MG PO TABS
2.0000 mg | ORAL_TABLET | Freq: Every day | ORAL | Status: DC
  Administered 2021-11-23: 2 mg via ORAL
  Filled 2021-11-21 (×2): qty 1

## 2021-11-21 MED ORDER — CARVEDILOL 12.5 MG PO TABS
12.5000 mg | ORAL_TABLET | Freq: Two times a day (BID) | ORAL | Status: DC
Start: 2021-11-21 — End: 2021-11-23
  Administered 2021-11-21 – 2021-11-23 (×4): 12.5 mg via ORAL
  Filled 2021-11-21 (×5): qty 1

## 2021-11-21 MED ORDER — SODIUM CHLORIDE 0.9 % IV BOLUS
1000.0000 mL | Freq: Once | INTRAVENOUS | Status: AC
  Administered 2021-11-21: 1000 mL via INTRAVENOUS

## 2021-11-21 MED ORDER — INSULIN ASPART 100 UNIT/ML IJ SOLN
0.0000 [IU] | Freq: Three times a day (TID) | INTRAMUSCULAR | Status: DC
  Administered 2021-11-21: 3 [IU] via SUBCUTANEOUS

## 2021-11-21 MED ORDER — HEPARIN (PORCINE) 25000 UT/250ML-% IV SOLN
1450.0000 [IU]/h | INTRAVENOUS | Status: DC
  Administered 2021-11-21: 1300 [IU]/h via INTRAVENOUS
  Administered 2021-11-22: 1450 [IU]/h via INTRAVENOUS
  Filled 2021-11-21 (×2): qty 250

## 2021-11-21 MED ORDER — HEPARIN BOLUS VIA INFUSION
4000.0000 [IU] | Freq: Once | INTRAVENOUS | Status: AC
  Administered 2021-11-21: 4000 [IU] via INTRAVENOUS
  Filled 2021-11-21: qty 4000

## 2021-11-21 MED ORDER — NITROGLYCERIN IN D5W 200-5 MCG/ML-% IV SOLN
0.0000 ug/min | INTRAVENOUS | Status: DC
  Administered 2021-11-21: 5 ug/min via INTRAVENOUS
  Filled 2021-11-21: qty 250

## 2021-11-21 MED ORDER — VITAMIN B-12 1000 MCG PO TABS
1000.0000 ug | ORAL_TABLET | Freq: Every day | ORAL | Status: DC
  Administered 2021-11-22 – 2021-11-23 (×2): 1000 ug via ORAL
  Filled 2021-11-21 (×2): qty 1

## 2021-11-21 NOTE — H&P (Addendum)
?Cardiology Admission History and Physical:  ? ?Patient ID: Kirk Baltimore Wold Sr. ?MRN: [id number]; DOB: 1941/07/13  ? ?Admission date: 11/21/2021 ? ?PCP:  Anson [place], MD ?  ?[facility] Providers ?Cardiologist:  None    ? ? ?Chief Complaint:  Chest Pain ? ?Patient Profile:  ? ?Kirk Herford Sr. is a 81 y.o. male with past medical history of type 2 DM, tobacco use, hypertension, AAA, OSA who is being seen 11/21/2021 for the evaluation of chest pain with exertion.  ? ?History of Present Illness:  ? ?Kirk Roberts is a 81 year old male with above medical history who is followed by the [place]. Patient is also scheduled to go to the [place] for cardiac catheterization at the end of May.  ? ?Patient presented to the ED on 5/9 complaining of chest pain. Vital signs showed BP 119/97, HR 93, oxygen saturation 96% on room air. Labs in the ED showed Na 139, K 3.7, creatinine 1.33, WBC 5.7, hemoglobin 41.2, platelets 114. Initial hsTn 572.  ?EKG showed sinus rhythm, inverted T waves in leads I, aVL, V4-V6.  ?CXR showed left basilar airspace disease that may reflect atelectasis vs pneumonia.  ? ?On interview, patient reports that he has no known cardiac history. Has never had a heart attack or been told that he has heart failure. Patient started developing SOB on exertion in February 2023, underwent a nuclear stress test with CT attenuation through the [place] in March 2023. Study did not show ischemia, but CT did note coronary artery vascular calcification. 2 weeks ago, patient began to have chest pain on exertion in addition to his SOB. Chest pain was originally only on exertion and relieved with rest. Patient contacted his cardiologist at the [facility], and was scheduled for a cardiac catheterization on 5/27. However, his symptoms progressed and patient began to experience pain while at rest. Overnight, patient had a few episodes of waking up to urinate, and noticed chest pain. Woke up this AM, walked to his kitchen, and noticed severe chest pain  that did not resolve with rest. Pain is located on the left side of his chest, left shoulder, and down left arm to the elbow. Does not radiate to neck or back. Of note, patient has some lower back pain that is related to a recent nerve ablation in his lumbar spine. Denies any thoracic or cervical spine/back pain. Reports associated SOB. Denies nausea, vomiting, cough, fever, body aches, dizziness. Is currently treated for HLD, HTN, diabetes. Quit smoking 1.5 years ago.  ? ? ?Past Medical History:  ?Diagnosis Date  ? Arthritis   ? oa  ? Back pain   ? at times  ? Diabetes mellitus without complication ([place])   ? diet controlled  ? Hard of hearing   ? Heart murmur   ? Hypertension   ? Measles 1950's  ? Mumps 1950's  ? Nocturia   ? Shortness of breath   ? on exertion  ? Sleep apnea   ? no CPAP  ? ? ?Past Surgical History:  ?Procedure Laterality Date  ? colonscopy    ? left partial knee replacement  2001  ? TOTAL KNEE ARTHROPLASTY Right 05/12/2014  ? Procedure: RIGHT TOTAL KNEE ARTHROPLASTY;  Surgeon: Gearlean Alf, MD;  Location: [facility];  Service: Orthopedics;  Laterality: Right;  ?  ? ?Medications Prior to Admission: ?Prior to Admission medications   ?Medication Sig Start Date End Date Taking? Authorizing Provider  ?acetaminophen (TYLENOL) 325 MG tablet Take 2 tablets (650 mg total)  by mouth every 6 (six) hours as needed for mild pain (or Fever >/= 101). ?Patient not taking: Reported on 10/03/2021 05/14/14   Dara Lords, Alexzandrew L, PA-C  ?aspirin EC 81 MG tablet Take 81 mg by mouth daily. Swallow whole. ?Patient not taking: Reported on 10/03/2021    [provider]  ?atorvastatin (LIPITOR) 20 MG tablet TAKE ONE TABLET BY MOUTH AT BEDTIME FOR CHOLESTEROL REPEAT FASTING LABS IN MID MAY . 09/30/20   [provider]  ?Carboxymethylcellul-Glycerin (CARBOXYMETHYLCELL-GLYCERIN PF OP) Apply to eye.    [provider]  ?Cholecalciferol 50 MCG (2000 UT) TABS Take 1 tablet by mouth daily. 09/30/20    [provider]  ?diclofenac (VOLTAREN) 75 MG EC tablet Take 1 tablet by mouth 2 (two) times daily. ?Patient not taking: Reported on 10/03/2021 09/30/20   [provider]  ?diclofenac Sodium (VOLTAREN) 1 % GEL APPLY 2 GRAMS TO AFFECTED AREA THREE TIMES A DAY 03/30/20   [provider]  ?gabapentin (NEURONTIN) 300 MG capsule Take 300 mg by mouth 2 (two) times daily. ?Patient not taking: Reported on 10/03/2021    [provider]  ?glimepiride (AMARYL) 2 MG tablet TAKE ONE TABLET BY MOUTH EVERY MORNING FOR DIABETES 09/30/20   [provider]  ?lidocaine (LIDODERM) 5 % APPLY 1 PATCH TO SKIN AS DIRECTED BY YOUR MEDICAL PROVIDER AS NEEDED (APPLY FOR 12 HOURS, THEN REMOVE FOR 12 HOURS) PLACE 12 HOURS ON AND 12 HOURS OFF 03/30/20   [provider]  ?lisinopril-hydrochlorothiazide (ZESTORETIC) 20-12.5 MG tablet Take 1 tablet by mouth every morning.    [provider]  ?lovastatin (MEVACOR) 10 MG tablet Take 10 mg by mouth at bedtime.  ?Patient not taking: Reported on 10/03/2021    [provider]  ?metFORMIN (GLUCOPHAGE-XR) 500 MG 24 hr tablet TAKE TWO TABLETS BY MOUTH BEFORE BREAKFAST AND TAKE ONE TABLET AT BEDTIME FOR DIABETES (ANNUAL KIDNEY FUNCTION TESTING IS NEEDED) 03/30/20   [provider]  ?methocarbamol (ROBAXIN) 750 MG tablet TAKE ONE TABLET BY MOUTH THREE TIMES A DAY AS NEEDED *MAY CAUSE SLEEPINESS; DO NOT DRIVE AFTER TAKING IT 05/27/20   [provider]  ?Propylene Glycol (SYSTANE BALANCE OP) Apply to eye.    [provider]  ?Tetrahydrozoline HCl (VISINE EXTRA OP) Place 1 drop into both eyes daily as needed (dry eyes/allergies.).    [provider]  ?traZODone (DESYREL) 100 MG tablet Take 1 tablet by mouth at bedtime. 12/22/20   [provider]  ?vitamin B-12 (CYANOCOBALAMIN) 500 MCG tablet Take 2 tablets by mouth daily. 01/19/21   [provider]  ?  ? ?Allergies:   No Known Allergies ? ?Social  History:   ?Social History  ? ?Socioeconomic History  ? Marital status: Widowed  ?  Spouse name: Not on file  ? Number of children: Not on file  ? Years of education: Not on file  ? Highest education level: Not on file  ?Occupational History  ? Not on file  ?Tobacco Use  ? Smoking status: Some Days  ?  Packs/day: 0.50  ?  Years: 40.00  ?  Pack years: 20.00  ?  Types: Cigarettes  ? Smokeless tobacco: Never  ?Substance and Sexual Activity  ? Alcohol use: No  ? Drug use: No  ? Sexual activity: Not on file  ?Other Topics Concern  ? Not on file  ?Social History Narrative  ? Not on file  ? ?Social Determinants of Health  ? ?Financial Resource Strain: Not on file  ?  Food Insecurity: Not on file  ?Transportation Needs: Not on file  ?Physical Activity: Not on file  ?Stress: Not on file  ?Social Connections: Not on file  ?Intimate Partner Violence: Not on file  ?  ?Family History:   ?The patient's family history is not on file.   ? ?ROS:  ?Please see the history of present illness.  ?All other ROS reviewed and negative.    ? ?Physical Exam/Data:  ? ?Vitals:  ? 11/21/21 1245 11/21/21 1300 11/21/21 1315 11/21/21 1335  ?BP: 128/72 130/87 (!) 159/84 (!) 139/103  ?Pulse: 83 82 (!) 101 70  ?Resp: 18 17 20 17   ?Temp:      ?TempSrc:      ?SpO2: 98% 96% 97% 97%  ? ?No intake or output data in the 24 hours ending 11/21/21 1501 ? ?  10/03/2021  ?  1:47 PM 02/21/2021  ?  1:24 PM 05/12/2014  ?  4:25 PM  ?Last 3 Weights  ?Weight (lbs) 269 lb 274 lb 6.4 oz 278 lb  ?Weight (kg) 122.018 kg 124.467 kg 126.1 kg  ?   ?There is no height or weight on file to calculate BMI.  ?General:  Well nourished, elderly gentleman in no acute distress.  ?HEENT: normal ?Neck: no JVD ?Vascular: Radial pulses 2+ bilaterally ?Cardiac:  normal S1, S2; RRR; grade 2/6 systolic murmur at RUSB  ?Lungs:  clear to auscultation bilaterally, no wheezing, rhonchi or rales  ?Abd: soft, nontender, no hepatomegaly. Mildly distended  ?Ext: no edema ?Musculoskeletal:  No  deformities, BUE and BLE strength normal and equal ?Skin: warm and dry  ?Neuro:  CNs 2-12 intact, no focal abnormalities noted ?Psych:  Normal affect  ? ? ?EKG:  The ECG that was done 5/9 was personally reviewed and Burt Knack

## 2021-11-21 NOTE — ED Triage Notes (Addendum)
EMS stated, chest pain with exertion for a week and half. Goes to [place] for cardiac cath at the end of month. ?ASA 324 ?20g rt. AC  ?Nitro not given due to pain stopped. ?

## 2021-11-21 NOTE — ED Notes (Signed)
This RN noticed pt's BP to be running low, SBP in 80s. Pt stated he was asymptomatic to low BP and having no complaints of CP. This RN lowered nitro to 63mL/hr. SBP now in 90s. ?

## 2021-11-21 NOTE — Progress Notes (Signed)
ANTICOAGULATION CONSULT NOTE - Initial Consult ? ?Pharmacy Consult for Heparin ?Indication: chest pain/ACS ? ?No Known Allergies ? ?Patient Measurements: ?Height: 6'1" (185.4 cm) ?Weight: 122.3 kg (269 lb) ?IBW/kg (Calculated): 79.9 kg ?Heparin Dosing Weight: 106.6 kg  ? ?Vital Signs: ?Temp: 98.4 ?F (36.9 ?C) (05/09 1120) ?Temp Source: Oral (05/09 1120) ?BP: 139/103 (05/09 1335) ?Pulse Rate: 70 (05/09 1335) ? ?Labs: ?Recent Labs  ?  11/21/21 ?1130  ?HGB 14.0  ?HCT 41.2  ?PLT 114*  ?CREATININE 1.33*  ?TROPONINIHS 572*  ? ? ?CrCl cannot be calculated (Unknown ideal weight.). ? ? ?Medical History: ?Past Medical History:  ?Diagnosis Date  ? Arthritis   ? oa  ? Back pain   ? at times  ? Diabetes mellitus without complication ([facility])   ? diet controlled  ? Hard of hearing   ? Heart murmur   ? Hypertension   ? Measles 1950's  ? Mumps 1950's  ? Nocturia   ? Shortness of breath   ? on exertion  ? Sleep apnea   ? no CPAP  ? ? ?Medications:  ?Scheduled:  ? [START ON 11/22/2021] aspirin EC  81 mg Oral Daily  ? atorvastatin  80 mg Oral Daily  ? carvedilol  12.5 mg Oral BID WC  ? [START ON 11/22/2021] glimepiride  2 mg Oral Q breakfast  ? insulin aspart  0-15 Units Subcutaneous TID WC  ? traZODone  100 mg Oral QHS  ? vitamin B-12  1,000 mcg Oral Daily  ? ?Infusions:  ? nitroGLYCERIN    ? ? ?Assessment: ?81 yo M presenting with ~2 week history of CP, not on anticoagulation PTA. Pharmacy consulted for heparin dosing. Baseline CBC - ok. No signs/symptoms of bleeding noted.  ? ?Goal of Therapy:  ?Heparin level 0.3-0.7 units/ml ?Monitor platelets by anticoagulation protocol: Yes ?  ?Plan:  ?Give heparin bolus 4000 units x 1, followed by infusion at 1300 units/hr. ?Check ~8 hr heparin level.  ?Daily CBC, heparin level. ?Monitor for signs/symptoms of bleeding. ? ? ?Jerrilyn Cairo, PharmD ?PGY1 Pharmacy Resident ?11/21/2021 3:02 PM  ? ?Please check AMION for all [facility] phone numbers ?After 10:00 PM, call Main Pharmacy [phone] ? ? ? ?

## 2021-11-21 NOTE — ED Notes (Signed)
Pt ambulatory from 21 orange to 44 yellow, alert, NAD, calm, interactive, steady gait, denies CP or other sx at this time. EKG repeated upon arrival.  ?

## 2021-11-21 NOTE — ED Provider Triage Note (Signed)
Emergency Medicine Provider Triage Evaluation Note ? ?Gid Schoffstall , a 81 y.o. adult  was evaluated in triage.  Pt complains of left-sided chest pain, arm pain, back pain.  Patient states he has had this intermittently for weeks.  It has been getting worse with exertion.  The patient recently had a cardiac stress test and is scheduled for cardiac catheterization at the [place] at the end of this month.  Patient states he cannot wait that long.  Endorses mild shortness of breath.  Denies abdominal pain, nausea, vomiting ? ?Review of Systems  ?Positive: Chest pain, left arm pain, left-sided back pain, shortness of breath ?Negative: Abdominal pain, nausea, vomiting ? ?Physical Exam  ?BP (!) 119/97 (BP Location: Left Arm)   Pulse 92   Temp 98.4 ?F (36.9 ?C) (Oral)   Resp 18   SpO2 96%  ?Gen:   Awake, no distress   ?Resp:  Normal effort  ?MSK:   Moves extremities without difficulty  ?Other:   ? ?Medical Decision Making  ?Medically screening exam initiated at 11:42 AM.  Appropriate orders placed.  Mong Neal was informed that the remainder of the evaluation will be completed by another provider, this initial triage assessment does not replace that evaluation, and the importance of remaining in the ED until their evaluation is complete. ? ? ?  ?Darrick Grinder, PA-C ?11/21/21 1148 ? ?

## 2021-11-21 NOTE — ED Notes (Addendum)
Nitro paused d/t BP not improving. This RN paged MD Patsi Sears. ?

## 2021-11-21 NOTE — ED Triage Notes (Signed)
Pt. Stated, I dont have chest pain its shoulder pain and back pain ?

## 2021-11-21 NOTE — ED Provider Notes (Signed)
?[place] ?Provider Note ? ? ?CSN: [id number] ?Arrival date & time: 11/21/21  1116 ? ?  ? ?History ? ?Chief Complaint  ?Patient presents with  ? Chest Pain  ? Hypertension  ? ? ?Kirk Theys Sr. is a 81 y.o. male. ? ? Patient as above with significant medical history as below, including HTN, DM, AAA, OSA, OA who presents to the ED with complaint of chest pain.  Pain has been ongoing for 1.5 weeks.  Pressure, heaviness sensation to left shoulder, left-sided chest.  Radiation down his left arm.  Worsened with exertion.  Does improve with rest but does remain symptomatic even with rest last few days. Associated with mild dyspnea.  He has been taking his home medications as prescribed.  No recent stimulant or cocaine use.  No fevers, chills, nausea or vomiting.  No diaphoresis or lightheadedness, no syncopal episodes.  Currently having some left-sided shoulder pressure and heaviness. Of note patient does point to his left chest wall when he is describing the location of his pain. Given aspirin PTA.  Patient reports [facility]   ? ? ? ?Past Medical History: ?No date: Arthritis ?    Comment:  oa ?No date: Back pain ?    Comment:  at times ?No date: Diabetes mellitus without complication ([place]) ?    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    Comment:  on exertion ?No date: Sleep apnea ?    Comment:  no CPAP ? ?Past Surgical History: ?No date: colonscopy ?2001: left partial knee replacement ?05/12/2014: TOTAL KNEE ARTHROPLASTY; Right ?    Comment:  Procedure: RIGHT TOTAL KNEE ARTHROPLASTY;  Surgeon:  ?             Gearlean Alf, MD;  Location: [facility];  Service:  ?             Orthopedics;  Laterality: Right;  ? ? ?The history is provided by the patient. No language interpreter was used.  ?Chest Pain ?Associated  symptoms: fatigue and shortness of breath   ?Associated symptoms: no abdominal pain, no back pain, no cough, no dysphagia, no fever, no headache, no nausea and no palpitations   ?Hypertension ?Associated symptoms include chest pain and shortness of breath. Pertinent negatives include no abdominal pain and no headaches.  ? ?  ? ?Home Medications ?Prior to Admission medications   ?Medication Sig Start Date End Date Taking? Authorizing Provider  ?acetaminophen (TYLENOL) 325 MG tablet Take 2 tablets (650 mg total) by mouth every 6 (six) hours as needed for mild pain (or Fever >/= 101). ?Patient not taking: Reported on 10/03/2021 05/14/14   Dara Lords, Alexzandrew L, PA-C  ?aspirin EC 81 MG tablet Take 81 mg by mouth daily. Swallow whole. ?Patient not taking: Reported on 10/03/2021    [provider]  ?atorvastatin (LIPITOR) 20 MG tablet TAKE ONE TABLET BY MOUTH AT BEDTIME FOR CHOLESTEROL REPEAT FASTING LABS IN MID MAY . 09/30/20   [provider]  ?Carboxymethylcellul-Glycerin (CARBOXYMETHYLCELL-GLYCERIN PF OP) Apply to eye.    [provider]  ?Cholecalciferol 50 MCG (2000 UT) TABS Take 1 tablet by mouth daily. 09/30/20   [provider]  ?diclofenac (VOLTAREN) 75 MG EC tablet Take 1 tablet by mouth 2 (two) times daily. ?Patient not taking: Reported on 10/03/2021 09/30/20  [provider]  ?diclofenac Sodium (VOLTAREN) 1 % GEL APPLY 2 GRAMS TO AFFECTED AREA THREE TIMES A DAY 03/30/20   [provider]  ?gabapentin (NEURONTIN) 300 MG capsule Take 300 mg by mouth 2 (two) times daily. ?Patient not taking: Reported on 10/03/2021    [provider]  ?glimepiride (AMARYL) 2 MG tablet TAKE ONE TABLET BY MOUTH EVERY MORNING FOR DIABETES 09/30/20   [provider]  ?lidocaine (LIDODERM) 5 % APPLY 1 PATCH TO SKIN AS DIRECTED BY YOUR MEDICAL PROVIDER AS NEEDED (APPLY FOR 12 HOURS, THEN REMOVE FOR 12 HOURS) PLACE 12 HOURS ON AND 12 HOURS OFF 03/30/20   [provider]  ?lisinopril-hydrochlorothiazide (ZESTORETIC) 20-12.5 MG tablet Take 1 tablet by mouth every morning.    [provider]  ?lovastatin (MEVACOR) 10 MG tablet Take 10 mg by mouth at bedtime.  ?Patient not taking: Reported on 10/03/2021    [provider]  ?metFORMIN (GLUCOPHAGE-XR) 500 MG 24 hr tablet TAKE TWO TABLETS BY MOUTH BEFORE BREAKFAST AND TAKE ONE TABLET AT BEDTIME FOR DIABETES (ANNUAL KIDNEY FUNCTION TESTING IS NEEDED) 03/30/20   [provider]  ?methocarbamol (ROBAXIN) 750 MG tablet TAKE ONE TABLET BY MOUTH THREE TIMES A DAY AS NEEDED *MAY CAUSE SLEEPINESS; DO NOT DRIVE AFTER TAKING IT 05/27/20   [provider]  ?Propylene Glycol (SYSTANE BALANCE OP) Apply to eye.    [provider]  ?Tetrahydrozoline HCl (VISINE EXTRA OP) Place 1 drop into both eyes daily as needed (dry eyes/allergies.).    [provider]  ?traZODone (DESYREL) 100 MG tablet Take 1 tablet by mouth at bedtime. 12/22/20   [provider]  ?vitamin B-12 (CYANOCOBALAMIN) 500 MCG tablet Take 2 tablets by mouth daily. 01/19/21   [provider]  ?   ? ?Allergies    ?Patient has no known allergies.   ? ?Review of Systems   ?Review of Systems  ?Constitutional:  Positive for fatigue. Negative for activity change and fever.  ?HENT:  Negative for facial swelling and trouble swallowing.   ?Eyes:  Negative for discharge and redness.  ?Respiratory:  Positive for shortness of breath. Negative for cough.   ?Cardiovascular:  Positive for chest pain. Negative for palpitations.  ?Gastrointestinal:  Negative for abdominal pain and nausea.  ?Genitourinary:  Negative for dysuria and flank pain.  ?Musculoskeletal:  Positive for arthralgias. Negative for back pain and gait problem.  ?Skin:  Negative for pallor and rash.  ?Neurological:  Negative for syncope and headaches.  ? ?Physical Exam ?Updated Vital Signs ?BP (!) 143/92   Pulse 70   Temp 98.4 ?F (36.9 ?C) (Oral)   Resp 17    SpO2 96%  ?Physical Exam ?Vitals and nursing note reviewed.  ?Constitutional:   ?   General: He is not in acute distress. ?   Appearance: Normal appearance. He is well-developed. He is obese. He is not ill-appearing or diaphoretic.  ?HENT:  ?   Head: Normocephalic and atraumatic.  ?   Right Ear: External ear normal.  ?   Left Ear: External ear normal.  ?   Nose: Nose normal.  ?   Mouth/Throat:  ?   Mouth: Mucous membranes are moist.  ?Eyes:  ?   General: No scleral icterus.    ?   Right eye: No discharge.     ?   Left eye: No discharge.  ?Cardiovascular:  ?   Rate and Rhythm: Normal rate and regular rhythm.  ?   Pulses: Normal  pulses.     ?     Radial pulses are 2+ on the right side and 2+ on the left side.  ?     Dorsalis pedis pulses are 2+ on the right side and 2+ on the left side.  ?   Heart sounds: Murmur heard.  ?Systolic murmur is present.  ?Pulmonary:  ?   Effort: Pulmonary effort is normal. No tachypnea or respiratory distress.  ?   Breath sounds: Normal breath sounds. No stridor. No decreased breath sounds.  ?Abdominal:  ?   General: Abdomen is flat.  ?   Palpations: Abdomen is soft.  ?   Tenderness: There is no abdominal tenderness. There is no guarding or rebound.  ?   Comments: No pulsatile mass  ?Musculoskeletal:     ?   General: Normal range of motion.  ?   Cervical back: Normal range of motion.  ?   Right lower leg: No edema.  ?   Left lower leg: No edema.  ?Skin: ?   General: Skin is warm and dry.  ?   Capillary Refill: Capillary refill takes less than 2 seconds.  ?Neurological:  ?   Mental Status: He is alert and oriented to person, place, and time.  ?   GCS: GCS eye subscore is 4. GCS verbal subscore is 5. GCS motor subscore is 6.  ?   Cranial Nerves: No dysarthria.  ?   Motor: No tremor.  ?Psychiatric:     ?   Mood and Affect: Mood normal.     ?   Behavior: Behavior normal.  ? ? ?ED Results / Procedures / Treatments   ?Labs ?(all labs ordered are listed, but only abnormal results are  displayed) ?Labs Reviewed  ?BASIC METABOLIC PANEL - Abnormal; Notable for the following components:  ?    Result Value  ? Glucose, Bld 173 (*)   ? Creatinine, Ser 1.33 (*)   ? GFR, Estimated 55 (*)   ? All other components w

## 2021-11-22 ENCOUNTER — Observation Stay (HOSPITAL_COMMUNITY): Payer: No Typology Code available for payment source

## 2021-11-22 ENCOUNTER — Encounter (HOSPITAL_COMMUNITY)
Admission: EM | Disposition: A | Discharge status: Home / Self Care | Payer: Self-pay | Source: Home / Self Care | Attending: Cardiovascular Disease

## 2021-11-22 DIAGNOSIS — I214 Non-ST elevation (NSTEMI) myocardial infarction: Secondary | ICD-10-CM

## 2021-11-22 DIAGNOSIS — I350 Nonrheumatic aortic (valve) stenosis: Secondary | ICD-10-CM | POA: Diagnosis present

## 2021-11-22 DIAGNOSIS — I2510 Atherosclerotic heart disease of native coronary artery without angina pectoris: Secondary | ICD-10-CM | POA: Diagnosis not present

## 2021-11-22 DIAGNOSIS — Z79899 Other long term (current) drug therapy: Secondary | ICD-10-CM | POA: Diagnosis not present

## 2021-11-22 DIAGNOSIS — N182 Chronic kidney disease, stage 2 (mild): Secondary | ICD-10-CM | POA: Diagnosis present

## 2021-11-22 DIAGNOSIS — R0789 Other chest pain: Secondary | ICD-10-CM | POA: Diagnosis present

## 2021-11-22 DIAGNOSIS — R911 Solitary pulmonary nodule: Secondary | ICD-10-CM | POA: Diagnosis present

## 2021-11-22 DIAGNOSIS — I7143 Infrarenal abdominal aortic aneurysm, without rupture: Secondary | ICD-10-CM | POA: Diagnosis present

## 2021-11-22 DIAGNOSIS — F17210 Nicotine dependence, cigarettes, uncomplicated: Secondary | ICD-10-CM | POA: Diagnosis present

## 2021-11-22 DIAGNOSIS — Z96651 Presence of right artificial knee joint: Secondary | ICD-10-CM | POA: Diagnosis present

## 2021-11-22 DIAGNOSIS — I129 Hypertensive chronic kidney disease with stage 1 through stage 4 chronic kidney disease, or unspecified chronic kidney disease: Secondary | ICD-10-CM | POA: Diagnosis present

## 2021-11-22 DIAGNOSIS — E1122 Type 2 diabetes mellitus with diabetic chronic kidney disease: Secondary | ICD-10-CM | POA: Diagnosis present

## 2021-11-22 DIAGNOSIS — I25110 Atherosclerotic heart disease of native coronary artery with unstable angina pectoris: Secondary | ICD-10-CM | POA: Diagnosis present

## 2021-11-22 DIAGNOSIS — E669 Obesity, unspecified: Secondary | ICD-10-CM | POA: Diagnosis present

## 2021-11-22 DIAGNOSIS — E785 Hyperlipidemia, unspecified: Secondary | ICD-10-CM | POA: Diagnosis present

## 2021-11-22 DIAGNOSIS — Z7984 Long term (current) use of oral hypoglycemic drugs: Secondary | ICD-10-CM | POA: Diagnosis not present

## 2021-11-22 HISTORY — PX: CORONARY STENT INTERVENTION: CATH118234

## 2021-11-22 HISTORY — PX: RIGHT/LEFT HEART CATH AND CORONARY ANGIOGRAPHY: CATH118266

## 2021-11-22 LAB — POCT I-STAT EG7
Acid-Base Excess: 0 mmol/L (ref 0.0–2.0)
Bicarbonate: 25.4 mmol/L (ref 20.0–28.0)
Calcium, Ion: 1.2 mmol/L (ref 1.15–1.40)
HCT: 34 % — ABNORMAL LOW (ref 39.0–52.0)
Hemoglobin: 11.6 g/dL — ABNORMAL LOW (ref 13.0–17.0)
O2 Saturation: 69 %
Potassium: 3.7 mmol/L (ref 3.5–5.1)
Sodium: 140 mmol/L (ref 135–145)
TCO2: 27 mmol/L (ref 22–32)
pCO2, Ven: 42.3 mmHg — ABNORMAL LOW (ref 44–60)
pH, Ven: 7.386 (ref 7.25–7.43)
pO2, Ven: 36 mmHg (ref 32–45)

## 2021-11-22 LAB — LIPID PANEL
Cholesterol: 118 mg/dL (ref 0–200)
HDL: 42 mg/dL (ref >40)
LDL Cholesterol: 41 mg/dL (ref 0–99)
Total CHOL/HDL Ratio: 2.8 RATIO
Triglycerides: 174 mg/dL — ABNORMAL HIGH (ref <150)
VLDL: 35 mg/dL (ref 0–40)

## 2021-11-22 LAB — HEPARIN LEVEL (UNFRACTIONATED)
Heparin Unfractionated: 0.31 IU/mL (ref 0.30–0.70)
Heparin Unfractionated: 0.53 IU/mL (ref 0.30–0.70)

## 2021-11-22 LAB — ECHOCARDIOGRAM COMPLETE
AR max vel: 0.92 cm2
AV Area VTI: 1.09 cm2
AV Area mean vel: 1.08 cm2
AV Mean grad: 40 mmHg
AV Peak grad: 75.7 mmHg
Ao pk vel: 4.35 m/s
Area-P 1/2: 2.71 cm2
S' Lateral: 3 cm

## 2021-11-22 LAB — POCT ACTIVATED CLOTTING TIME: Activated Clotting Time: 353 seconds

## 2021-11-22 LAB — POCT I-STAT 7, (LYTES, BLD GAS, ICA,H+H)
Acid-Base Excess: 0 mmol/L (ref 0.0–2.0)
Bicarbonate: 24.8 mmol/L (ref 20.0–28.0)
Calcium, Ion: 1.23 mmol/L (ref 1.15–1.40)
HCT: 34 % — ABNORMAL LOW (ref 39.0–52.0)
Hemoglobin: 11.6 g/dL — ABNORMAL LOW (ref 13.0–17.0)
O2 Saturation: 98 %
Potassium: 3.7 mmol/L (ref 3.5–5.1)
Sodium: 140 mmol/L (ref 135–145)
TCO2: 26 mmol/L (ref 22–32)
pCO2 arterial: 40.9 mmHg (ref 32–48)
pH, Arterial: 7.391 (ref 7.35–7.45)
pO2, Arterial: 100 mmHg (ref 83–108)

## 2021-11-22 LAB — GLUCOSE, CAPILLARY
Glucose-Capillary: 116 mg/dL — ABNORMAL HIGH (ref 70–99)
Glucose-Capillary: 115 mg/dL — ABNORMAL HIGH (ref 70–99)
Glucose-Capillary: 148 mg/dL — ABNORMAL HIGH (ref 70–99)

## 2021-11-22 LAB — HEMOGLOBIN A1C
Hgb A1c MFr Bld: 6.8 % — ABNORMAL HIGH (ref 4.8–5.6)
Hgb A1c MFr Bld: 6.7 % — ABNORMAL HIGH (ref 4.8–5.6)
Mean Plasma Glucose: 148.46 mg/dL
Mean Plasma Glucose: 145.59 mg/dL

## 2021-11-22 LAB — BASIC METABOLIC PANEL
Anion gap: 10 (ref 5–15)
BUN: 11 mg/dL (ref 8–23)
CO2: 26 mmol/L (ref 22–32)
Calcium: 9.3 mg/dL (ref 8.9–10.3)
Chloride: 105 mmol/L (ref 98–111)
Creatinine, Ser: 1.32 mg/dL — ABNORMAL HIGH (ref 0.61–1.24)
GFR, Estimated: 55 mL/min — ABNORMAL LOW (ref >60)
Glucose, Bld: 106 mg/dL — ABNORMAL HIGH (ref 70–99)
Potassium: 3.8 mmol/L (ref 3.5–5.1)
Sodium: 141 mmol/L (ref 135–145)

## 2021-11-22 LAB — CBC
HCT: 41.6 % (ref 39.0–52.0)
Hemoglobin: 13.9 g/dL (ref 13.0–17.0)
MCH: 33.1 pg (ref 26.0–34.0)
MCHC: 33.4 g/dL (ref 30.0–36.0)
MCV: 99 fL (ref 80.0–100.0)
Platelets: 117 10*3/uL — ABNORMAL LOW (ref 150–400)
RBC: 4.2 MIL/uL — ABNORMAL LOW (ref 4.22–5.81)
RDW: 14.4 % (ref 11.5–15.5)
WBC: 7.1 10*3/uL (ref 4.0–10.5)
nRBC: 0 % (ref 0.0–0.2)

## 2021-11-22 LAB — CBG MONITORING, ED: Glucose-Capillary: 116 mg/dL — ABNORMAL HIGH (ref 70–99)

## 2021-11-22 SURGERY — RIGHT/LEFT HEART CATH AND CORONARY ANGIOGRAPHY
Anesthesia: LOCAL

## 2021-11-22 MED ORDER — FENTANYL CITRATE (PF) 100 MCG/2ML IJ SOLN
INTRAMUSCULAR | Status: AC
  Filled 2021-11-22: qty 2

## 2021-11-22 MED ORDER — CLOPIDOGREL BISULFATE 300 MG PO TABS
ORAL_TABLET | ORAL | Status: AC
  Filled 2021-11-22: qty 2

## 2021-11-22 MED ORDER — HEPARIN (PORCINE) IN NACL 1000-0.9 UT/500ML-% IV SOLN
INTRAVENOUS | Status: DC | PRN
Start: 2021-11-22 — End: 2021-11-22
  Administered 2021-11-22 (×2): 500 mL

## 2021-11-22 MED ORDER — NITROGLYCERIN 1 MG/10 ML FOR IR/CATH LAB
INTRA_ARTERIAL | Status: DC | PRN
  Administered 2021-11-22: 200 ug via INTRACORONARY

## 2021-11-22 MED ORDER — SODIUM CHLORIDE 0.9 % IV SOLN: INTRAVENOUS | Status: AC

## 2021-11-22 MED ORDER — HEPARIN SODIUM (PORCINE) 1000 UNIT/ML IJ SOLN
INTRAMUSCULAR | Status: DC | PRN
  Administered 2021-11-22 (×2): 6000 [IU] via INTRAVENOUS

## 2021-11-22 MED ORDER — IOHEXOL 350 MG/ML SOLN
INTRAVENOUS | Status: DC | PRN
Start: 2021-11-22 — End: 2021-11-22
  Administered 2021-11-22: 130 mL via INTRA_ARTERIAL

## 2021-11-22 MED ORDER — MIDAZOLAM HCL 2 MG/2ML IJ SOLN
INTRAMUSCULAR | Status: DC | PRN
  Administered 2021-11-22: 1 mg via INTRAVENOUS

## 2021-11-22 MED ORDER — HEPARIN SODIUM (PORCINE) 1000 UNIT/ML IJ SOLN
INTRAMUSCULAR | Status: AC
  Filled 2021-11-22: qty 10

## 2021-11-22 MED ORDER — CLOPIDOGREL BISULFATE 300 MG PO TABS
ORAL_TABLET | ORAL | Status: DC | PRN
  Administered 2021-11-22: 600 mg via ORAL

## 2021-11-22 MED ORDER — VERAPAMIL HCL 2.5 MG/ML IV SOLN
INTRAVENOUS | Status: DC | PRN
  Administered 2021-11-22: 10 mL via INTRA_ARTERIAL

## 2021-11-22 MED ORDER — SODIUM CHLORIDE 0.9 % IV SOLN: 250.0000 mL | INTRAVENOUS | Status: DC | PRN

## 2021-11-22 MED ORDER — PERFLUTREN LIPID MICROSPHERE
1.0000 mL | INTRAVENOUS | Status: AC | PRN
  Administered 2021-11-22: 6 mL via INTRAVENOUS

## 2021-11-22 MED ORDER — SODIUM CHLORIDE 0.9 % WEIGHT BASED INFUSION: 1.0000 mL/kg/h | INTRAVENOUS | Status: DC

## 2021-11-22 MED ORDER — SODIUM CHLORIDE 0.9% FLUSH: 3.0000 mL | INTRAVENOUS | Status: DC | PRN

## 2021-11-22 MED ORDER — SODIUM CHLORIDE 0.9 % WEIGHT BASED INFUSION: 3.0000 mL/kg/h | INTRAVENOUS | Status: DC

## 2021-11-22 MED ORDER — FENTANYL CITRATE (PF) 100 MCG/2ML IJ SOLN
INTRAMUSCULAR | Status: DC | PRN
  Administered 2021-11-22: 50 ug via INTRAVENOUS

## 2021-11-22 MED ORDER — MIDAZOLAM HCL 2 MG/2ML IJ SOLN
INTRAMUSCULAR | Status: AC
  Filled 2021-11-22: qty 2

## 2021-11-22 MED ORDER — NITROGLYCERIN 1 MG/10 ML FOR IR/CATH LAB
INTRA_ARTERIAL | Status: AC
  Filled 2021-11-22: qty 10

## 2021-11-22 MED ORDER — LIDOCAINE HCL (PF) 1 % IJ SOLN
INTRAMUSCULAR | Status: DC | PRN
  Administered 2021-11-22 (×2): 3 mL

## 2021-11-22 MED ORDER — LIDOCAINE HCL (PF) 1 % IJ SOLN
INTRAMUSCULAR | Status: AC
  Filled 2021-11-22: qty 30

## 2021-11-22 MED ORDER — CLOPIDOGREL BISULFATE 75 MG PO TABS
75.0000 mg | ORAL_TABLET | Freq: Every day | ORAL | Status: DC
  Administered 2021-11-23: 75 mg via ORAL
  Filled 2021-11-22: qty 1

## 2021-11-22 MED ORDER — VERAPAMIL HCL 2.5 MG/ML IV SOLN
INTRAVENOUS | Status: AC
  Filled 2021-11-22: qty 2

## 2021-11-22 MED ORDER — HEPARIN (PORCINE) IN NACL 1000-0.9 UT/500ML-% IV SOLN
INTRAVENOUS | Status: AC
  Filled 2021-11-22: qty 1000

## 2021-11-22 MED ORDER — SODIUM CHLORIDE 0.9% FLUSH
3.0000 mL | Freq: Two times a day (BID) | INTRAVENOUS | Status: DC
  Administered 2021-11-22: 3 mL via INTRAVENOUS

## 2021-11-22 MED ORDER — SODIUM CHLORIDE 0.9% FLUSH: 3.0000 mL | Freq: Two times a day (BID) | INTRAVENOUS | Status: DC

## 2021-11-22 SURGICAL SUPPLY — 21 items
BALL SAPPHIRE NC24 3.5X15 (BALLOONS) ×2
BALLN SAPPHIRE 2.5X15 (BALLOONS) ×2
BALLOON SAPPHIRE 2.5X15 (BALLOONS) IMPLANT
BALLOON SAPPHIRE NC24 3.5X15 (BALLOONS) IMPLANT
CATH BALLN WEDGE 5F 110CM (CATHETERS) ×1 IMPLANT
CATH INFINITI 5FR JK (CATHETERS) ×1 IMPLANT
CATH INFINITI JR4 5F (CATHETERS) ×1 IMPLANT
CATH LAUNCHER 6FR EBU3.5 (CATHETERS) ×1 IMPLANT
DEVICE RAD COMP TR BAND LRG (VASCULAR PRODUCTS) ×1 IMPLANT
GLIDESHEATH SLEND SS 6F .021 (SHEATH) ×1 IMPLANT
GUIDEWIRE INQWIRE 1.5J.035X260 (WIRE) IMPLANT
INQWIRE 1.5J .035X260CM (WIRE) ×2
KIT ENCORE 26 ADVANTAGE (KITS) ×1 IMPLANT
KIT HEART LEFT (KITS) ×2 IMPLANT
PACK CARDIAC CATHETERIZATION (CUSTOM PROCEDURE TRAY) ×2 IMPLANT
SHEATH GLIDE SLENDER 4/5FR (SHEATH) ×1 IMPLANT
STENT SYNERGY XD 3.0X48 (Permanent Stent) IMPLANT
SYNERGY XD 3.0X48 (Permanent Stent) ×2 IMPLANT
TRANSDUCER W/STOPCOCK (MISCELLANEOUS) ×2 IMPLANT
TUBING CIL FLEX 10 FLL-RA (TUBING) ×2 IMPLANT
WIRE RUNTHROUGH .014X180CM (WIRE) ×1 IMPLANT

## 2021-11-22 NOTE — ED Notes (Addendum)
Pt woke up c/o CP and shoulder pain. SBP 120s. This RN restarted nitroglycerin at 42mcg/min. Will continue to monitor pt.  ?

## 2021-11-22 NOTE — Interval H&P Note (Signed)
History and Physical Interval Note: ? ?11/22/2021 ?2:36 PM ? ?Kirk Emmons Sr.  has presented today for surgery, with the diagnosis of cad.  The various methods of treatment have been discussed with the patient and family. After consideration of risks, benefits and other options for treatment, the patient has consented to  Procedure(s): ?RIGHT/LEFT HEART CATH AND CORONARY ANGIOGRAPHY (N/A) as a surgical intervention.  The patient's history has been reviewed, patient examined, no change in status, stable for surgery.  I have reviewed the patient's chart and labs.  Questions were answered to the patient's satisfaction.   ? ? ?Lorine Bears ? ? ?

## 2021-11-22 NOTE — H&P (View-Only) (Signed)
? ?Progress Note ? ?Patient Name: Kirk DroughtRobert Friley Sr. ?Date of Encounter: 11/22/2021 ? ?[facility] Cardiologist: [facility] ([facility] ? ?Subjective  ? ?Mild Left Shoulder pain this am ? ?Inpatient Medications  ?  ?Scheduled Meds: ? aspirin EC  81 mg Oral Daily  ? atorvastatin  80 mg Oral Daily  ? carvedilol  12.5 mg Oral BID WC  ? glimepiride  2 mg Oral Q breakfast  ? insulin aspart  0-15 Units Subcutaneous TID WC  ? sodium chloride flush  3 mL Intravenous Q12H  ? traZODone  100 mg Oral QHS  ? vitamin B-12  1,000 mcg Oral Daily  ? ?Continuous Infusions: ? sodium chloride    ? [START ON 11/23/2021] sodium chloride    ? Followed by  ? [START ON 11/23/2021] sodium chloride    ? heparin 1,450 Units/hr (11/22/21 0721)  ? nitroGLYCERIN 5 mcg/min (11/22/21 0841)  ? ?PRN Meds: ?sodium chloride, acetaminophen, ondansetron (ZOFRAN) IV, perflutren lipid microspheres (DEFINITY) IV suspension, sodium chloride flush  ? ?Vital Signs  ?  ?Vitals:  ? 11/22/21 0915 11/22/21 0945 11/22/21 1000 11/22/21 1009  ?BP: 121/83 132/76 134/77   ?Pulse: 70 (!) 57 (!) 56 69  ?Resp: 11 19 18    ?Temp:      ?TempSrc:      ?SpO2: 93% 94% 93%   ? ? ?Intake/Output Summary (Last 24 hours) at 11/22/2021 1126 ?Last data filed at 11/22/2021 0459 ?Gross per 24 hour  ?Intake 1154.53 ml  ?Output 825 ml  ?Net 329.53 ml  ? ? ?  10/03/2021  ?  1:47 PM 02/21/2021  ?  1:24 PM 05/12/2014  ?  4:25 PM  ?Last 3 Weights  ?Weight (lbs) 269 lb 274 lb 6.4 oz 278 lb  ?Weight (kg) 122.018 kg 124.467 kg 126.1 kg  ?   ? ?Telemetry  ?  ?Sinus - Personally Reviewed ? ?ECG  ?  ?Sinus, anterolateral TWI - Personally Reviewed ? ?Physical Exam  ? ?GEN: No acute distress.   ?Neck: No JVD ?Cardiac: RRR, no murmurs, rubs, or gallops.  ?Respiratory: Clear to auscultation bilaterally. ?GI: Soft, nontender, non-distended  ?MS: No edema; No deformity. ?Neuro:  Nonfocal  ?Psych: Normal affect  ? ?Labs  ?  ?High Sensitivity Troponin:   ?Recent Labs  ?Lab 11/21/21 ?1130 11/21/21 ?1457  ?TROPONINIHS 572*  914*  ?   ?Chemistry ?Recent Labs  ?Lab 11/21/21 ?1130 11/22/21 ?0412  ?NA 139 141  ?K 3.7 3.8  ?CL 106 105  ?CO2 22 26  ?GLUCOSE 173* 106*  ?BUN 12 11  ?CREATININE 1.33* 1.32*  ?CALCIUM 9.5 9.3  ?GFRNONAA 55* 55*  ?ANIONGAP 11 10  ?  ?Lipids  ?Recent Labs  ?Lab 11/22/21 ?0412  ?CHOL 118  ?TRIG 174*  ?HDL 42  ?LDLCALC 41  ?CHOLHDL 2.8  ?  ?Hematology ?Recent Labs  ?Lab 11/21/21 ?1130 11/22/21 ?0412  ?WBC 5.7 7.1  ?RBC 4.27 4.20*  ?HGB 14.0 13.9  ?HCT 41.2 41.6  ?MCV 96.5 99.0  ?MCH 32.8 33.1  ?MCHC 34.0 33.4  ?RDW 14.0 14.4  ?PLT 114* 117*  ? ?Thyroid No results for input(s): TSH, FREET4 in the last 168 hours.  ?BNPNo results for input(s): BNP, PROBNP in the last 168 hours.  ?DDimer No results for input(s): DDIMER in the last 168 hours.  ? ?Radiology  ?  ?DG Chest 1 View ? ?Result Date: 11/21/2021 ?CLINICAL DATA:  Left-sided chest pain.  Back pain. EXAM: CHEST  1 VIEW COMPARISON:  None Available. FINDINGS: Left basilar  airspace disease which may reflect atelectasis versus pneumonia. No pleural effusion or pneumothorax. Heart and mediastinal contours are unremarkable. No acute osseous abnormality. IMPRESSION: 1. Left basilar airspace disease which may reflect atelectasis versus pneumonia. Electronically Signed   By: Elige Ko M.D.   On: 11/21/2021 12:11  ? ?CT Angio Chest/Abd/Pel for Dissection W and/or Wo Contrast ? ?Result Date: 11/21/2021 ?CLINICAL DATA:  Chest pain or back pain, aortic dissection suspected EXAM: CT ANGIOGRAPHY CHEST, ABDOMEN AND PELVIS TECHNIQUE: Non-contrast CT of the chest was initially obtained. Multidetector CT imaging through the chest, abdomen and pelvis was performed using the standard protocol during bolus administration of intravenous contrast. Multiplanar reconstructed images and MIPs were obtained and reviewed to evaluate the vascular anatomy. RADIATION DOSE REDUCTION: This exam was performed according to the departmental dose-optimization program which includes automated exposure  control, adjustment of the mA and/or kV according to patient size and/or use of iterative reconstruction technique. CONTRAST:  OMNIPAQUE IOHEXOL 350 MG/ML SOLN COMPARISON:  None Available. FINDINGS: CTA CHEST FINDINGS Cardiovascular: Preferential opacification of the thoracic aorta. No evidence of thoracic aortic aneurysm or dissection. There is moderate mixed atherosclerosis of the thoracic aorta. Normal heart size. No pericardial effusion. Coronary artery atherosclerosis. Mediastinum/Nodes: No lymphadenopathy.  Small hiatal hernia. Lungs/Pleura: There are multifocal ground-glass opacities bilaterally in the upper and lower lobes (right: Series 9, images 59, 99-103, left: Series 9, images 54 and 113). There is a solid 5 mm right medial basilar pulmonary nodule (series 9, image 115). No pleural effusion. No pneumothorax. Musculoskeletal: No acute osseous abnormality. No suspicious lytic or blastic lesions. Multilevel degenerative changes of the spine. Review of the MIP images confirms the above findings. CTA ABDOMEN AND PELVIS FINDINGS VASCULAR Aorta: Mixed atherosclerotic plaque throughout the abdominal aorta. There is an infrarenal abdominal aortic aneurysm measuring 4.3 x 4.2 cm (series 7, image 214), with prominent intramural thrombus resulting in focal 60% stenosis (series 7, image 211). Celiac: Patent without significant stenosis. SMA: Patent with mild stenosis proximally. Renals: Patent without significant stenosis. IMA: Patent. Inflow: There is mixed atherosclerosis of the iliac vasculature without significant stenosis, aneurysm, or dissection. 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 Veins: No obvious venous abnormality within the limitations of this arterial phase study. Review of the MIP images confirms the above findings. NON-VASCULAR Hepatobiliary: No focal liver abnormality is seen. The gallbladder is unremarkable. Pancreas: Unremarkable. No pancreatic ductal dilatation or surrounding  inflammatory changes. Spleen: Normal in size without focal abnormality. 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 No hydronephrosis or nephrolithiasis. Simple right inferior pole renal cyst. The bladder is mildly distended. There is a right posterolateral bladder diverticulum. Stomach/Bowel: Small hiatal hernia. The stomach is otherwise within normal limits. There is no evidence of bowel obstruction.The appendix is normal. There is extensive colonic diverticulosis. No evidence of acute diverticulitis. Lymphatic: No lymphadenopathy Reproductive: Unremarkable. Other: There is a small fat containing left inguinal hernia. No ascites. No free air. Musculoskeletal: No acute osseous abnormality. No suspicious lytic or blastic lesions. Multilevel degenerative changes of the spine. 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 Review of the MIP images confirms the above findings. IMPRESSION: CHEST: No acute thoracic aortic pathology. 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 Multifocal patchy ground-glass opacities bilaterally, most consistent with an infectious/inflammatory process. Recommend follow-up chest CT in 3 months. Solid 5 mm right medial basilar pulmonary nodule for which a 12 month follow-up CT can be considered, can also be  re-evaluated on the above recommended follow-up chest CT. ABDOMEN/PELVIS: Infrarenal abdominal aortic aneurysm measuring up to 4.3 cm, with prominent intramural thrombus resulting in 60% focal stenosis. No abdominal aortic dissection. Recommend follow-up every 12 months and vascular consultation. This recommendation follows ACR consensus guidelines: White Paper of the ACR Incidental Findings Committee II on Vascular Findings. J Am Coll Radiol 2013; 10:789-794. No other acute findings in the abdomen or pelvis. Extensive colonic diverticulosis.  Normal appendix.  Electronically Signed   By: Caprice Renshaw M.D.   On: 11/21/2021 14:20   ? ?Cardiac Studies  ? ? ? ?Patient Profile  ?   ?81 y.o. male obese male with h/o HTN, AAA, tobacco abuse, DM and sleep apnea presenting to the ED with c/o lef

## 2021-11-22 NOTE — Plan of Care (Signed)
  Problem: Education: Goal: Understanding of CV disease, CV risk reduction, and recovery process will improve Outcome: Progressing   Problem: Activity: Goal: Ability to return to baseline activity level will improve Outcome: Progressing   Problem: Cardiovascular: Goal: Ability to achieve and maintain adequate cardiovascular perfusion will improve Outcome: Progressing   

## 2021-11-22 NOTE — ED Notes (Signed)
Breakfast orders placed 

## 2021-11-22 NOTE — Progress Notes (Signed)
? ?Progress Note ? ?Patient Name: Kirk Dokken Sr. ?Date of Encounter: 11/22/2021 ? ?[facility] Cardi[facility] ([facility] ? ?Subjective  ? ?Mild Left Shoulder pain this am ? ?Inpatient Medications  ?  ?Scheduled Meds: ? aspirin EC  81 mg Oral Daily  ? atorvastatin  80 mg Oral Daily  ? carvedilol  12.5 mg Oral BID WC  ? glimepiride  2 mg Oral Q breakfast  ? insulin aspart  0-15 Units Subcutaneous TID WC  ? sodium chloride flush  3 mL Intravenous Q12H  ? traZODone  100 mg Oral QHS  ? vitamin B-12  1,000 mcg Oral Daily  ? ?Continuous Infusions: ? sodium chloride    ? [START ON 11/23/2021] sodium chloride    ? Followed by  ? [START ON 11/23/2021] sodium chloride    ? heparin 1,450 Units/hr (11/22/21 0721)  ? nitroGLYCERIN 5 mcg/min (11/22/21 0841)  ? ?PRN Meds: ?sodium chloride, acetaminophen, ondansetron (ZOFRAN) IV, perflutren lipid microspheres (DEFINITY) IV suspension, sodium chloride flush  ? ?Vital Signs  ?  ?Vitals:  ? 11/22/21 0915 11/22/21 0945 11/22/21 1000 11/22/21 1009  ?BP: 121/83 132/76 134/77   ?Pulse: 70 (!) 57 (!) 56 69  ?Resp: 11 19 18   ?Temp:      ?TempSrc:      ?SpO2: 93% 94% 93%   ? ? ?Intake/Output Summary (Last 24 hours) at 11/22/2021 1126 ?Last data filed at 11/22/2021 0459 ?Gross per 24 hour  ?Intake 1154.53 ml  ?Output 825 ml  ?Net 329.53 ml  ? ? ?  10/03/2021  ?  1:47 PM 02/21/2021  ?  1:24 PM 05/12/2014  ?  4:25 PM  ?Last 3 Weights  ?Weight (lbs) 269 lb 274 lb 6.4 oz 278 lb  ?Weight (kg) 122.018 kg 124.467 kg 126.1 kg  ?   ? ?Telemetry  ?  ?Sinus - Personally Reviewed ? ?ECG  ?  ?Sinus, anterolateral TWI - Personally Reviewed ? ?Physical Exam  ? ?GEN: No acute distress.   ?Neck: No JVD ?Cardiac: RRR, no murmurs, rubs, or gallops.  ?Respiratory: Clear to auscultation bilaterally. ?GI: Soft, nontender, non-distended  ?MS: No edema; No deformity. ?Neuro:  Nonfocal  ?Psych: Normal affect  ? ?Labs  ?  ?High Sensitivity Troponin:   ?Recent Labs  ?Lab 11/21/21 ?1130 11/21/21 ?1457  ?TROPONINIHS 572*  914*  ?   ?Chemistry ?Recent Labs  ?Lab 11/21/21 ?1130 11/22/21 ?0412  ?NA 139 141  ?K 3.7 3.8  ?CL 106 105  ?CO2 22 26  ?GLUCOSE 173* 106*  ?BUN 12 11  ?CREATININE 1.33* 1.32*  ?CALCIUM 9.5 9.3  ?GFRNONAA 55* 55*  ?ANIONGAP 11 10  ?  ?Lipids  ?Recent Labs  ?Lab 11/22/21 ?0412  ?CHOL 118  ?TRIG 174*  ?HDL 42  ?LDLCALC 41  ?CHOLHDL 2.8  ?  ?Hematology ?Recent Labs  ?Lab 11/21/21 ?1130 11/22/21 ?0412  ?WBC 5.7 7.1  ?RBC 4.27 4.20*  ?HGB 14.0 13.9  ?HCT 41.2 41.6  ?MCV 96.5 99.0  ?MCH 32.8 33.1  ?MCHC 34.0 33.4  ?RDW 14.0 14.4  ?PLT 114* 117*  ? ?Thyroid No results for input(s): TSH, FREET4 in the last 168 hours.  ?BNPNo results for input(s): BNP, PROBNP in the last 168 hours.  ?DDimer No results for input(s): DDIMER in the last 168 hours.  ? ?Radiology  ?  ?DG Chest 1 View ? ?Result Date: 11/21/2021 ?CLINICAL DATA:  Left-sided chest pain.  Back pain. EXAM: CHEST  1 VIEW COMPARISON:  None Available. FINDINGS: Left basilar   airspace disease which may reflect atelectasis versus pneumonia. No pleural effusion or pneumothorax. Heart and mediastinal contours are unremarkable. No acute osseous abnormality. IMPRESSION: 1. Left basilar airspace disease which may reflect atelectasis versus pneumonia. Electronically Signed   By: Elige Ko M.D.   On: 11/21/2021 12:11  ? ?CT Angio Chest/Abd/Pel for Dissection W and/or Wo Contrast ? ?Result Date: 11/21/2021 ?CLINICAL DATA:  Chest pain or back pain, aortic dissection suspected EXAM: CT ANGIOGRAPHY CHEST, ABDOMEN AND PELVIS TECHNIQUE: Non-contrast CT of the chest was initially obtained. Multidetector CT imaging through the chest, abdomen and pelvis was performed using the standard protocol during bolus administration of intravenous contrast. Multiplanar reconstructed images and MIPs were obtained and reviewed to evaluate the vascular anatomy. RADIATION DOSE REDUCTION: This exam was performed according to the departmental dose-optimization program which includes automated exposure  control, adjustment of the mA and/or kV according to patient size and/or use of iterative reconstruction technique. CONTRAST:  OMNIPAQUE IOHEXOL 350 MG/ML SOLN COMPARISON:  None Available. FINDINGS: CTA CHEST FINDINGS Cardiovascular: Preferential opacification of the thoracic aorta. No evidence of thoracic aortic aneurysm or dissection. There is moderate mixed atherosclerosis of the thoracic aorta. Normal heart size. No pericardial effusion. Coronary artery atherosclerosis. Mediastinum/Nodes: No lymphadenopathy.  Small hiatal hernia. Lungs/Pleura: There are multifocal ground-glass opacities bilaterally in the upper and lower lobes (right: Series 9, images 59, 99-103, left: Series 9, images 54 and 113). There is a solid 5 mm right medial basilar pulmonary nodule (series 9, image 115). No pleural effusion. No pneumothorax. Musculoskeletal: No acute osseous abnormality. No suspicious lytic or blastic lesions. Multilevel degenerative changes of the spine. Review of the MIP images confirms the above findings. CTA ABDOMEN AND PELVIS FINDINGS VASCULAR Aorta: Mixed atherosclerotic plaque throughout the abdominal aorta. There is an infrarenal abdominal aortic aneurysm measuring 4.3 x 4.2 cm (series 7, image 214), with prominent intramural thrombus resulting in focal 60% stenosis (series 7, image 211). Celiac: Patent without significant stenosis. SMA: Patent with mild stenosis proximally. Renals: Patent without significant stenosis. IMA: Patent. Inflow: There is mixed atherosclerosis of the iliac vasculature without significant stenosis, aneurysm, or dissection. 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 Veins: No obvious venous abnormality within the limitations of this arterial phase study. Review of the MIP images confirms the above findings. NON-VASCULAR Hepatobiliary: No focal liver abnormality is seen. The gallbladder is unremarkable. Pancreas: Unremarkable. No pancreatic ductal dilatation or surrounding  inflammatory changes. Spleen: Normal in size without focal abnormality. 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 No hydronephrosis or nephrolithiasis. Simple right inferior pole renal cyst. The bladder is mildly distended. There is a right posterolateral bladder diverticulum. Stomach/Bowel: Small hiatal hernia. The stomach is otherwise within normal limits. There is no evidence of bowel obstruction.The appendix is normal. There is extensive colonic diverticulosis. No evidence of acute diverticulitis. Lymphatic: No lymphadenopathy Reproductive: Unremarkable. Other: There is a small fat containing left inguinal hernia. No ascites. No free air. Musculoskeletal: No acute osseous abnormality. No suspicious lytic or blastic lesions. Multilevel degenerative changes of the spine. 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 Review of the MIP images confirms the above findings. IMPRESSION: CHEST: No acute thoracic aortic pathology. 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 Multifocal patchy ground-glass opacities bilaterally, most consistent with an infectious/inflammatory process. Recommend follow-up chest CT in 3 months. Solid 5 mm right medial basilar pulmonary nodule for which a 12 month follow-up CT can be considered, can also be  re-evaluated on the above recommended follow-up chest CT. ABDOMEN/PELVIS: Infrarenal abdominal aortic aneurysm measuring up to 4.3 cm, with prominent intramural thrombus resulting in 60% focal stenosis. No abdominal aortic dissection. Recommend follow-up every 12 months and vascular consultation. This recommendation follows ACR consensus guidelines: White Paper of the ACR Incidental Findings Committee II on Vascular Findings. J Am Coll Radiol 2013; 10:789-794. No other acute findings in the abdomen or pelvis. Extensive colonic diverticulosis.  Normal appendix.  Electronically Signed   By: Caprice Renshaw M.D.   On: 11/21/2021 14:20   ? ?Cardiac Studies  ? ? ? ?Patient Profile  ?   ?81 y.o. male obese male with h/o HTN, AAA, tobacco abuse, DM and sleep apnea presenting to the ED with c/o lef

## 2021-11-22 NOTE — Progress Notes (Signed)
ANTICOAGULATION CONSULT NOTE  ?Pharmacy Consult for Heparin ?Indication: chest pain/ACS ?Brief A/P: Heparin level within goal range Increase Heparin to keep in range ? ?No Known Allergies ? ?Patient Measurements: ?Height: 6'1" (185.4 cm) ?Weight: 122.3 kg (269 lb) ?IBW/kg (Calculated): 79.9 kg ?Heparin Dosing Weight: 106.6 kg  ? ?Vital Signs: ?BP: 94/66 (05/10 0021) ?Pulse Rate: 66 (05/10 0021) ? ?Labs: ?Recent Labs  ?  11/21/21 ?1130 11/21/21 ?1457 11/22/21 ?0037  ?HGB 14.0  --   --   ?HCT 41.2  --   --   ?PLT 114*  --   --   ?HEPARINUNFRC  --   --  0.31  ?CREATININE 1.33*  --   --   ?TROPONINIHS 572* 914*  --   ? ? ? ?CrCl cannot be calculated (Unknown ideal weight.). ? ?Assessment: ?81 y.o. male with chest pain for heparin  ? ?Goal of Therapy:  ?Heparin level 0.3-0.7 units/ml ?Monitor platelets by anticoagulation protocol: Yes ?  ?Plan:  ?Increase Heparin 1450 units/hr to keep in range ?Follow up after cath today  ? ?Phillis Knack, PharmD, BCPS ? ? ? ? ?

## 2021-11-22 NOTE — ED Notes (Signed)
Pt transferred to hospital bed for comfort.

## 2021-11-22 NOTE — Progress Notes (Signed)
ANTICOAGULATION CONSULT NOTE  ?Pharmacy Consult for Heparin ?Indication: chest pain/ACS ? ?No Known Allergies ? ?Patient Measurements: ?Height: 6'1" (185.4 cm) ?Weight: 122.3 kg (269 lb) ?IBW/kg (Calculated): 79.9 kg ?Heparin Dosing Weight: 106.6 kg  ? ?Vital Signs: ?BP: 134/77 (05/10 1000) ?Pulse Rate: 69 (05/10 1009) ? ?Labs: ?Recent Labs  ?  11/21/21 ?1130 11/21/21 ?1457 11/22/21 ?6237 11/22/21 ?6283 11/22/21 ?1011  ?HGB 14.0  --   --  13.9  --   ?HCT 41.2  --   --  41.6  --   ?PLT 114*  --   --  117*  --   ?HEPARINUNFRC  --   --  0.31  --  0.53  ?CREATININE 1.33*  --   --  1.32*  --   ?TROPONINIHS 572* 914*  --   --   --   ? ? ? ?CrCl cannot be calculated (Unknown ideal weight.). ? ?Assessment: ?81 y.o. male admitted with NSTEMI. Pharmacy has been consulted to manage heparin infusion per ACS protocol. Patient was not taking any anticoagulation prior to admission.  ? ?Heparin level = 0.53, therapeutic ?Current heparin infusion rate: 1450 units/hr ? ?Goal of Therapy:  ?Heparin level 0.3-0.7 units/ml ?Monitor platelets by anticoagulation protocol: Yes ?  ?Plan:  ?No change, continue heparin infusion at 14650 units/hr ?Plan for LHC this afternoon ?F/u anticoag plan post-cath  ? ?Wilburn Cornelia, PharmD, BCPS ?Clinical Pharmacist ?11/22/2021 11:29 AM  ? ?Please refer to [facility] for pharmacy phone number  ?

## 2021-11-23 ENCOUNTER — Encounter (HOSPITAL_COMMUNITY): Payer: Self-pay | Admitting: Cardiovascular Disease

## 2021-11-23 ENCOUNTER — Other Ambulatory Visit (HOSPITAL_COMMUNITY): Payer: Self-pay

## 2021-11-23 DIAGNOSIS — I2510 Atherosclerotic heart disease of native coronary artery without angina pectoris: Secondary | ICD-10-CM

## 2021-11-23 LAB — GLUCOSE, CAPILLARY
Glucose-Capillary: 240 mg/dL — ABNORMAL HIGH (ref 70–99)
Glucose-Capillary: 140 mg/dL — ABNORMAL HIGH (ref 70–99)

## 2021-11-23 LAB — BASIC METABOLIC PANEL
Anion gap: 7 (ref 5–15)
BUN: 12 mg/dL (ref 8–23)
CO2: 24 mmol/L (ref 22–32)
Calcium: 8.6 mg/dL — ABNORMAL LOW (ref 8.9–10.3)
Chloride: 109 mmol/L (ref 98–111)
Creatinine, Ser: 1.31 mg/dL — ABNORMAL HIGH (ref 0.61–1.24)
GFR, Estimated: 56 mL/min — ABNORMAL LOW (ref >60)
Glucose, Bld: 105 mg/dL — ABNORMAL HIGH (ref 70–99)
Potassium: 3.8 mmol/L (ref 3.5–5.1)
Sodium: 140 mmol/L (ref 135–145)

## 2021-11-23 LAB — CBC
HCT: 35.3 % — ABNORMAL LOW (ref 39.0–52.0)
Hemoglobin: 11.8 g/dL — ABNORMAL LOW (ref 13.0–17.0)
MCH: 32.5 pg (ref 26.0–34.0)
MCHC: 33.4 g/dL (ref 30.0–36.0)
MCV: 97.2 fL (ref 80.0–100.0)
Platelets: 86 10*3/uL — ABNORMAL LOW (ref 150–400)
RBC: 3.63 MIL/uL — ABNORMAL LOW (ref 4.22–5.81)
RDW: 14.2 % (ref 11.5–15.5)
WBC: 5.2 10*3/uL (ref 4.0–10.5)
nRBC: 0 % (ref 0.0–0.2)

## 2021-11-23 LAB — LIPOPROTEIN A (LPA): Lipoprotein (a): <8.4 nmol/L (ref <75.0)

## 2021-11-23 MED ORDER — ATORVASTATIN CALCIUM 80 MG PO TABS
80.0000 mg | ORAL_TABLET | Freq: Every day | ORAL | 2 refills | Status: AC
  Filled 2021-11-23: qty 30, 30d supply, fill #0

## 2021-11-23 MED ORDER — CLOPIDOGREL BISULFATE 75 MG PO TABS
75.0000 mg | ORAL_TABLET | Freq: Every day | ORAL | 3 refills | Status: AC
Start: 2021-11-23 — End: ?
  Filled 2021-11-23: qty 30, 30d supply, fill #0

## 2021-11-23 MED ORDER — CARVEDILOL 12.5 MG PO TABS
12.5000 mg | ORAL_TABLET | Freq: Two times a day (BID) | ORAL | 2 refills | Status: DC
  Filled 2021-11-23: qty 60, 30d supply, fill #0

## 2021-11-23 MED ORDER — METHOCARBAMOL 750 MG PO TABS
ORAL_TABLET | ORAL | 0 refills | Status: DC
  Filled 2021-11-23: qty 30, 10d supply, fill #0

## 2021-11-23 MED ORDER — NITROGLYCERIN 0.4 MG SL SUBL
0.4000 mg | SUBLINGUAL_TABLET | SUBLINGUAL | 1 refills | Status: AC | PRN
  Filled 2021-11-23: qty 25, 14d supply, fill #0

## 2021-11-23 NOTE — Discharge Summary (Addendum)
?Discharge Summary  ?  ?Patient ID: Kirk Baltimore Issa Sr. ?MRN: [id number]; DOB: 16-Apr-1941 ? ?Admit date: 11/21/2021 ?Discharge date: 11/23/2021 ? ?PCP:  Anson [place], MD ?  ?[facility] Providers ?Cardiologist:  New to Dr Angelena Form ? ? ?Discharge Diagnoses  ?  ?Principal Problem: ?  NSTEMI (non-ST elevated myocardial infarction) ([place]) ?Active Problems: ?  AAA (abdominal aortic aneurysm) without rupture ([place]) ?  Essential hypertension ?  Diabetes ([place]) ?  Nonrheumatic aortic valve stenosis ?  CAD (coronary artery disease) ? ? ? ?Diagnostic Studies/Procedures  ?  ?Right and left heart cath on 11/22/21: ?  ?  Mid LAD-1 lesion is 95% stenosed. ?  Mid LAD-2 lesion is 70% stenosed. ?  2nd Diag lesion is 60% stenosed. ?  A drug-eluting stent was successfully placed using a SYNERGY XD [id number]. ?  Post intervention, there is a 0% residual stenosis. ?  Post intervention, there is a 0% residual stenosis. ?  There is mild left ventricular systolic dysfunction. ?  LV end diastolic pressure is moderately elevated. ?  The left ventricular ejection fraction is 45-50% by visual estimate. ?  There is severe aortic valve stenosis. ?  ?1.  Left dominant coronary arteries with severe hazy stenosis in the mid LAD which is the likely culprit for non-ST elevation myocardial infarction. ?2.  Right heart catheterization showed normal filling pressures, minimal pulmonary hypertension and normal cardiac output. ?3.  Severely calcified aortic valve with restricted opening.  There is evidence of severe aortic stenosis with mean gradient of 56 mmHg and valve area of 0.87 cm?. ?4.  Successful angioplasty and drug-eluting stent placement to the mid LAD.  A long stent was used to cover the second lesion in the mid LAD.  The second diagonal was jailed by the stent but had normal flow and no significant ostial stenosis. ?  ?Recommendations: ?Dual antiplatelet therapy for at least 12 months. ?Aggressive treatment of risk factors. ?Evaluate for TAVR. ?   ? ? ?  ?Echo on 11/22/21: ?  ? 1. The aortic valve was not well visualized. There is severe  ?calcification of the aortic valve. There is severe thickening of the  ?aortic valve. Aortic valve regurgitation is trivial. Severe aortic valve  ?stenosis. Aortic valve mean gradient measures 40.0  ? mmHg. Aortic valve Vmax measures 4.35 m/s. AVA by continuity is 0.91cm2.  ?DI 0.26.  ? 2. Left ventricular ejection fraction, by estimation, is 60 to 65%. The  ?left ventricle has normal function. The left ventricle has no regional  ?wall motion abnormalities. There is mild concentric left ventricular  ?hypertrophy. Left ventricular diastolic  ?parameters are consistent with Grade I diastolic dysfunction (impaired  ?relaxation).  ? 3. Right ventricular systolic function is normal. The right ventricular  ?size is normal. Tricuspid regurgitation signal is inadequate for assessing  ?PA pressure.  ? 4. The mitral valve is grossly normal. Trivial mitral valve  ?regurgitation. No evidence of mitral stenosis.  ? 5. Aortic dilatation noted. There is borderline dilatation of the  ?ascending aorta, measuring 39 mm.  ? 6. The inferior vena cava is dilated in size with >50% respiratory  ?variability, suggesting right atrial pressure of 8 mmHg.  ? ?Comparison(s): No prior Echocardiogram. ?_____________ ?  ?History of Present Illness   ?  ?Per admission H&P: ? ?Kirk Eaden Sr. is a 81 y.o. male with past medical history of type 2 DM, tobacco use, hypertension, AAA, OSA who presented to the ER 11/21/2021 for the evaluation of chest pain with exertion.  ? ?  He is followed by the [place]. Patient is also scheduled to go to the [place] for cardiac catheterization at the end of May.  ?  ?Vital signs showed BP 119/97, HR 93, oxygen saturation 96% on room air. Labs in the ED showed Na 139, K 3.7, creatinine 1.33, WBC 5.7, hemoglobin 41.2, platelets 114. Initial hsTn 572.  ?EKG showed sinus rhythm, inverted T waves in leads I, aVL, V4-V6.  CXR showed left  basilar airspace disease that may reflect atelectasis vs pneumonia.  ?  ?On interview, patient reported that he has no known cardiac history. Has never had a heart attack or been told that he has heart failure. Patient started developing SOB on exertion in February 2023, underwent a nuclear stress test with CT attenuation through the [place] in March 2023. Study did not show ischemia, but CT did note coronary artery vascular calcification. 2 weeks ago, patient began to have chest pain on exertion in addition to his SOB. Chest pain was originally only on exertion and relieved with rest. Patient contacted his cardiologist at the [facility], and was scheduled for a cardiac catheterization on 5/27. However, his symptoms progressed and patient began to experience pain while at rest. Overnight, patient had a few episodes of waking up to urinate, and noticed chest pain. Woke up 11/21/2021 AM, walked to his kitchen, and noticed severe chest pain that did not resolve with rest. Pain was located on the left side of his chest, left shoulder, and down left arm to the elbow.  Did not radiate to neck or back. Of note, patient has some lower back pain that is related to a recent nerve ablation in his lumbar spine.  Denied any thoracic or cervical spine/back pain.  Reported associated SOB.  Denied nausea, vomiting, cough, fever, body aches, dizziness.  He was currently treated for HLD, HTN, diabetes. Quit smoking 1.5 years ago.  ?  ?  ?Hospital Course  ?   ?Consultants: N/A ? ?Non-STEMI ?CAD ?-Presented on 11/21/2021 with 2 weeks progression of chest pain ?- Hs trop 572 >914 ?- EKG with sinus rhythm and lateral TWI ?- s/p right and left heart cath on 11/22/21, found to have mid LAD severe stenosis which was treated with DES. Right heart catheterization showed normal filling pressures. Severe aortic stenosis with mean gradient of 56 mmHg and valve area of 0.87 cm?. ?- Echo from 5/10 with LVEF 60 to 65%, grade 1 DD, normal RV, trivial MR, borderline  dilatation of ascending aorta 39 mm, severe calcification of aortic valve with thickening, trivial AR, Aortic valve mean gradient measures 40.0 mmHg. Aortic valve Vmax measures 4.35 m/s. AVA by continuity is 0.91cm2. DI 0.26.  ?- LDL 41, A1C 6.7% ?-Medical therapy: Aspirin 81 mg + Plavix 75 mg for 1 year; atorvastatin 80 mg daily; carvedilol 12.5 mg twice daily; PRN Nitro for chest pain; STOP diclofenac, will send script to [facility] today before discharge. Further refills would come from [place].  ?-Post cath care and radial site care discussed in detail at bedside ?-Post cath follow up appointment arranged on 12/05/21, after that he will follow up with [facility] cardiology for CAD  ? ?Severe aortic stenosis ?- + DOE, leg swelling historically, no syncope thus far  ?- clinically euvolemic today ?-Outpatient referral to [facility] heart team, [facility], wishes to follow up with Dr Angelena Form on this issue ? ?Hypertension ?-Initially hypertensive at admission, blood pressure is now adequate controlled on carvedilol, will resume home  medication lisinopril and hydrochlorothiazide, monitor BP at home ? ?Hyperlipidemia ?-LDL 41 at goal ?-Increased Lipitor to 80 mg daily ? ?AAA ?- Patient has a 4.3 cm infrarenal AAA on CT this admission ?- Follows with Dr. Lucky Cowboy with vascular surgery  ? ?Type 2 diabetes ?-A1c 6.7%, adequately controlled ?-Continue home meds glimepiride, advised patient resume metformin 48 hours post cath on 11/24/2021 ? ?CKD II ?-Renal index stable postcatheterization, needs optimal control of hypertension and diabetes ? ? ? ? ?Did the patient have an acute coronary syndrome (MI, NSTEMI, STEMI, etc) this admission?:  Yes                              ? ?AHA/ACC Clinical Performance & Quality Measures: ?Aspirin prescribed? - Yes ?ADP Receptor Inhibitor (Plavix/Clopidogrel, Brilinta/Ticagrelor or Effient/Prasugrel) prescribed (includes medically managed patients)? - Yes ?Beta  Blocker prescribed? - Yes ?High Intensity Statin (Lipitor 40-80mg  or Crestor 20-40mg ) prescribed? - Yes ?EF assessed during THIS hospitalization? - Yes ?For EF <40%, was ACEI/ARB prescribed? - Yes ?For EF <40%, A

## 2021-11-23 NOTE — Plan of Care (Signed)

## 2021-11-23 NOTE — Progress Notes (Signed)
Discharge instructions, RX's and follow up appts explained and provided to patient verbalized understanding. ° °Terilyn Sano Lynn, RN ° °

## 2021-11-23 NOTE — Progress Notes (Signed)
CARDIAC REHAB PHASE I  ? ?PRE:  Rate/Rhythm: 67 SR ? ?BP:  Sitting: 128/85     ? ?SaO2: 95 RA ? ?MODE:  Ambulation: 75 ft  ? ?POST:  Rate/Rhythm: 94 SR ? ?BP:  Sitting: 139/86 ? ?  SaO2: 94 RA ? ? ?Pt ambulated 39ft in hallway standby assist with slow, steady gait. Pt c/o baseline back pain, denies CP, SOB, or dizziness. Pt returned to recliner. Pt educated on importance of ASA and Plavix. Pt given stent card along with heart healthy and diabetic diets. Reviewed site care and restrictions. Encouraged continued ambulation with emphasis on safety. Will refer to CRP II [place]. ? ?[phone] ?Reynold Bowen, RN BSN ?11/23/2021 ?9:44 AM ? ?

## 2021-11-23 NOTE — Progress Notes (Signed)
? 11/23/21 0930  ?Clinical Encounter Type  ?Visited With Patient  ?Visit Type Initial ?(Advance Directive)  ?Referral From Nurse ?([name], RN)  ?Consult/Referral To Chaplain ?Gelene Mink [place])  ? ?Chaplain responded to a consult request placed by [name] RN, for Advance Directive education to [name] ?  ?Chaplain provided the Advance Directive packet as well as education on Advance Directives-documents an individual completes to communicate their health care directions in advance of a time when they may need them. Chaplain informed [name] the documents which may be completed here in the hospital are the Living Will and Health Care Power of Attorney. ?  ?Chaplain informed patient that the Health Care Power of Kirk Roberts is a legal document in which an individual names another person, as their Health Care Agent, to make health care decisions when the individual is not able to make them for themselves. The Health Care Agent's function can be temporary or permanent depending on his ability to make and communicate those decisions independently. Chaplain informed [name] in the absence of a Health Care Power of Kirk Roberts, the state of [place] directs health care providers to look to the following individuals in the order listed: legal guardian; an attorney-in-fact under a general power of attorney (POA) if that POA includes the right to make health care decisions; person's spouse; a majority of his children; a majority of adult brothers and sisters; or an individual who has an established relationship with you, who is acting in good faith and who can convey your wishes.  If none of these persons are available or willing to make medical decisions on a patient's behalf, the law allows the patient's doctor to make decisions for them as long as another doctor agrees with those decisions.  Chaplain also informed the patient that the Health Care agent has no decision-making  authority over any affairs other than those related to his medical care. ?  ?The chaplain further educated [name] that a Living Will is a legal document that allows his desire not to receive life-prolonging measures in the event that they have a condition that is incurable and will result in his death in a short period of time; they are unconscious, and doctors are confident that they will not regain consciousness; and/or they have advanced dementia or other substantial and irreversible loss of mental function. The chaplain informed [name] that life-prolonging measures are medical treatments that would only serve to postpone death, including breathing machines, kidney dialysis, antibiotics, artificial nutrition and hydration (tube feeding), and similar forms of treatment and that if an individual is able to express their wishes, they may also make them known without the use of a Living Will, but in the event that he is not able to express his wishes, a Living Will allows medical providers and the his/her family and Roberts to ensure that they are not making decisions on the his behalf, but rather serving as the his voice to convey decisions that he has already made. ?  ?[name] is aware that the decision to create an advance directive is his alone and he may choose not to complete the documents or may choose to complete one portion or both.  [name] was informed that he can revoke the documents at any time by striking through them and writing void or by completing new documents, but that it is also advisable that the individual verbally notify interested parties that their wishes have changed. ?  ?Mr.  Kirk Roberts is also aware that the document must be signed in the presence of a notary public and two witnesses and that this can be done while the patient is still admitted to the hospital or after discharge in the community. If they decide to complete Advance Directives after being discharged from  the hospital, they have been advised to notify all interested parties and to provide those documents to their physicians and loved ones in addition to bringing them to the hospital in the event of another hospitalization. ? ?[name] stated that he was naming his son Kirk Roberts, Kirk Roberts. as H.C.P.O.A. ?[name] stated that he planned on being discharged this morning and will take the forms to a notary for signing and then  bring them to his next doctor appointment. ? ?Kirk Roberts, Kirk Roberts., [phone]. ?

## 2021-11-24 LAB — LIPOPROTEIN A (LPA): Lipoprotein (a): <8.4 nmol/L (ref <75.0)

## 2021-11-30 ENCOUNTER — Telehealth (HOSPITAL_COMMUNITY): Payer: Self-pay

## 2021-11-30 ENCOUNTER — Telehealth: Payer: Self-pay | Admitting: Cardiovascular Disease

## 2021-11-30 NOTE — Telephone Encounter (Signed)
Pt wants to use his VA insurance for cardiac rehab. Nurse navigator Carlette will be calling the VA on the pt behalf to see about getting an authorization for his cardiology services because there is not a [place] auth in his account.

## 2021-11-30 NOTE — Telephone Encounter (Signed)
Pt stated that he is having some back problems and that he is not able to do cardiac rehab right now but he is interested in starting at a later date.

## 2021-11-30 NOTE — Telephone Encounter (Signed)
Spoke with the patient who states that his blood pressure has been running low. He states that it is typically on the lower side. He denies any lightheadedness or dizziness. He states he has been feeling fine. He had a headache earlier but took Tylenol and it was relieved. He states that the [place] recommend he cut his Coreg in half to 6.25 mg BID. He states that he started this last night. He took his BP while on the phone and it was 105/66. Advised to continue to monitor and ensure he is staying hydrated.

## 2021-11-30 NOTE — Telephone Encounter (Signed)
Pt c/o BP issue: STAT if pt c/o blurred vision, one-sided weakness or slurred speech  1. What are your last 5 BP readings?   5/14: 89/62 5/15: 90/61         107/66 5/16: 82/54          96/62 5/17: 84/55          76/54 5/18: 84/54  2. Are you having any other symptoms (ex. Dizziness, headache, blurred vision, passed out)?  Slight headache, but patient took Tylenol and it helped. No other symptoms.  3. What is your BP issue?   Patient states his BP has been low. Systolic hasn't gotten above 99. Went to [place] yesterday and was advised to cut back on his Coreg. Patient seeking further advisement.

## 2021-12-04 DIAGNOSIS — I700 Atherosclerosis of aorta: Secondary | ICD-10-CM | POA: Insufficient documentation

## 2021-12-04 DIAGNOSIS — N183 Chronic kidney disease, stage 3 unspecified: Secondary | ICD-10-CM | POA: Insufficient documentation

## 2021-12-04 DIAGNOSIS — E785 Hyperlipidemia, unspecified: Secondary | ICD-10-CM | POA: Insufficient documentation

## 2021-12-04 DIAGNOSIS — R911 Solitary pulmonary nodule: Secondary | ICD-10-CM | POA: Insufficient documentation

## 2021-12-04 NOTE — Progress Notes (Unsigned)
Cardiology Office Note:    Date:  12/05/2021   ID:  Kirk Silence Sr., DOB April 26, 1941, MRN [id number]  PCP:  Anson Oregon, MD  [facility] Providers Cardiologist:  Lauree Chandler, MD    Referring MD: Anson Oregon, MD   Chief Complaint:  Hospitalization Follow-up (S/p non-STEMI)    Patient Profile: Coronary artery disease NSTEMI 11/2021 s/p DES to mLAD Severe aortic stenosis  Echocardiogram 11/2021: Mean 40 mmHg, Vmax 435 cm/s, DI 0.26 Abdominal aortic aneurysm  CT 11/2021: 4.3 cm; intramural thrombus 60% - Annual F/u recommended  Hypertension  Hyperlipidemia  Diabetes mellitus  Chronic kidney disease  +Cigs Hx OSA Aortic atherosclerosis  Lung nodule CT 11/2021: 5 mm RML - F/u 1 year   Prior CV Studies: RIGHT/LEFT HEART CATH AND CORONARY ANGIOGRAPHY 11/22/2021 LAD mid 95, 70; D2 60 EF 45-50 Normal filling pressures, minimal pulmonary hypertension, normal CO Severe aortic stenosis, mean gradient 56 mmHg, AVA 0.87 cm PCI: 3 x 48 mm Synergy DES to the LAD     ECHO COMPLETE WO IMAGING ENHANCING AGENT 11/22/2021 Severe aortic stenosis (mean gradient 40 mmHg, V-max 435 cm/s, DI 0.26), trivial AI, EF 60-65, no RWMA, mild LVH, GR 1 DD, normal RVSF, trivial MR, borderline dilation of ascending aorta (39 mm)      History of Present Illness:   Kirk Rabalais Sr. is a 81 y.o. male with the above problem list.  He was admitted 5/9-5/11 with a NSTEMI.  He is followed by Cardiology a the [facility].  He was to undergo a cardiac catheterization for anginal symptoms soon but his chest pain progressed and he came to [facility].  His hsTrop ? to 914.  Cardiac catheterization demonstrated severe mLAD stenosis which was tx with a DES.  R/L cardiac catheterization and echocardiogram confirmed severe AS with mean gradient 40 mmHg, Vmas 435 cm/s and DI 0.26.  The plan is for OP f/u with Dr. Angelena Form in the [facility] for TAVR evaluation.        He returns for posthospitalization  follow-up.  Since discharge, he has been doing well.  He has not had chest pain, shortness of breath, syncope, orthopnea, leg edema.  His blood pressures have been low at times.  He has noted some headaches.  His carvedilol dose had been reduced at the [place] to 6.25 mg twice daily.  He tried resuming 12.5 mg twice daily with systolic blood pressures in the 80s and 90s.  Therefore, he went back to 6.25 mg twice daily.     Past Medical History:  Diagnosis Date   Arthritis    oa   Back pain    at times   Diabetes mellitus without complication (HCC)    diet controlled   Hard of hearing    Heart murmur    Hypertension    Measles 1950's   Mumps 1950's   Nocturia    Shortness of breath    on exertion   Sleep apnea    no CPAP   Current Medications: Current Meds  Medication Sig   acetaminophen (TYLENOL) 325 MG tablet Take 2 tablets (650 mg total) by mouth every 6 (six) hours as needed for mild pain (or Fever >/= 101).   aspirin EC 81 MG tablet Take 81 mg by mouth daily. Swallow whole.   atorvastatin (LIPITOR) 80 MG tablet Take 1 tablet (80 mg total) by mouth daily.   carvedilol (COREG) 12.5 MG tablet Take 6.25 mg by mouth 2 (two) times daily with a  meal.   Cholecalciferol 50 MCG (2000 UT) TABS Take 1 tablet by mouth daily.   clopidogrel (PLAVIX) 75 MG tablet Take 1 tablet (75 mg total) by mouth daily with breakfast.   diclofenac Sodium (VOLTAREN) 1 % GEL APPLY 2 GRAMS TO AFFECTED AREA THREE TIMES A DAY   glimepiride (AMARYL) 2 MG tablet TAKE ONE TABLET BY MOUTH EVERY MORNING FOR DIABETES   latanoprost (XALATAN) 0.005 % ophthalmic solution Place 1 drop into both eyes at bedtime.   lidocaine (LIDODERM) 5 % APPLY 1 PATCH TO SKIN AS DIRECTED BY YOUR MEDICAL PROVIDER AS NEEDED (APPLY FOR 12 HOURS, THEN REMOVE FOR 12 HOURS) PLACE 12 HOURS ON AND 12 HOURS OFF   lisinopril-hydrochlorothiazide (ZESTORETIC) 20-12.5 MG tablet Take 1/2 tablet by mouth daily   metFORMIN (GLUCOPHAGE-XR) 500 MG 24 hr tablet  TAKE TWO TABLETS BY MOUTH BEFORE BREAKFAST AND TAKE ONE TABLET AT BEDTIME FOR DIABETES (ANNUAL KIDNEY FUNCTION TESTING IS NEEDED)   methocarbamol (ROBAXIN) 750 MG tablet TAKE ONE TABLET BY MOUTH THREE TIMES A DAY AS NEEDED *MAY CAUSE SLEEPINESS; DO NOT DRIVE AFTER TAKING IT   nitroGLYCERIN (NITROSTAT) 0.4 MG SL tablet Place 1 tablet (0.4 mg total) under the tongue every 5 (five) minutes as needed for chest pain.   traZODone (DESYREL) 100 MG tablet Take 1 tablet by mouth at bedtime.   vitamin B-12 (CYANOCOBALAMIN) 500 MCG tablet Take 2 tablets by mouth daily.   [DISCONTINUED] carvedilol (COREG) 25 MG tablet Take 1/2 tablet by mouth in the morning and 1/2 tablet in the evening   [DISCONTINUED] lisinopril-hydrochlorothiazide (ZESTORETIC) 20-12.5 MG tablet Take 1 tablet by mouth every morning.    Allergies:   Patient has no known allergies.   Social History   Tobacco Use   Smoking status: Some Days    Packs/day: 0.50    Years: 40.00    Pack years: 20.00    Types: Cigarettes   Smokeless tobacco: Never  Substance Use Topics   Alcohol use: No   Drug use: No    Family Hx: The patient's family history is not on file.  Review of Systems  Hematologic/Lymphatic: Bruises/bleeds easily.  Neurological:  Positive for headaches.    EKGs/Labs/Other Test Reviewed:    EKG:  EKG is  ordered today.  The ekg ordered today demonstrates NSR, HR 69, normal axis, anterolateral T wave inversions, QTc 432  Recent Labs: 02/21/2021: ALT 32 11/23/2021: BUN 12; Creatinine, Ser 1.31; Hemoglobin 11.8; Platelets 86; Potassium 3.8; Sodium 140   Recent Lipid Panel Recent Labs    11/22/21 0412  CHOL 118  TRIG 174*  HDL 42  VLDL 35  LDLCALC 41     Risk Assessment/Calculations:         Physical Exam:    VS:  BP (!) 100/40   Pulse 62   Ht 6\' 1"  (1.854 m)   Wt 264 lb 12.8 oz (120.1 kg)   SpO2 96%   BMI 34.94 kg/m     Wt Readings from Last 3 Encounters:  12/05/21 264 lb 12.8 oz (120.1 kg)  11/23/21  271 lb 6.2 oz (123.1 kg)  10/03/21 269 lb (122 kg)    Constitutional:      Appearance: Healthy appearance. Not in distress.  Neck:     Vascular: JVD normal.  Pulmonary:     Effort: Pulmonary effort is normal.     Breath sounds: No wheezing. No rales.  Cardiovascular:     Normal rate. Regular rhythm. Normal S1. Normal S2.  Murmurs: There is a grade 3/6 systolic murmur at the URSB.     Comments: Right wrist with ecchymosis but no hematoma Edema:    Peripheral edema absent.  Abdominal:     Palpations: Abdomen is soft.  Skin:    General: Skin is warm and dry.  Neurological:     General: No focal deficit present.     Mental Status: Alert and oriented to person, place and time.     Cranial Nerves: Cranial nerves are intact.        ASSESSMENT & PLAN:   NSTEMI (non-ST elevated myocardial infarction) [facility]) He was recent admitted with a non-STEMI treated a DES to the mid LAD.  He had no significant disease in the RCA or LCx.  He is doing well without anginal symptoms.  We discussed the importance of dual antiplatelet therapy for minimum of 12 months post MI.  Continue aspirin 81 mg daily, Lipitor 80 mg daily, carvedilol 6.25 mg twice daily, Plavix 75 mg daily.  At this time, I would hold off on pursuing cardiac rehabilitation until he is evaluated further for TAVR.  Nonrheumatic aortic valve stenosis Severe aortic stenosis by cardiac catheterization and echocardiogram.  He has not had recurrent chest pain.  He has not had syncope or evidence of volume excess.  He will follow up with Dr. Angelena Form June 2 to evaluate for TAVR.  AAA (abdominal aortic aneurysm) without rupture (HCC) 4.3 cm by CT scan in May 2023.  He will need follow-up imaging in 1 year.  Aortic atherosclerosis (HCC) Continue aspirin, statin.  Essential hypertension Blood pressure is running somewhat low.  Given his severe aortic stenosis, we do not want his pressure to run too low.  Continue carvedilol 6.25 mg twice  daily.  Decrease lisinopril HCTZ to 10/6.25 mg daily.  Hyperlipidemia LDL goal <70 Continue atorvastatin 80 mg daily.  Arrange CMET, lipids in 3 months.     Cardiac Rehabilitation Eligibility Assessment  The patient is NOT ready to start cardiac rehabilitation due to: Other (Awaiting evaluation for TAVR)         Dispo:  Return in 10 days (on 12/15/2021) for Scheduled Follow Up with Dr. Angelena Form.   Medication Adjustments/Labs and Tests Ordered: Current medicines are reviewed at length with the patient today.  Concerns regarding medicines are outlined above.  Tests Ordered: Orders Placed This Encounter  Procedures   Comp Met (CMET)   Lipid panel   EKG 12-Lead   Medication Changes: Meds ordered this encounter  Medications   lisinopril-hydrochlorothiazide (ZESTORETIC) 20-12.5 MG tablet    Sig: Take 1/2 tablet by mouth daily    Dispense:  45 tablet    Refill:  1   Signed, Richardson Dopp, PA-C  12/05/2021 5:13 PM    [facility] [address] [place], [place], [place]  [address] Phone: [phone]; Fax: [phone]

## 2021-12-05 ENCOUNTER — Encounter: Payer: Self-pay | Admitting: Physician Assistant

## 2021-12-05 ENCOUNTER — Ambulatory Visit (INDEPENDENT_AMBULATORY_CARE_PROVIDER_SITE_OTHER): Payer: No Typology Code available for payment source | Admitting: Physician Assistant

## 2021-12-05 VITALS — BP 100/40 | HR 62 | Ht 73.0 in | Wt 264.8 lb

## 2021-12-05 DIAGNOSIS — I350 Nonrheumatic aortic (valve) stenosis: Secondary | ICD-10-CM

## 2021-12-05 DIAGNOSIS — R911 Solitary pulmonary nodule: Secondary | ICD-10-CM

## 2021-12-05 DIAGNOSIS — I10 Essential (primary) hypertension: Secondary | ICD-10-CM

## 2021-12-05 DIAGNOSIS — I7143 Infrarenal abdominal aortic aneurysm, without rupture: Secondary | ICD-10-CM

## 2021-12-05 DIAGNOSIS — I214 Non-ST elevation (NSTEMI) myocardial infarction: Secondary | ICD-10-CM

## 2021-12-05 DIAGNOSIS — I700 Atherosclerosis of aorta: Secondary | ICD-10-CM

## 2021-12-05 DIAGNOSIS — E118 Type 2 diabetes mellitus with unspecified complications: Secondary | ICD-10-CM

## 2021-12-05 DIAGNOSIS — N1831 Chronic kidney disease, stage 3a: Secondary | ICD-10-CM

## 2021-12-05 DIAGNOSIS — E785 Hyperlipidemia, unspecified: Secondary | ICD-10-CM

## 2021-12-05 MED ORDER — LISINOPRIL-HYDROCHLOROTHIAZIDE 20-12.5 MG PO TABS: ORAL_TABLET | ORAL | 1 refills | Status: DC

## 2021-12-05 NOTE — Assessment & Plan Note (Signed)
Continue aspirin, statin.  

## 2021-12-05 NOTE — Assessment & Plan Note (Signed)
Blood pressure is running somewhat low.  Given his severe aortic stenosis, we do not want his pressure to run too low.  Continue carvedilol 6.25 mg twice daily.  Decrease lisinopril HCTZ to 10/6.25 mg daily.

## 2021-12-05 NOTE — Assessment & Plan Note (Signed)
Continue atorvastatin 80 mg daily.  Arrange CMET, lipids in 3 months.

## 2021-12-05 NOTE — Assessment & Plan Note (Signed)
4.3 cm by CT scan in May 2023.  He will need follow-up imaging in 1 year.

## 2021-12-05 NOTE — Assessment & Plan Note (Signed)
Severe aortic stenosis by cardiac catheterization and echocardiogram.  He has not had recurrent chest pain.  He has not had syncope or evidence of volume excess.  He will follow up with Dr. Clifton James June 2 to evaluate for TAVR.

## 2021-12-05 NOTE — Patient Instructions (Signed)
Medication Instructions:  Your physician has recommended you make the following change in your medication:   REDUCE the Lisinopril-HCTZ to 1/2 tablet daily    *If you need a refill on your cardiac medications before your next appointment, please call your pharmacy*   Lab Work: 3 MONTHS:  03/09/22 come anytime after 7:15 a.m.:  FASTING CMET & LIPIDS  If you have labs (blood work) drawn today and your tests are completely normal, you will receive your results only by: MyChart Message (if you have MyChart) OR A paper copy in the mail If you have any lab test that is abnormal or we need to change your treatment, we will call you to review the results.   Testing/Procedures: None ordered   Follow-Up: At [facility], you and your health needs are our priority.  As part of our continuing mission to provide you with exceptional heart care, we have created designated Provider Care Teams.  These Care Teams include your primary Cardiologist (physician) and Advanced Practice Providers (APPs -  Physician Assistants and Nurse Practitioners) who all work together to provide you with the care you need, when you need it.  We recommend signing up for the patient portal called "MyChart".  Sign up information is provided on this After Visit Summary.  MyChart is used to connect with patients for Virtual Visits (Telemedicine).  Patients are able to view lab/test results, encounter notes, upcoming appointments, etc.  Non-urgent messages can be sent to your provider as well.   To learn more about what you can do with MyChart, go to [email or web address].    Your next appointment:   In June as scheduled  The format for your next appointment:   In Person  Provider:   Verne Carrow, MD     Other Instructions   Important Information About Sugar

## 2021-12-05 NOTE — Assessment & Plan Note (Signed)
He was recent admitted with a non-STEMI treated a DES to the mid LAD.  He had no significant disease in the RCA or LCx.  He is doing well without anginal symptoms.  We discussed the importance of dual antiplatelet therapy for minimum of 12 months post MI.  Continue aspirin 81 mg daily, Lipitor 80 mg daily, carvedilol 6.25 mg twice daily, Plavix 75 mg daily.  At this time, I would hold off on pursuing cardiac rehabilitation until he is evaluated further for TAVR.

## 2021-12-06 ENCOUNTER — Telehealth: Payer: Self-pay | Admitting: Physician Assistant

## 2021-12-06 MED ORDER — LISINOPRIL-HYDROCHLOROTHIAZIDE 10-12.5 MG PO TABS: 1.0000 | ORAL_TABLET | Freq: Every day | ORAL | 3 refills | Status: AC

## 2021-12-06 NOTE — Telephone Encounter (Signed)
It does not. I was going to have him take a 1/2 pill to make it easier on him. We will have to change his dose to Lisinopril 10/12.5 mg once daily. He can take a 1/2 tab of the 20/12.5 until he gets the new Rx for the lower dose. Tereso Newcomer, PA-C    12/06/2021 4:49 PM

## 2021-12-06 NOTE — Telephone Encounter (Signed)
Pt is requesting a refill on lisinopril-HCTZ 20-12.5 mg tablets that he does not have to cut in half. Please address

## 2021-12-06 NOTE — Telephone Encounter (Signed)
*  STAT* If patient is at the pharmacy, call can be transferred to refill team.   1. Which medications need to be refilled? (please list name of each medication and dose if known)  lisinopril-hydrochlorothiazide (ZESTORETIC) 20-12.5 MG tablet   2. Which pharmacy/location (including street and city if local pharmacy) is medication to be sent to? [facility] - [place], [place] - [address] Attn: DR. Chilton Si, [facility]   3. Do they need a 30 day or 90 day supply? 90 with refills  Patient would like a pill that he does not have to cut in half.

## 2021-12-06 NOTE — Telephone Encounter (Signed)
Patient notified.  Prescription for lisinopril-HCTZ 10/12.5 mg daily sent to [facility]

## 2021-12-14 ENCOUNTER — Other Ambulatory Visit: Payer: Self-pay

## 2021-12-14 DIAGNOSIS — I350 Nonrheumatic aortic (valve) stenosis: Secondary | ICD-10-CM

## 2021-12-15 ENCOUNTER — Ambulatory Visit (INDEPENDENT_AMBULATORY_CARE_PROVIDER_SITE_OTHER): Payer: No Typology Code available for payment source | Admitting: Cardiovascular Disease

## 2021-12-15 ENCOUNTER — Encounter: Payer: Self-pay | Admitting: Cardiovascular Disease

## 2021-12-15 VITALS — BP 110/70 | HR 60 | Ht 73.0 in | Wt 266.2 lb

## 2021-12-15 DIAGNOSIS — I350 Nonrheumatic aortic (valve) stenosis: Secondary | ICD-10-CM | POA: Diagnosis not present

## 2021-12-15 NOTE — Progress Notes (Signed)
Structural Heart Clinic Note  Chief Complaint  Patient presents with   Follow-up    Aortic stenosis   History of Present Illness:81 yo male with history of diabetes mellitus, former tobacco abuse, HTN, AAA, sleep apnea, CAD and severe aortic stenosis who is here today for cardiac follow up. He was admitted to [facility] 11/21/21 with dyspnea and left arm pain and was found to have a NSTEMI. Cardiac cath with severe mid LAD stenosis treated with a drug eluting stent. Mean AV gradient 56 mmHg with AVA 0.87 cm2. Echo 11/22/21 with LVEF=60-65%. Normal RV function. Severe aortic stenosis with mean gradient of 40 mmHg, peak gradient 75.7 mmHg, AVA 0.92cm2. He was discharged on ASA and Plavix.   He tells me today that he feels much better following his PCI. He denies chest pain, dyspnea, LE edema, dizziness or near syncope. He is mainly limited by back pain.  He lives in [place], [place] alone. He is retired from Psychologist, educational. He has upper dentures and no active issues with his bottom teeth. He sees the dentist every six months.    Primary Care Physician: Anson Oregon, MD Primary Cardiologist: Angelena Form  Past Medical History:  Diagnosis Date   Arthritis    oa   Back pain    at times   Diabetes mellitus without complication ([place])    diet controlled   Hard of hearing    Heart murmur    Hypertension    Measles 1950's   Mumps 1950's   Nocturia    Shortness of breath    on exertion   Sleep apnea    no CPAP    Past Surgical History:  Procedure Laterality Date   colonscopy     CORONARY STENT INTERVENTION N/A 11/22/2021   Procedure: CORONARY STENT INTERVENTION;  Surgeon: Wellington Hampshire, MD;  Location: [facility];  Service: Cardiovascular;  Laterality: N/A;   left partial knee replacement  2001   RIGHT/LEFT HEART CATH AND CORONARY ANGIOGRAPHY N/A 11/22/2021   Procedure: RIGHT/LEFT HEART CATH AND CORONARY ANGIOGRAPHY;  Surgeon: Wellington Hampshire, MD;  Location: [facility];  Service:  Cardiovascular;  Laterality: N/A;   TOTAL KNEE ARTHROPLASTY Right 05/12/2014   Procedure: RIGHT TOTAL KNEE ARTHROPLASTY;  Surgeon: Gearlean Alf, MD;  Location: [facility];  Service: Orthopedics;  Laterality: Right;    Current Outpatient Medications  Medication Sig Dispense Refill   acetaminophen (TYLENOL) 325 MG tablet Take 2 tablets (650 mg total) by mouth every 6 (six) hours as needed for mild pain (or Fever >/= 101). 40 tablet 0   aspirin EC 81 MG tablet Take 81 mg by mouth daily. Swallow whole.     atorvastatin (LIPITOR) 80 MG tablet Take 1 tablet (80 mg total) by mouth daily. 30 tablet 2   carvedilol (COREG) 12.5 MG tablet Take 6.25 mg by mouth 2 (two) times daily with a meal.     Cholecalciferol 50 MCG (2000 UT) TABS Take 1 tablet by mouth daily.     clopidogrel (PLAVIX) 75 MG tablet Take 1 tablet (75 mg total) by mouth daily with breakfast. 30 tablet 3   diclofenac Sodium (VOLTAREN) 1 % GEL APPLY 2 GRAMS TO AFFECTED AREA THREE TIMES A DAY     glimepiride (AMARYL) 2 MG tablet TAKE ONE TABLET BY MOUTH EVERY MORNING FOR DIABETES     latanoprost (XALATAN) 0.005 % ophthalmic solution Place 1 drop into both eyes at bedtime.     lidocaine (LIDODERM) 5 % APPLY  1 PATCH TO SKIN AS DIRECTED BY YOUR MEDICAL PROVIDER AS NEEDED (APPLY FOR 12 HOURS, THEN REMOVE FOR 12 HOURS) PLACE 12 HOURS ON AND 12 HOURS OFF     lisinopril-hydrochlorothiazide (ZESTORETIC) 10-12.5 MG tablet Take 1 tablet by mouth daily. 90 tablet 3   metFORMIN (GLUCOPHAGE-XR) 500 MG 24 hr tablet TAKE TWO TABLETS BY MOUTH BEFORE BREAKFAST AND TAKE ONE TABLET AT BEDTIME FOR DIABETES (ANNUAL KIDNEY FUNCTION TESTING IS NEEDED)     methocarbamol (ROBAXIN) 750 MG tablet TAKE ONE TABLET BY MOUTH THREE TIMES A DAY AS NEEDED *MAY CAUSE SLEEPINESS; DO NOT DRIVE AFTER TAKING IT 30 tablet 0   nitroGLYCERIN (NITROSTAT) 0.4 MG SL tablet Place 1 tablet (0.4 mg total) under the tongue every 5 (five) minutes as needed for chest pain. 25 tablet 1    traZODone (DESYREL) 100 MG tablet Take 1 tablet by mouth at bedtime.     vitamin B-12 (CYANOCOBALAMIN) 500 MCG tablet Take 2 tablets by mouth daily.     No current facility-administered medications for this visit.    No Known Allergies  Social History   Socioeconomic History   Marital status: Widowed    Spouse name: Not on file   Number of children: 2   Years of education: Not on file   Highest education level: Not on file  Occupational History   Occupation: Retired Librarian, academic  Tobacco Use   Smoking status: Former    Packs/day: 0.50    Years: 40.00    Pack years: 20.00    Types: Cigarettes    Quit date: 07/15/2020    Years since quitting: 1.4   Smokeless tobacco: Never  Substance and Sexual Activity   Alcohol use: No   Drug use: No   Sexual activity: Not on file  Other Topics Concern   Not on file  Social History Narrative   Not on file   Social Determinants of Health   Financial Resource Strain: Not on file  Food Insecurity: Not on file  Transportation Needs: Not on file  Physical Activity: Not on file  Stress: Not on file  Social Connections: Not on file  Intimate Partner Violence: Not on file    Family History  Problem Relation Age of Onset   Breast cancer Sister        both sisters   Lung cancer Brother        both brothers    Review of Systems:  As stated in the HPI and otherwise negative.   BP 110/70   Pulse 60   Ht _0  (1.854 m)   Wt 266 lb 3.2 oz (120.7 kg)   SpO2 97%   BMI 35.12 kg/m   Physical Examination: General: Well developed, well nourished, NAD  HEENT: OP clear, mucus membranes moist  SKIN: warm, dry. No rashes. Neuro: No focal deficits  Musculoskeletal: Muscle strength 5/5 all ext  Psychiatric: Mood and affect normal  Neck: No JVD, no carotid bruits, no thyromegaly, no lymphadenopathy.  Lungs:Clear bilaterally, no wheezes, rhonci, crackles Cardiovascular: Regular rate and rhythm. Loud, harsh, late peaking  systolic murmur.  Abdomen:Soft. Bowel sounds present. Non-tender.  Extremities: No lower extremity edema. Pulses are 2 + in the bilateral DP/PT.  EKG:  EKG is not ordered today. The ekg ordered today demonstrates   Echo 11/22/21:  1. The aortic valve was not well visualized. There is severe  calcification of the aortic valve. There is severe thickening of the  aortic valve. Aortic valve regurgitation is  trivial. Severe aortic valve  stenosis. Aortic valve mean gradient measures 40.0   mmHg. Aortic valve Vmax measures 4.35 m/s. AVA by continuity is 0.91cm2.  DI 0.26.   2. Left ventricular ejection fraction, by estimation, is 60 to 65%. The  left ventricle has normal function. The left ventricle has no regional  wall motion abnormalities. There is mild concentric left ventricular  hypertrophy. Left ventricular diastolic  parameters are consistent with Grade I diastolic dysfunction (impaired  relaxation).   3. Right ventricular systolic function is normal. The right ventricular  size is normal. Tricuspid regurgitation signal is inadequate for assessing  PA pressure.   4. The mitral valve is grossly normal. Trivial mitral valve  regurgitation. No evidence of mitral stenosis.   5. Aortic dilatation noted. There is borderline dilatation of the  ascending aorta, measuring 39 mm.   6. The inferior vena cava is dilated in size with >50% respiratory  variability, suggesting right atrial pressure of 8 mmHg.   Comparison(s): No prior Echocardiogram.   FINDINGS   Left Ventricle: Left ventricular ejection fraction, by estimation, is 60  to 65%. The left ventricle has normal function. The left ventricle has no  regional wall motion abnormalities. The left ventricular internal cavity  size was normal in size. There is   mild concentric left ventricular hypertrophy. Left ventricular diastolic  parameters are consistent with Grade I diastolic dysfunction (impaired  relaxation).   Right  Ventricle: The right ventricular size is normal. No increase in  right ventricular wall thickness. Right ventricular systolic function is  normal. Tricuspid regurgitation signal is inadequate for assessing PA  pressure.   Left Atrium: Left atrial size was normal in size.   Right Atrium: Right atrial size was normal in size.   Pericardium: There is no evidence of pericardial effusion.   Mitral Valve: The mitral valve is grossly normal. Mild to moderate mitral  annular calcification. Trivial mitral valve regurgitation. No evidence of  mitral valve stenosis.   Tricuspid Valve: The tricuspid valve is normal in structure. Tricuspid  valve regurgitation is not demonstrated.   Aortic Valve: DI 0.26. The aortic valve was not well visualized. There is  severe calcifcation of the aortic valve. There is severe thickening of the  aortic valve. Aortic valve regurgitation is trivial. Severe aortic  stenosis is present. Aortic valve mean   gradient measures 40.0 mmHg. Aortic valve peak gradient measures 75.7  mmHg. Aortic valve area, by VTI measures 1.09 cm.   Pulmonic Valve: The pulmonic valve was not well visualized. Pulmonic valve  regurgitation is trivial.   Aorta: Aortic dilatation noted. There is borderline dilatation of the  ascending aorta, measuring 39 mm.   Venous: The inferior vena cava is dilated in size with greater than 50%  respiratory variability, suggesting right atrial pressure of 8 mmHg.   IAS/Shunts: The atrial septum is grossly normal.      LEFT VENTRICLE  PLAX 2D  LVIDd:         5.00 cm   Diastology  LVIDs:         3.00 cm   LV e' medial:    5.11 cm/s  LV PW:         1.60 cm   LV E/e' medial:  10.6  LV IVS:        1.40 cm   LV e' lateral:   6.65 cm/s  LVOT diam:     2.10 cm   LV E/e' lateral: 8.2  LV SV:  113  LV SV Index:   46  LVOT Area:     3.46 cm      RIGHT VENTRICLE             IVC  RV S prime:     11.00 cm/s  IVC diam: 2.30 cm   LEFT ATRIUM              Index        RIGHT ATRIUM           Index  LA diam:        4.20 cm 1.72 cm/m   RA Area:     15.20 cm  LA Vol (A2C):   59.5 ml 24.38 ml/m  RA Volume:   38.20 ml  15.65 ml/m  LA Vol (A4C):   62.7 ml 25.69 ml/m  LA Biplane Vol: 61.3 ml 25.12 ml/m   AORTIC VALVE                     PULMONIC VALVE  AV Area (Vmax):    0.92 cm      PV Vmax:       1.05 m/s  AV Area (Vmean):   1.08 cm      PV Peak grad:  4.4 mmHg  AV Area (VTI):     1.09 cm  AV Vmax:           435.00 cm/s  AV Vmean:          270.250 cm/s  AV VTI:            1.030 m  AV Peak Grad:      75.7 mmHg  AV Mean Grad:      40.0 mmHg  LVOT Vmax:         115.00 cm/s  LVOT Vmean:        84.000 cm/s  LVOT VTI:          0.325 m  LVOT/AV VTI ratio: 0.32     AORTA  Ao Root diam: 3.50 cm  Ao Asc diam:  3.90 cm   MITRAL VALVE  MV Area (PHT): 2.71 cm    SHUNTS  MV Decel Time: 280 msec    Systemic VTI:  0.32 m  MV E velocity: 54.30 cm/s  Systemic Diam: 2.10 cm  MV A velocity: 87.40 cm/s  MV E/A ratio:  0.62   Cardiac cath 11/22/21:   Mid LAD-1 lesion is 95% stenosed.   Mid LAD-2 lesion is 70% stenosed.   2nd Diag lesion is 60% stenosed.   A drug-eluting stent was successfully placed using a SYNERGY XD 3.0X48.   Post intervention, there is a 0% residual stenosis.   Post intervention, there is a 0% residual stenosis.   There is mild left ventricular systolic dysfunction.   LV end diastolic pressure is moderately elevated.   The left ventricular ejection fraction is 45-50% by visual estimate.   There is severe aortic valve stenosis.   1.  Left dominant coronary arteries with severe hazy stenosis in the mid LAD which is the likely culprit for non-ST elevation myocardial infarction. 2.  Right heart catheterization showed normal filling pressures, minimal pulmonary hypertension and normal cardiac output. 3.  Severely calcified aortic valve with restricted opening.  There is evidence of severe aortic stenosis with mean  gradient of 56 mmHg and valve area of 0.87 cm. 4.  Successful angioplasty and drug-eluting stent placement to the mid LAD.  A long stent was  used to cover the second lesion in the mid LAD.  The second diagonal was jailed by the stent but had normal flow and no significant ostial stenosis.   Recommendations: Dual antiplatelet therapy for at least 12 months. Aggressive treatment of risk factors. Evaluate for TAVR.   Indications  Nonrheumatic aortic valve stenosis [I35.0 (ICD-10-CM)]  Non-ST elevation (NSTEMI) myocardial infarction ([place]) [I21.4 (ICD-10-CM)]   Clinical Presentation  CHF/Shock Congestive heart failure not present. No shock present.   Procedural Details  Technical Details Procedural Details: The pre-existing IV in the right antecubital vein was exchanged under sterile fashion to a slender sheath. The right wrist was prepped, draped, and anesthetized with 1% lidocaine. Using the modified Seldinger technique, a 5 French sheath was introduced into the right radial artery.3 mg of verapamil was administered through the sheath, weight-based unfractionated heparin was administered intravenously. Right heart catheterization was performed using a 5 French Swan-Ganz catheter. Cardiac output was calculated by the Fick method. A Jackie catheter was used for selective coronary angiography.  A JR4 catheter was needed for right coronary angiography.  It was also used to cross the aortic valve and perform left ventricular angiography with pullback.. Catheter exchanges were performed over an exchange length guidewire. There were no immediate procedural complications.   PCI note: The patient was found to have a high-grade mid LAD stenosis.  I decided to proceed with PCI.  Additional unfractionated heparin was given for anticoagulation.  600 mg of oral clopidogrel was given orally.  See PCI details.  The patient tolerated the procedure well with no immediate complications. A TR band was used for radial  hemostasis at the completion of the procedure.  The patient was transferred to the post catheterization recovery area for further monitoring.   Estimated blood loss <50 mL.   During this procedure medications were administered to achieve and maintain moderate conscious sedation while the patient's heart rate, blood pressure, and oxygen saturation were continuously monitored and I was present face-to-face 100% of this time.   Medications (Filter: Administrations occurring from 1420 to 1624 on 11/22/21)  important  Continuous medications are totaled by the amount administered until 11/22/21 1624.   Heparin (Porcine) in NaCl 1000-0.9 UT/500ML-% SOLN (mL) Total volume:  1,000 mL Date/Time Rate/Dose/Volume Action   11/22/21 1431 500 mL Given   1431 500 mL Given    fentaNYL (SUBLIMAZE) injection (mcg) Total dose:  50 mcg Date/Time Rate/Dose/Volume Action   11/22/21 1436 50 mcg Given    midazolam (VERSED) injection (mg) Total dose:  1 mg Date/Time Rate/Dose/Volume Action   11/22/21 1436 1 mg Given    lidocaine (PF) (XYLOCAINE) 1 % injection (mL) Total volume:  6 mL Date/Time Rate/Dose/Volume Action   11/22/21 1443 3 mL Given   1443 3 mL Given    heparin sodium (porcine) injection (Units) Total dose:  12,000 Units Date/Time Rate/Dose/Volume Action   11/22/21 1455 6,000 Units Given   1510 6,000 Units Given    Radial Cocktail/Verapamil only (mL) Total volume:  10 mL Date/Time Rate/Dose/Volume Action   11/22/21 1453 10 mL Given    clopidogrel (PLAVIX) tablet (mg) Total dose:  600 mg Date/Time Rate/Dose/Volume Action   11/22/21 1512 600 mg Given    nitroGLYCERIN 1 mg/10 mL (100 mcg/mL) - IR/CATH LAB (mcg) Total dose:  200 mcg Date/Time Rate/Dose/Volume Action   11/22/21 1521 200 mcg Given    iohexol (OMNIPAQUE) 350 MG/ML injection (mL) Total volume:  130 mL Date/Time Rate/Dose/Volume Action   11/22/21 1545 130 mL  Given    acetaminophen (TYLENOL) tablet 650 mg  (mg) Total dose:  Cannot be calculated* *Administration dose not documented Date/Time Rate/Dose/Volume Action   11/22/21 1426 *Not included in total MAR Hold    aspirin EC tablet 81 mg (mg) Total dose:  Cannot be calculated* *Administration dose not documented Date/Time Rate/Dose/Volume Action   11/22/21 1426 *Not included in total MAR Hold    atorvastatin (LIPITOR) tablet 80 mg (mg) Total dose:  Cannot be calculated* *Administration dose not documented Date/Time Rate/Dose/Volume Action   11/22/21 1426 *Not included in total MAR Hold    carvedilol (COREG) tablet 12.5 mg (mg) Total dose:  Cannot be calculated* *Administration dose not documented Date/Time Rate/Dose/Volume Action   11/22/21 1426 *Not included in total MAR Hold    glimepiride (AMARYL) tablet 2 mg (mg) Total dose:  Cannot be calculated* *Administration dose not documented Date/Time Rate/Dose/Volume Action   11/22/21 1426 *Not included in total MAR Hold    insulin aspart (novoLOG) injection 0-15 Units (Units) Total dose:  Cannot be calculated* *Administration dose not documented Date/Time Rate/Dose/Volume Action   11/22/21 1426 *Not included in total MAR Hold    nitroGLYCERIN 50 mg in dextrose 5 % 250 mL (0.2 mg/mL) infusion (mcg/min) Total dose:  Cannot be calculated* *Administration dose not documented Date/Time Rate/Dose/Volume Action   11/22/21 1426 *Not included in total MAR Hold    ondansetron (ZOFRAN) injection 4 mg (mg) Total dose:  Cannot be calculated* *Administration dose not documented Date/Time Rate/Dose/Volume Action   11/22/21 1426 *Not included in total MAR Hold    sodium chloride flush (NS) 0.9 % injection 3 mL (mL) Total dose:  Cannot be calculated* *Administration dose not documented Date/Time Rate/Dose/Volume Action   11/22/21 1426 *Not included in total MAR Hold    traZODone (DESYREL) tablet 100 mg (mg) Total dose:  Cannot be calculated* *Administration dose not  documented Date/Time Rate/Dose/Volume Action   11/22/21 1426 *Not included in total MAR Hold    vitamin B-12 (CYANOCOBALAMIN) tablet 1,000 mcg (mcg) Total dose:  Cannot be calculated* *Administration dose not documented Date/Time Rate/Dose/Volume Action   11/22/21 1426 *Not included in total MAR Hold    Sedation Time  Sedation Time Physician-1: 1 hour 3 minutes 25 seconds Contrast  Medication Name Total Dose  iohexol (OMNIPAQUE) 350 MG/ML injection 130 mL   Radiation/Fluoro  Fluoro time: 15.2 (min) DAP: 112276 (mGycm2) Cumulative Air Kerma: 1610 (mGy) Complications  Complications documented before study signed (11/22/2021  9:60 PM)   No complications were associated with this study.  Documented by Elveria Royals, RT - 11/22/2021  3:43 PM     Coronary Findings  Diagnostic Dominance: Left Left Main  Vessel is angiographically normal.    Left Anterior Descending  Mid LAD-1 lesion is 95% stenosed. The lesion is type C and located at the major branch. The lesion is mildly calcified.  Mid LAD-2 lesion is 70% stenosed. The lesion is mildly calcified.    First Diagonal Branch  Vessel is large in size. Vessel is angiographically normal.    Second Diagonal Branch  2nd Diag lesion is 60% stenosed.    Ramus Intermedius  Vessel is small. Vessel is angiographically normal.    Left Circumflex    First Obtuse Marginal Branch  Vessel is moderate in size. Vessel is angiographically normal.    Second Obtuse Marginal Branch  Vessel is angiographically normal.    Left Posterior Descending Artery  Vessel is angiographically normal.    First Left Posterolateral Branch  Vessel  is angiographically normal.    Second Left Posterolateral Branch  Vessel is angiographically normal.    Left Posterior Atrioventricular Artery  Vessel is angiographically normal.    Right Coronary Artery  Vessel is angiographically normal.    Intervention   Mid LAD-1 lesion  Stent (Also treats  lesions: Mid LAD-2)  Lesion length: 40 mm. CATH LAUNCHER 6FR EBU3.5 guide catheter was inserted. Lesion crossed with guidewire using a WIRE RUNTHROUGH .[id number]. Pre-stent angioplasty was performed using a BALLN SAPPHIRE 2.5X15. Maximum pressure: 10 atm. Inflation time: 20 sec. A drug-eluting stent was successfully placed using a SYNERGY XD 3.0X48. Maximum pressure: 14 atm. Inflation time: 20 sec. Post-stent angioplasty was performed using a BALL SAPPHIRE NC24 3.5X15. Maximum pressure: 16 atm. Inflation time: 20 sec. The distal area of the stent was postdilated to 10 atm in the proximal area was postdilated to 16 atm.  Post-Intervention Lesion Assessment  The intervention was successful. Pre-interventional TIMI flow is 3. Post-intervention TIMI flow is 3. No complications occurred at this lesion.  There is a 0% residual stenosis post intervention.    Mid LAD-2 lesion  Stent (Also treats lesions: Mid LAD-1)  See details in Mid LAD-1 lesion.  Post-Intervention Lesion Assessment  The intervention was successful. Pre-interventional TIMI flow is 3. Post-intervention TIMI flow is 3. No complications occurred at this lesion.  There is a 0% residual stenosis post intervention.     Left Heart  Left Ventricle The left ventricular size is normal. There is mild left ventricular systolic dysfunction. LV end diastolic pressure is moderately elevated. The left ventricular ejection fraction is 45-50% by visual estimate. There are LV function abnormalities due to segmental dysfunction.  Aortic Valve There is severe aortic valve stenosis. The aortic valve is calcified. There is restricted aortic valve motion.   Coronary Diagrams  Diagnostic Dominance: Left Intervention  Implants     Permanent Stent  Synergy Xd 3.0x48 - [id number] - Implanted Inventory item: SYNERGY XD 3.0X48 Model/Cat number: [id number]  Manufacturer: Wilkie Aye Lot number: [id number]  Device identifier: [id number] Device  identifier type: GS1  GUDID Information  Request status Successful    Brand name: [place] Rote Version/Model: [id number]  Company name: [employer] MRI safety info as of 11/22/21: MR Conditional  Contains dry or latex rubber: No    GMDN P.T. name: Drug-eluting coronary artery stent, non-bioabsorbable-polymer-coated     As of 11/22/2021  Status: Implanted       Syngo Images   Show images for CARDIAC CATHETERIZATION Images on Long Term Storage   Show images for Kassebaum, Collene Mares to Procedure Log  Procedure Log    Hemo Data  Flowsheet Row Most Recent Value  Fick Cardiac Output 5.93 L/min  Fick Cardiac Output Index 2.43 (L/min)/BSA  Aortic Mean Gradient 56.23 mmHg  Aortic Peak Gradient 57.8 mmHg  Aortic Valve Area 0.87  Aortic Value Area Index 0.36 cm2/BSA  RA A Wave 13 mmHg  RA V Wave 8 mmHg  RA Mean 6 mmHg  RV Systolic Pressure 32 mmHg  RV Diastolic Pressure 4 mmHg  RV EDP 10 mmHg  PA Systolic Pressure 31 mmHg  PA Diastolic Pressure 8 mmHg  PA Mean 16 mmHg  PW A Wave 17 mmHg  PW V Wave 18 mmHg  PW Mean 6 mmHg  AO Systolic Pressure 542 mmHg  AO Diastolic Pressure 62 mmHg  AO Mean 79 mmHg  LV Systolic Pressure 706 mmHg  LV Diastolic Pressure 8 mmHg  LV EDP 18 mmHg  AOp Systolic Pressure 341 mmHg  AOp Diastolic Pressure 67 mmHg  AOp Mean Pressure 90 mmHg  LVp Systolic Pressure 962 mmHg  LVp Diastolic Pressure 13 mmHg  LVp EDP Pressure 25 mmHg  QP/QS 1  TPVR Index 6.59 HRUI  TSVR Index 32.56 HRUI  PVR SVR Ratio 0.14  TPVR/TSVR Ratio     Recent Labs: 02/21/2021: ALT 32 11/23/2021: BUN 12; Creatinine, Ser 1.31; Hemoglobin 11.8; Platelets 86; Potassium 3.8; Sodium 140   Lipid Panel    Component Value Date/Time   CHOL 118 11/22/2021 0412   TRIG 174 (H) 11/22/2021 0412   HDL 42 11/22/2021 0412   CHOLHDL 2.8 11/22/2021 0412   VLDL 35 11/22/2021 0412   LDLCALC 41 11/22/2021 0412     Wt Readings from Last 3 Encounters:  12/15/21  266 lb 3.2 oz (120.7 kg)  12/05/21 264 lb 12.8 oz (120.1 kg)  11/23/21 271 lb 6.2 oz (123.1 kg)     Assessment and Plan:   1. Severe Aortic Valve Stenosis: He has severe aortic valve stenosis but his symptoms have resolved following his PCI. I have personally reviewed the echo images. The aortic valve is thickened, calcified with limited leaflet mobility. I think he would benefit from AVR when he becomes symptomatic. Given advanced age, he is not a good candidate for conventional AVR by surgical approach. I think he may be a good candidate for TAVR.   I have reviewed the natural history of aortic stenosis with the patient and their family members  who are present today. We have discussed the limitations of medical therapy and the poor prognosis associated with symptomatic aortic stenosis. We have reviewed potential treatment options, including palliative medical therapy, conventional surgical aortic valve replacement, and transcatheter aortic valve replacement. We discussed treatment options in the context of the patient's specific comorbid medical conditions.   We will proceed with his pre-TAVR CT scans at pt request but will not plan to move forward with TAVR yet as he is feeling much better. I will plan to see him back in 6 months with an echo at that time. He will call with any change in his symptoms.      Labs/ tests ordered today include:  No orders of the defined types were placed in this encounter.  Disposition:   F/U with me in 6 months.   Signed, Lauree Chandler, MD 12/15/2021 10:15 AM    [facility] [address], [place], [place]  [address] Phone: [phone]; Fax: [phone]

## 2021-12-15 NOTE — Patient Instructions (Addendum)
Medication Instructions:  No changes *If you need a refill on your cardiac medications before your next appointment, please call your pharmacy*   Lab Work: none  Testing/Procedures: CT Scans - as planned   ECHO DUE IN 6 MONTHS: Your physician has requested that you have an echocardiogram. Echocardiography is a painless test that uses sound waves to create images of your heart. It provides your doctor with information about the size and shape of your heart and how well your heart's chambers and valves are working. This procedure takes approximately one hour. There are no restrictions for this procedure.   Follow-Up: IN 6 MONTHS AFTER ECHO WITH DR Angelena Form

## 2021-12-25 ENCOUNTER — Ambulatory Visit (HOSPITAL_COMMUNITY)
Admission: RE | Admit: 2021-12-25 | Discharge: 2021-12-25 | Disposition: A | Discharge status: Home / Self Care | Payer: No Typology Code available for payment source | Source: Ambulatory Visit | Attending: Cardiovascular Disease | Admitting: Cardiovascular Disease

## 2021-12-25 DIAGNOSIS — I350 Nonrheumatic aortic (valve) stenosis: Secondary | ICD-10-CM | POA: Insufficient documentation

## 2021-12-25 MED ORDER — IOHEXOL 350 MG/ML SOLN
100.0000 mL | Freq: Once | INTRAVENOUS | Status: AC | PRN
  Administered 2021-12-25: 100 mL via INTRAVENOUS

## 2021-12-29 ENCOUNTER — Telehealth (HOSPITAL_COMMUNITY): Payer: Self-pay | Admitting: *Deleted

## 2021-12-29 NOTE — Telephone Encounter (Signed)
-----   Message from Kathleene Hazel, MD sent at 12/29/2021 10:51 AM EDT ----- Regarding: RE: Ok to proceed with scheduling Cardiac Rehab OK to start rehab without any restrictions. Thanks, chris  ----- Message ----- From: Chelsea Aus, RN Sent: 12/25/2021   3:52 PM EDT To: Kathleene Hazel, MD Subject: Ok to proceed with scheduling Cardiac Rehab    Dr. 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  I saw the this  pt was referred to the [facility] for evaluation for TAVR.  I have followed along and see that the TAVR procedure is on hold as he is feeling better and will follow up with you in 6 months or sooner should symptoms occur.  Pt had CT scans completed today.  Ok for pt to schedule for Cardiac Rehab?  Based upon your assessment, any restrictions we should observe while in rehab?   Thanks so much for your input!  Alanson Aly, BSN Cardiac and Emergency planning/management officer

## 2022-01-10 ENCOUNTER — Institutional Professional Consult (permissible substitution): Payer: Medicare Other | Admitting: Cardiovascular Disease

## 2022-01-31 ENCOUNTER — Encounter: Payer: No Typology Code available for payment source | Admitting: Surgery

## 2022-02-14 ENCOUNTER — Encounter (HOSPITAL_COMMUNITY): Payer: Self-pay

## 2022-03-09 ENCOUNTER — Other Ambulatory Visit: Payer: No Typology Code available for payment source

## 2022-04-02 ENCOUNTER — Telehealth (HOSPITAL_COMMUNITY): Payer: Self-pay

## 2022-04-02 NOTE — Telephone Encounter (Signed)
No response from pt regarding CR.  Closed referral.  

## 2022-04-03 ENCOUNTER — Other Ambulatory Visit (INDEPENDENT_AMBULATORY_CARE_PROVIDER_SITE_OTHER): Payer: Non-veteran care

## 2022-04-03 ENCOUNTER — Ambulatory Visit (INDEPENDENT_AMBULATORY_CARE_PROVIDER_SITE_OTHER): Payer: Non-veteran care | Admitting: Vascular Surgery

## 2022-05-09 ENCOUNTER — Telehealth: Payer: Self-pay | Admitting: Cardiovascular Disease

## 2022-05-09 NOTE — Telephone Encounter (Signed)
Pt called back regarding Community Care concern.    Pt stated his [place] with the [place] runs out on 06/03/2022.  He was told by the [place] that it will need to be extended 6 Months by [facility].    Pt states he has CT 12/1, 12/5 Appointment with Dr. Angelena Form.    Pt states his [place] Number is: [phone]  [employer], [place] [facility] [phone] This telephone number was called, and per Lenna [facility]: A new referral packet will be emailed to Forde Dandy RN;  The LAST PAGE section ( R ) needs to be filled out by [place];  We will have to fax visit notes, with completed section ( R ) to:  ( [phone] )    Per Ms. Melissa, a New Auth will be given, and Community Care will then be extended for this patient.

## 2022-05-09 NOTE — Telephone Encounter (Signed)
Patient called in to say that [place] office need for our office to send the [place] an extension for the echo. Because its outside of the due date. Please advise

## 2022-05-14 ENCOUNTER — Encounter (INDEPENDENT_AMBULATORY_CARE_PROVIDER_SITE_OTHER): Payer: Self-pay

## 2022-06-14 ENCOUNTER — Other Ambulatory Visit (HOSPITAL_COMMUNITY): Payer: Self-pay | Admitting: Cardiovascular Disease

## 2022-06-14 DIAGNOSIS — I350 Nonrheumatic aortic (valve) stenosis: Secondary | ICD-10-CM

## 2022-06-15 ENCOUNTER — Ambulatory Visit (HOSPITAL_COMMUNITY): Payer: No Typology Code available for payment source | Attending: Cardiology

## 2022-06-15 DIAGNOSIS — I350 Nonrheumatic aortic (valve) stenosis: Secondary | ICD-10-CM | POA: Diagnosis not present

## 2022-06-15 LAB — ECHOCARDIOGRAM COMPLETE
AR max vel: 0.9 cm2
AV Area VTI: 0.94 cm2
AV Area mean vel: 0.88 cm2
AV Mean grad: 37 mmHg
AV Peak grad: 64.3 mmHg
Ao pk vel: 4.01 m/s
Area-P 1/2: 2.66 cm2
S' Lateral: 4.3 cm

## 2022-06-19 ENCOUNTER — Ambulatory Visit: Payer: No Typology Code available for payment source | Admitting: Cardiovascular Disease

## 2022-06-20 ENCOUNTER — Encounter: Payer: Self-pay | Admitting: *Deleted

## 2022-06-20 ENCOUNTER — Ambulatory Visit
Payer: No Typology Code available for payment source | Attending: Cardiovascular Disease | Admitting: Cardiovascular Disease

## 2022-06-20 ENCOUNTER — Encounter: Payer: Self-pay | Admitting: Cardiovascular Disease

## 2022-06-20 VITALS — BP 122/80 | HR 67 | Ht 73.0 in | Wt 272.0 lb

## 2022-06-20 DIAGNOSIS — I350 Nonrheumatic aortic (valve) stenosis: Secondary | ICD-10-CM

## 2022-06-20 NOTE — Progress Notes (Signed)
Structural Heart Clinic Note  Chief Complaint  Patient presents with   Follow-up    Aortic stenosis    History of Present Illness: 81 yo male with history of diabetes mellitus, former tobacco abuse, HTN, AAA, sleep apnea, CAD and severe aortic stenosis who is here today for cardiac follow up. He was admitted to [facility] 11/21/21 with dyspnea and left arm pain and was found to have a NSTEMI. Cardiac cath with severe mid LAD stenosis treated with a drug eluting stent. Mean AV gradient 56 mmHg with AVA 0.87 cm2. Echo 11/22/21 with LVEF=60-65%. Normal RV function. Severe aortic stenosis with mean gradient of 40 mmHg, peak gradient 75.7 mmHg, AVA 0.92cm2. He was discharged on ASA and Plavix. I saw him in the office in June 2023 to discuss his aortic stenosis and he was feeling much better following his PCI. We elected to follow his aortic stenosis. Echo 06/15/22 with LVEF=60-65%, mild LVH. Severe aortic stenosis with mean gradient 37 mmHg, AVA 0.94 cm2, SVI 34, DI 0.23.   He is here today for follow up. The patient denies any chest pain, dyspnea, palpitations, lower extremity edema, orthopnea, PND, dizziness, near syncope or syncope.  He is mainly limited by back pain.   He lives in [place], [place] alone. He is retired from Psychologist, educational. He has upper dentures and no active issues with his bottom teeth. He sees the dentist every six months.    Primary Care Physician: Anson Oregon, MD Primary Cardiologist: Angelena Form  Past Medical History:  Diagnosis Date   Arthritis    oa   Back pain    at times   Diabetes mellitus without complication ([place])    diet controlled   Hard of hearing    Heart murmur    Hypertension    Measles 1950's   Mumps 1950's   Nocturia    Shortness of breath    on exertion   Sleep apnea    no CPAP    Past Surgical History:  Procedure Laterality Date   colonscopy     CORONARY STENT INTERVENTION N/A 11/22/2021   Procedure: CORONARY STENT INTERVENTION;  Surgeon: Wellington Hampshire, MD;  Location: [facility];  Service: Cardiovascular;  Laterality: N/A;   left partial knee replacement  2001   RIGHT/LEFT HEART CATH AND CORONARY ANGIOGRAPHY N/A 11/22/2021   Procedure: RIGHT/LEFT HEART CATH AND CORONARY ANGIOGRAPHY;  Surgeon: Wellington Hampshire, MD;  Location: [facility];  Service: Cardiovascular;  Laterality: N/A;   TOTAL KNEE ARTHROPLASTY Right 05/12/2014   Procedure: RIGHT TOTAL KNEE ARTHROPLASTY;  Surgeon: Gearlean Alf, MD;  Location: [facility];  Service: Orthopedics;  Laterality: Right;    Current Outpatient Medications  Medication Sig Dispense Refill   acetaminophen (TYLENOL) 325 MG tablet Take 2 tablets (650 mg total) by mouth every 6 (six) hours as needed for mild pain (or Fever >/= 101). 40 tablet 0   aspirin EC 81 MG tablet Take 81 mg by mouth daily. Swallow whole.     atorvastatin (LIPITOR) 80 MG tablet Take 1 tablet (80 mg total) by mouth daily. 30 tablet 2   carvedilol (COREG) 12.5 MG tablet Take 6.25 mg by mouth 2 (two) times daily with a meal.     Cholecalciferol 50 MCG (2000 UT) TABS Take 1 tablet by mouth daily.     clopidogrel (PLAVIX) 75 MG tablet Take 1 tablet (75 mg total) by mouth daily with breakfast. 30 tablet 3   diclofenac Sodium (VOLTAREN) 1 %  GEL APPLY 2 GRAMS TO AFFECTED AREA THREE TIMES A DAY     glimepiride (AMARYL) 2 MG tablet TAKE ONE TABLET BY MOUTH EVERY MORNING FOR DIABETES     latanoprost (XALATAN) 0.005 % ophthalmic solution Place 1 drop into both eyes at bedtime.     lidocaine (LIDODERM) 5 % APPLY 1 PATCH TO SKIN AS DIRECTED BY YOUR MEDICAL PROVIDER AS NEEDED (APPLY FOR 12 HOURS, THEN REMOVE FOR 12 HOURS) PLACE 12 HOURS ON AND 12 HOURS OFF     lisinopril-hydrochlorothiazide (ZESTORETIC) 10-12.5 MG tablet Take 1 tablet by mouth daily. 90 tablet 3   metFORMIN (GLUCOPHAGE-XR) 500 MG 24 hr tablet TAKE TWO TABLETS BY MOUTH BEFORE BREAKFAST AND TAKE ONE TABLET AT BEDTIME FOR DIABETES (ANNUAL KIDNEY FUNCTION TESTING IS NEEDED)      methocarbamol (ROBAXIN) 750 MG tablet TAKE ONE TABLET BY MOUTH THREE TIMES A DAY AS NEEDED *MAY CAUSE SLEEPINESS; DO NOT DRIVE AFTER TAKING IT 30 tablet 0   nitroGLYCERIN (NITROSTAT) 0.4 MG SL tablet Place 1 tablet (0.4 mg total) under the tongue every 5 (five) minutes as needed for chest pain. 25 tablet 1   traZODone (DESYREL) 100 MG tablet Take 1 tablet by mouth at bedtime.     vitamin B-12 (CYANOCOBALAMIN) 500 MCG tablet Take 2 tablets by mouth daily.     No current facility-administered medications for this visit.    No Known Allergies  Social History   Socioeconomic History   Marital status: Widowed    Spouse name: Not on file   Number of children: 2   Years of education: Not on file   Highest education level: Not on file  Occupational History   Occupation: Retired Librarian, academic  Tobacco Use   Smoking status: Former    Packs/day: 0.50    Years: 40.00    Total pack years: 20.00    Types: Cigarettes    Quit date: 07/15/2020    Years since quitting: 1.9   Smokeless tobacco: Never  Substance and Sexual Activity   Alcohol use: No   Drug use: No   Sexual activity: Not on file  Other Topics Concern   Not on file  Social History Narrative   Not on file   Social Determinants of Health   Financial Resource Strain: Not on file  Food Insecurity: Not on file  Transportation Needs: Not on file  Physical Activity: Not on file  Stress: Not on file  Social Connections: Not on file  Intimate Partner Violence: Not on file    Family History  Problem Relation Age of Onset   Breast cancer Sister        both sisters   Lung cancer Brother        both brothers    Review of Systems:  As stated in the HPI and otherwise negative.   BP 122/80   Pulse 67   Ht _0  (1.854 m)   Wt 272 lb (123.4 kg)   SpO2 98%   BMI 35.89 kg/m   Physical Examination:  General: Well developed, well nourished, NAD  HEENT: OP clear, mucus membranes moist  SKIN: warm, dry. No  rashes. Neuro: No focal deficits  Musculoskeletal: Muscle strength 5/5 all ext  Psychiatric: Mood and affect normal  Neck: No JVD, no carotid bruits, no thyromegaly, no lymphadenopathy.  Lungs:Clear bilaterally, no wheezes, rhonci, crackles Cardiovascular: Regular rate and rhythm. Harsh systolic murmur.  Abdomen:Soft. Bowel sounds present. Non-tender.  Extremities: No lower extremity edema. Pulses are 2 +  in the bilateral DP/PT.  EKG:  EKG is not ordered today. The ekg ordered today demonstrates   Echo December 2023:   1. The aortic valve is calcified. There is mild calcification of the  aortic valve. Aortic valve regurgitation is not visualized. Severe aortic  valve stenosis. Aortic valve area, by VTI measures 0.94 cm. Aortic valve  mean gradient measures 37.0 mmHg.  Aortic valve Vmax measures 4.01 m/s.   2. Left ventricular ejection fraction, by estimation, is 60 to 65%. Left  ventricular ejection fraction by 3D volume is 59 %. The left ventricle has  normal function. The left ventricle has no regional wall motion  abnormalities. There is mild left  ventricular hypertrophy. Left ventricular diastolic parameters are  consistent with Grade I diastolic dysfunction (impaired relaxation).   3. Right ventricular systolic function is normal. The right ventricular  size is normal.   4. The mitral valve is normal in structure. No evidence of mitral valve  regurgitation. No evidence of mitral stenosis.   5. Aortic dilatation noted. There is moderate dilatation of the ascending  aorta, measuring 45 mm.   6. The inferior vena cava is normal in size with greater than 50%  respiratory variability, suggesting right atrial pressure of 3 mmHg.   Comparison(s): No significant change from prior study. Prior images  reviewed side by side. Prior AV Vmax 4.3 m/s.   FINDINGS   Left Ventricle: Left ventricular ejection fraction, by estimation, is 60  to 65%. Left ventricular ejection fraction by  3D volume is 59 %. The left  ventricle has normal function. The left ventricle has no regional wall  motion abnormalities. The left  ventricular internal cavity size was normal in size. There is mild left  ventricular hypertrophy. Left ventricular diastolic parameters are  consistent with Grade I diastolic dysfunction (impaired relaxation).   Right Ventricle: The right ventricular size is normal. No increase in  right ventricular wall thickness. Right ventricular systolic function is  normal.   Left Atrium: Left atrial size was normal in size.   Right Atrium: Right atrial size was normal in size.   Pericardium: There is no evidence of pericardial effusion.   Mitral Valve: The mitral valve is normal in structure. No evidence of  mitral valve regurgitation. No evidence of mitral valve stenosis.   Tricuspid Valve: The tricuspid valve is normal in structure. Tricuspid  valve regurgitation is not demonstrated. No evidence of tricuspid  stenosis.   Aortic Valve: The aortic valve is calcified. There is mild calcification  of the aortic valve. Aortic valve regurgitation is not visualized. Severe  aortic stenosis is present. Aortic valve mean gradient measures 37.0 mmHg.  Aortic valve peak gradient  measures 64.3 mmHg. Aortic valve area, by VTI measures 0.94 cm.   Pulmonic Valve: The pulmonic valve was normal in structure. Pulmonic valve  regurgitation is not visualized. No evidence of pulmonic stenosis.   Aorta: Aortic dilatation noted. There is moderate dilatation of the  ascending aorta, measuring 45 mm.   Venous: The inferior vena cava is normal in size with greater than 50%  respiratory variability, suggesting right atrial pressure of 3 mmHg.   IAS/Shunts: No atrial level shunt detected by color flow Doppler.     LEFT VENTRICLE  PLAX 2D  LVIDd:         5.80 cm         Diastology  LVIDs:         4.30 cm  LV e' medial:    4.35 cm/s  LV PW:         1.10 cm         LV  E/e' medial:  14.9  LV IVS:        1.30 cm         LV e' lateral:   3.53 cm/s  LVOT diam:     2.30 cm         LV E/e' lateral: 18.4  LV SV:         83  LV SV Index:   34  LVOT Area:     4.15 cm        3D Volume EF                                 LV 3D EF:    Left                                              ventricul                                              ar                                              ejection                                              fraction                                              by 3D                                              volume is                                              59 %.                                   3D Volume EF:                                 3D EF:        59 %                                 LV EDV:  160 ml                                 LV ESV:       65 ml                                 LV SV:        95 ml   RIGHT VENTRICLE  RV Basal diam:  3.70 cm  RV Mid diam:    3.50 cm  RV S prime:     10.40 cm/s  TAPSE (M-mode): 2.1 cm   LEFT ATRIUM             Index        RIGHT ATRIUM           Index  LA diam:        4.00 cm 1.65 cm/m   RA Area:     20.20 cm  LA Vol (A2C):   49.5 ml 20.37 ml/m  RA Volume:   51.10 ml  21.03 ml/m  LA Vol (A4C):   57.8 ml 23.79 ml/m  LA Biplane Vol: 54.3 ml 22.35 ml/m   AORTIC VALVE  AV Area (Vmax):    0.90 cm  AV Area (Vmean):   0.88 cm  AV Area (VTI):     0.94 cm  AV Vmax:           401.00 cm/s  AV Vmean:          284.000 cm/s  AV VTI:            0.879 m  AV Peak Grad:      64.3 mmHg  AV Mean Grad:      37.0 mmHg  LVOT Vmax:         86.70 cm/s  LVOT Vmean:        60.400 cm/s  LVOT VTI:          0.199 m  LVOT/AV VTI ratio: 0.23    AORTA  Ao Root diam: 3.40 cm  Ao Asc diam:  4.50 cm   MITRAL VALVE  MV Area (PHT): 2.66 cm     SHUNTS  MV Decel Time: 285 msec     Systemic VTI:  0.20 m  MV E velocity: 64.90 cm/s   Systemic Diam: 2.30 cm  MV A velocity: 109.00 cm/s   MV E/A ratio:  0.60   Cardiac cath 11/22/21:   Mid LAD-1 lesion is 95% stenosed.   Mid LAD-2 lesion is 70% stenosed.   2nd Diag lesion is 60% stenosed.   A drug-eluting stent was successfully placed using a SYNERGY XD 3.0X48.   Post intervention, there is a 0% residual stenosis.   Post intervention, there is a 0% residual stenosis.   There is mild left ventricular systolic dysfunction.   LV end diastolic pressure is moderately elevated.   The left ventricular ejection fraction is 45-50% by visual estimate.   There is severe aortic valve stenosis.   1.  Left dominant coronary arteries with severe hazy stenosis in the mid LAD which is the likely culprit for non-ST elevation myocardial infarction. 2.  Right heart catheterization showed normal filling pressures, minimal pulmonary hypertension and normal cardiac output. 3.  Severely calcified aortic valve with restricted opening.  There is evidence of severe aortic stenosis with mean gradient of 56 mmHg  and valve area of 0.87 cm. 4.  Successful angioplasty and drug-eluting stent placement to the mid LAD.  A long stent was used to cover the second lesion in the mid LAD.  The second diagonal was jailed by the stent but had normal flow and no significant ostial stenosis.   Recommendations: Dual antiplatelet therapy for at least 12 months. Aggressive treatment of risk factors. Evaluate for TAVR.   Indications  Nonrheumatic aortic valve stenosis [I35.0 (ICD-10-CM)]  Non-ST elevation (NSTEMI) myocardial infarction ([place]) [I21.4 (ICD-10-CM)]   Clinical Presentation  CHF/Shock Congestive heart failure not present. No shock present.   Procedural Details  Technical Details Procedural Details: The pre-existing IV in the right antecubital vein was exchanged under sterile fashion to a slender sheath. The right wrist was prepped, draped, and anesthetized with 1% lidocaine. Using the modified Seldinger technique, a 5 French sheath was introduced into  the right radial artery.3 mg of verapamil was administered through the sheath, weight-based unfractionated heparin was administered intravenously. Right heart catheterization was performed using a 5 French Swan-Ganz catheter. Cardiac output was calculated by the Fick method. A Jackie catheter was used for selective coronary angiography.  A JR4 catheter was needed for right coronary angiography.  It was also used to cross the aortic valve and perform left ventricular angiography with pullback.. Catheter exchanges were performed over an exchange length guidewire. There were no immediate procedural complications.   PCI note: The patient was found to have a high-grade mid LAD stenosis.  I decided to proceed with PCI.  Additional unfractionated heparin was given for anticoagulation.  600 mg of oral clopidogrel was given orally.  See PCI details.  The patient tolerated the procedure well with no immediate complications. A TR band was used for radial hemostasis at the completion of the procedure.  The patient was transferred to the post catheterization recovery area for further monitoring.   Estimated blood loss <50 mL.   During this procedure medications were administered to achieve and maintain moderate conscious sedation while the patient's heart rate, blood pressure, and oxygen saturation were continuously monitored and I was present face-to-face 100% of this time.   Medications (Filter: Administrations occurring from 1420 to 1624 on 11/22/21)  important  Continuous medications are totaled by the amount administered until 11/22/21 1624.   Heparin (Porcine) in NaCl 1000-0.9 UT/500ML-% SOLN (mL) Total volume:  1,000 mL Date/Time Rate/Dose/Volume Action   11/22/21 1431 500 mL Given   1431 500 mL Given    fentaNYL (SUBLIMAZE) injection (mcg) Total dose:  50 mcg Date/Time Rate/Dose/Volume Action   11/22/21 1436 50 mcg Given    midazolam (VERSED) injection (mg) Total dose:  1 mg Date/Time  Rate/Dose/Volume Action   11/22/21 1436 1 mg Given    lidocaine (PF) (XYLOCAINE) 1 % injection (mL) Total volume:  6 mL Date/Time Rate/Dose/Volume Action   11/22/21 1443 3 mL Given   1443 3 mL Given    heparin sodium (porcine) injection (Units) Total dose:  12,000 Units Date/Time Rate/Dose/Volume Action   11/22/21 1455 6,000 Units Given   1510 6,000 Units Given    Radial Cocktail/Verapamil only (mL) Total volume:  10 mL Date/Time Rate/Dose/Volume Action   11/22/21 1453 10 mL Given    clopidogrel (PLAVIX) tablet (mg) Total dose:  600 mg Date/Time Rate/Dose/Volume Action   11/22/21 1512 600 mg Given    nitroGLYCERIN 1 mg/10 mL (100 mcg/mL) - IR/CATH LAB (mcg) Total dose:  200 mcg Date/Time Rate/Dose/Volume Action   11/22/21 1521 200 mcg  Given    iohexol (OMNIPAQUE) 350 MG/ML injection (mL) Total volume:  130 mL Date/Time Rate/Dose/Volume Action   11/22/21 1545 130 mL Given    acetaminophen (TYLENOL) tablet 650 mg (mg) Total dose:  Cannot be calculated* *Administration dose not documented Date/Time Rate/Dose/Volume Action   11/22/21 1426 *Not included in total MAR Hold    aspirin EC tablet 81 mg (mg) Total dose:  Cannot be calculated* *Administration dose not documented Date/Time Rate/Dose/Volume Action   11/22/21 1426 *Not included in total MAR Hold    atorvastatin (LIPITOR) tablet 80 mg (mg) Total dose:  Cannot be calculated* *Administration dose not documented Date/Time Rate/Dose/Volume Action   11/22/21 1426 *Not included in total MAR Hold    carvedilol (COREG) tablet 12.5 mg (mg) Total dose:  Cannot be calculated* *Administration dose not documented Date/Time Rate/Dose/Volume Action   11/22/21 1426 *Not included in total MAR Hold    glimepiride (AMARYL) tablet 2 mg (mg) Total dose:  Cannot be calculated* *Administration dose not documented Date/Time Rate/Dose/Volume Action   11/22/21 1426 *Not included in total MAR Hold    insulin aspart (novoLOG)  injection 0-15 Units (Units) Total dose:  Cannot be calculated* *Administration dose not documented Date/Time Rate/Dose/Volume Action   11/22/21 1426 *Not included in total MAR Hold    nitroGLYCERIN 50 mg in dextrose 5 % 250 mL (0.2 mg/mL) infusion (mcg/min) Total dose:  Cannot be calculated* *Administration dose not documented Date/Time Rate/Dose/Volume Action   11/22/21 1426 *Not included in total MAR Hold    ondansetron (ZOFRAN) injection 4 mg (mg) Total dose:  Cannot be calculated* *Administration dose not documented Date/Time Rate/Dose/Volume Action   11/22/21 1426 *Not included in total MAR Hold    sodium chloride flush (NS) 0.9 % injection 3 mL (mL) Total dose:  Cannot be calculated* *Administration dose not documented Date/Time Rate/Dose/Volume Action   11/22/21 1426 *Not included in total MAR Hold    traZODone (DESYREL) tablet 100 mg (mg) Total dose:  Cannot be calculated* *Administration dose not documented Date/Time Rate/Dose/Volume Action   11/22/21 1426 *Not included in total MAR Hold    vitamin B-12 (CYANOCOBALAMIN) tablet 1,000 mcg (mcg) Total dose:  Cannot be calculated* *Administration dose not documented Date/Time Rate/Dose/Volume Action   11/22/21 1426 *Not included in total MAR Hold    Sedation Time  Sedation Time Physician-1: 1 hour 3 minutes 25 seconds Contrast  Medication Name Total Dose  iohexol (OMNIPAQUE) 350 MG/ML injection 130 mL   Radiation/Fluoro  Fluoro time: 15.2 (min) DAP: 112276 (mGycm2) Cumulative Air Kerma: 1694 (mGy) Complications  Complications documented before study signed (11/22/2021  5:03 PM)   No complications were associated with this study.  Documented by Elveria Royals, RT - 11/22/2021  3:43 PM     Coronary Findings  Diagnostic Dominance: Left Left Main  Vessel is angiographically normal.    Left Anterior Descending  Mid LAD-1 lesion is 95% stenosed. The lesion is type C and located at the major branch. The  lesion is mildly calcified.  Mid LAD-2 lesion is 70% stenosed. The lesion is mildly calcified.    First Diagonal Branch  Vessel is large in size. Vessel is angiographically normal.    Second Diagonal Branch  2nd Diag lesion is 60% stenosed.    Ramus Intermedius  Vessel is small. Vessel is angiographically normal.    Left Circumflex    First Obtuse Marginal Branch  Vessel is moderate in size. Vessel is angiographically normal.    Second Obtuse Marginal Branch  Vessel  is angiographically normal.    Left Posterior Descending Artery  Vessel is angiographically normal.    First Left Posterolateral Branch  Vessel is angiographically normal.    Second Left Posterolateral Branch  Vessel is angiographically normal.    Left Posterior Atrioventricular Artery  Vessel is angiographically normal.    Right Coronary Artery  Vessel is angiographically normal.    Intervention   Mid LAD-1 lesion  Stent (Also treats lesions: Mid LAD-2)  Lesion length: 40 mm. CATH LAUNCHER 6FR EBU3.5 guide catheter was inserted. Lesion crossed with guidewire using a WIRE RUNTHROUGH .[id number]. Pre-stent angioplasty was performed using a BALLN SAPPHIRE 2.5X15. Maximum pressure: 10 atm. Inflation time: 20 sec. A drug-eluting stent was successfully placed using a SYNERGY XD 3.0X48. Maximum pressure: 14 atm. Inflation time: 20 sec. Post-stent angioplasty was performed using a BALL SAPPHIRE NC24 3.5X15. Maximum pressure: 16 atm. Inflation time: 20 sec. The distal area of the stent was postdilated to 10 atm in the proximal area was postdilated to 16 atm.  Post-Intervention Lesion Assessment  The intervention was successful. Pre-interventional TIMI flow is 3. Post-intervention TIMI flow is 3. No complications occurred at this lesion.  There is a 0% residual stenosis post intervention.    Mid LAD-2 lesion  Stent (Also treats lesions: Mid LAD-1)  See details in Mid LAD-1 lesion.  Post-Intervention Lesion Assessment   The intervention was successful. Pre-interventional TIMI flow is 3. Post-intervention TIMI flow is 3. No complications occurred at this lesion.  There is a 0% residual stenosis post intervention.     Left Heart  Left Ventricle The left ventricular size is normal. There is mild left ventricular systolic dysfunction. LV end diastolic pressure is moderately elevated. The left ventricular ejection fraction is 45-50% by visual estimate. There are LV function abnormalities due to segmental dysfunction.  Aortic Valve There is severe aortic valve stenosis. The aortic valve is calcified. There is restricted aortic valve motion.   Coronary Diagrams  Diagnostic Dominance: Left Intervention  Implants     Permanent Stent  Synergy Xd 3.0x48 - [id number] - Implanted Inventory item: SYNERGY XD 3.0X48 Model/Cat number: [id number]  Manufacturer: Wilkie Aye Lot number: [id number]  Device identifier: [id number] Device identifier type: GS1  GUDID Information  Request status Successful    Brand name: [place] Rote Version/Model: [id number]  Company name: [employer] MRI safety info as of 11/22/21: MR Conditional  Contains dry or latex rubber: No    GMDN P.T. name: Drug-eluting coronary artery stent, non-bioabsorbable-polymer-coated     As of 11/22/2021  Status: Implanted       Syngo Images   Show images for CARDIAC CATHETERIZATION Images on Long Term Storage   Show images for Gilberg, Collene Mares to Procedure Log  Procedure Log    Hemo Data  Flowsheet Row Most Recent Value  Fick Cardiac Output 5.93 L/min  Fick Cardiac Output Index 2.43 (L/min)/BSA  Aortic Mean Gradient 56.23 mmHg  Aortic Peak Gradient 57.8 mmHg  Aortic Valve Area 0.87  Aortic Value Area Index 0.36 cm2/BSA  RA A Wave 13 mmHg  RA V Wave 8 mmHg  RA Mean 6 mmHg  RV Systolic Pressure 32 mmHg  RV Diastolic Pressure 4 mmHg  RV EDP 10 mmHg  PA Systolic Pressure 31 mmHg  PA Diastolic  Pressure 8 mmHg  PA Mean 16 mmHg  PW A Wave 17 mmHg  PW V Wave 18 mmHg  PW Mean 6 mmHg  AO Systolic Pressure 443 mmHg  AO Diastolic Pressure 62  mmHg  AO Mean 79 mmHg  LV Systolic Pressure 252 mmHg  LV Diastolic Pressure 8 mmHg  LV EDP 18 mmHg  AOp Systolic Pressure 712 mmHg  AOp Diastolic Pressure 67 mmHg  AOp Mean Pressure 90 mmHg  LVp Systolic Pressure 929 mmHg  LVp Diastolic Pressure 13 mmHg  LVp EDP Pressure 25 mmHg  QP/QS 1  TPVR Index 6.59 HRUI  TSVR Index 32.56 HRUI  PVR SVR Ratio 0.14  TPVR/TSVR Ratio     Recent Labs: 11/23/2021: BUN 12; Creatinine, Ser 1.31; Hemoglobin 11.8; Platelets 86; Potassium 3.8; Sodium 140   Lipid Panel    Component Value Date/Time   CHOL 118 11/22/2021 0412   TRIG 174 (H) 11/22/2021 0412   HDL 42 11/22/2021 0412   CHOLHDL 2.8 11/22/2021 0412   VLDL 35 11/22/2021 0412   LDLCALC 41 11/22/2021 0412     Wt Readings from Last 3 Encounters:  06/20/22 272 lb (123.4 kg)  12/15/21 266 lb 3.2 oz (120.7 kg)  12/05/21 264 lb 12.8 oz (120.1 kg)     Assessment and Plan:   1. Severe Aortic Valve Stenosis: He has severe aortic valve stenosis. He remains asymptomatic. I have personally reviewed the echo images. The aortic valve is thickened, calcified with limited leaflet mobility. I think he would benefit from AVR when he becomes symptomatic. Given advanced age, he is not a good candidate for conventional AVR by surgical approach. I think he may be a good candidate for TAVR.   I have reviewed the natural history of aortic stenosis with the patient and their family members  who are present today. We have discussed the limitations of medical therapy and the poor prognosis associated with symptomatic aortic stenosis. We have reviewed potential treatment options, including palliative medical therapy, conventional surgical aortic valve replacement, and transcatheter aortic valve replacement. We discussed treatment options in the context of the patient's  specific comorbid medical conditions.   I will plan to see him back in 6 months. We will repeat his echo in 6 months. He will call with any change in his symptoms.      Labs/ tests ordered today include:   Orders Placed This Encounter  Procedures   ECHOCARDIOGRAM COMPLETE   Disposition:   F/U with me in 6 months.   Signed, Lauree Chandler, MD 06/20/2022 2:03 PM    [address], [place], [place]  [address] Phone: [phone]; Fax: [phone]

## 2022-06-20 NOTE — Patient Instructions (Signed)
Medication Instructions:  No changes today *If you need a refill on your cardiac medications before your next appointment, please call your pharmacy*   Lab Work: none If you have labs (blood work) drawn today and your tests are completely normal, you will receive your results only by: MyChart Message (if you have MyChart) OR A paper copy in the mail If you have any lab test that is abnormal or we need to change your treatment, we will call you to review the results.   Testing/Procedures: ECHO IN JUNE 2024, BEFORE NEXT VISIT WITH DR. Clifton James Your physician has requested that you have an echocardiogram. Echocardiography is a painless test that uses sound waves to create images of your heart. It provides your doctor with information about the size and shape of your heart and how well your heart's chambers and valves are working. This procedure takes approximately one hour. There are no restrictions for this procedure. Please do NOT wear cologne, perfume, aftershave, or lotions (deodorant is allowed). Please arrive 15 minutes prior to your appointment time.   Follow-Up: At [facility], you and your health needs are our priority.  As part of our continuing mission to provide you with exceptional heart care, we have created designated Provider Care Teams.  These Care Teams include your primary Cardiologist (physician) and Advanced Practice Providers (APPs -  Physician Assistants and Nurse Practitioners) who all work together to provide you with the care you need, when you need it.   Your next appointment:   6 month(s)  The format for your next appointment:   In Person  Provider:   Verne Carrow, MD      Important Information About Sugar

## 2022-06-21 ENCOUNTER — Telehealth: Payer: Self-pay | Admitting: *Deleted

## 2022-06-21 DIAGNOSIS — R918 Other nonspecific abnormal finding of lung field: Secondary | ICD-10-CM

## 2022-06-21 DIAGNOSIS — I77819 Aortic ectasia, unspecified site: Secondary | ICD-10-CM

## 2022-06-21 NOTE — Telephone Encounter (Signed)
-----   Message from Kathleene Hazel, MD sent at 06/20/2022  4:43 PM EST ----- Hey. I did put that on the echo and meant to do it but overlooked it today.  Nieko Clarin, Can we talk to him and set up a chest CTA now? 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  I didn't see any mention about ordering CTA in your note, did you change your mind?  Thanks, Lauren  ----- Message ----- From: Kathleene Hazel, MD Sent: 06/20/2022   2:03 PM EST To: Iona Coach, RN

## 2022-06-21 NOTE — Telephone Encounter (Signed)
Spoke with the patient and informed of plan for CT aorta for aortic dilatation as well follow up of pulmonary nodules.  He is aware will be contacted to schedule and will need metabolic panel checked for kidney function prior.

## 2022-07-04 ENCOUNTER — Ambulatory Visit: Payer: No Typology Code available for payment source | Attending: Cardiovascular Disease

## 2022-07-04 DIAGNOSIS — R918 Other nonspecific abnormal finding of lung field: Secondary | ICD-10-CM

## 2022-07-04 DIAGNOSIS — I77819 Aortic ectasia, unspecified site: Secondary | ICD-10-CM

## 2022-07-04 LAB — BASIC METABOLIC PANEL
BUN/Creatinine Ratio: 11 (ref 10–24)
BUN: 13 mg/dL (ref 8–27)
CO2: 23 mmol/L (ref 20–29)
Calcium: 9.6 mg/dL (ref 8.6–10.2)
Chloride: 104 mmol/L (ref 96–106)
Creatinine, Ser: 1.2 mg/dL (ref 0.76–1.27)
Glucose: 165 mg/dL — ABNORMAL HIGH (ref 70–99)
Potassium: 4 mmol/L (ref 3.5–5.2)
Sodium: 143 mmol/L (ref 134–144)
eGFR: 62 mL/min/{1.73_m2} (ref >59)

## 2022-07-19 ENCOUNTER — Ambulatory Visit (HOSPITAL_COMMUNITY)
Admission: RE | Admit: 2022-07-19 | Discharge: 2022-07-19 | Disposition: A | Discharge status: Home / Self Care | Payer: No Typology Code available for payment source | Source: Ambulatory Visit | Attending: Cardiovascular Disease | Admitting: Cardiovascular Disease

## 2022-07-19 DIAGNOSIS — I77819 Aortic ectasia, unspecified site: Secondary | ICD-10-CM | POA: Diagnosis not present

## 2022-07-19 DIAGNOSIS — R918 Other nonspecific abnormal finding of lung field: Secondary | ICD-10-CM | POA: Diagnosis present

## 2022-07-19 MED ORDER — IOHEXOL 350 MG/ML SOLN
75.0000 mL | Freq: Once | INTRAVENOUS | Status: AC | PRN
  Administered 2022-07-19: 75 mL via INTRAVENOUS

## 2022-08-28 ENCOUNTER — Other Ambulatory Visit (HOSPITAL_COMMUNITY): Payer: Self-pay | Admitting: Family Medicine

## 2022-08-28 DIAGNOSIS — M545 Low back pain, unspecified: Secondary | ICD-10-CM

## 2022-09-05 ENCOUNTER — Ambulatory Visit (HOSPITAL_COMMUNITY)
Admission: RE | Admit: 2022-09-05 | Discharge: 2022-09-05 | Disposition: A | Discharge status: Home / Self Care | Payer: No Typology Code available for payment source | Source: Ambulatory Visit | Attending: Family Medicine | Admitting: Family Medicine

## 2022-09-05 DIAGNOSIS — M545 Low back pain, unspecified: Secondary | ICD-10-CM | POA: Diagnosis not present

## 2022-11-16 ENCOUNTER — Encounter: Payer: Self-pay | Admitting: Cardiovascular Disease

## 2022-11-16 ENCOUNTER — Telehealth: Payer: Self-pay | Admitting: Cardiovascular Disease

## 2022-11-16 ENCOUNTER — Ambulatory Visit (HOSPITAL_COMMUNITY): Payer: No Typology Code available for payment source | Attending: Internal Medicine

## 2022-11-16 DIAGNOSIS — I350 Nonrheumatic aortic (valve) stenosis: Secondary | ICD-10-CM

## 2022-11-16 LAB — ECHOCARDIOGRAM COMPLETE
AR max vel: 0.94 cm2
AV Area VTI: 0.99 cm2
AV Area mean vel: 0.95 cm2
AV Mean grad: 49 mmHg
AV Peak grad: 86.7 mmHg
Ao pk vel: 4.66 m/s
Area-P 1/2: 1.7 cm2
S' Lateral: 2.1 cm

## 2022-11-22 ENCOUNTER — Telehealth: Payer: Self-pay | Admitting: Cardiovascular Disease

## 2022-11-22 NOTE — Telephone Encounter (Signed)
Patient stated his VA authorization runs out on 6/3.  Patient stated he will need a request sent to extend his VA authorization.  Patient wants call back to follow-up on this request.

## 2022-12-05 ENCOUNTER — Encounter: Payer: Self-pay | Admitting: Cardiovascular Disease

## 2022-12-05 NOTE — Telephone Encounter (Signed)
Called and spoke w patient.  His BP has been lower than normal over the past several days.  Normal systolic is around 106/  HRs have been in 90s and BPs 90s-80s/50s past several days.  No change in eating/drinking habits, drinking 4 bottles of water daily.  No swelling.    He is getting progressively more short of breath and tired when walking.    I asked him to hold the lisinopril/hctz, unless morning BP reading is >105 systolic.   He was scheduled 01/10/23 with Dr. Clifton James for severe AS.    I moved his appointment up to 12/14/22.

## 2022-12-13 NOTE — Progress Notes (Signed)
Structural Heart Clinic Note  Chief Complaint  Patient presents with   Follow-up    CAD, Aortic stenosis   History of Present Illness: 82 yo male with history of diabetes mellitus, former tobacco abuse, HTN, AAA, sleep apnea, CAD and severe aortic stenosis who is here today for cardiac follow up. He was admitted to [facility] 11/21/21 with dyspnea and left arm pain and was found to have a NSTEMI. Cardiac cath with severe mid LAD stenosis treated with a drug eluting stent. Echo 11/22/21 with LVEF=60-65%. Normal RV function. Severe aortic stenosis with mean gradient of 40 mmHg, peak gradient 75.7 mmHg, AVA 0.92cm2. He was discharged on ASA and Plavix. Cardiac CT June 2023 with severe AS, valve area 505 mm2 suitable for a 26 mm Edwards Sapien 3 valve. CT demonstrates suitable anatomy for femoral access for TAVR. I saw him in the office in June 2023 to discuss his aortic stenosis and he was feeling much better following his PCI. We elected to follow his aortic stenosis. Echo 06/15/22 with LVEF=60-65%, mild LVH. Severe aortic stenosis with mean gradient 37 mmHg, AVA 0.94 cm2, SVI 34, DI 0.23. I saw him in December 2023 and he continued to do well. Most recent echo 11/16/22 with LVEF=60-65%. Severe aortic stenosis with mean gradient 49 mmHg, 86.7 mmHg, AVA 0.94 cm2, SVI 42, DI 0.22. He has a AAA, 4.3 cm in 2023. Known thoracic aortic aneurysm, 4.0 cm CTA January 2024.   He is here today for follow up. The patient denies any chest pain, dyspnea, palpitations, lower extremity edema, orthopnea, PND, dizziness, near syncope or syncope. He is mainly limited by back pain and neuropathy in his feet. He stopped his Lisinopril/HCTZ due to soft BP.   He lives in [place], [place] alone. He is retired from Set [employer]. He has upper dentures and no active issues with his bottom teeth. He sees the dentist every six months.    Primary Care Physician: Howell Pringle, MD Primary Cardiologist: Clifton James  Past Medical History:  Diagnosis  Date   Arthritis    oa   Back pain    at times   Diabetes mellitus without complication (HCC)    diet controlled   Hard of hearing    Heart murmur    Hypertension    Measles 1950's   Mumps 1950's   Nocturia    Shortness of breath    on exertion   Sleep apnea    no CPAP    Past Surgical History:  Procedure Laterality Date   colonscopy     CORONARY STENT INTERVENTION N/A 11/22/2021   Procedure: CORONARY STENT INTERVENTION;  Surgeon: Iran Ouch, MD;  Location: [facility] INVASIVE CV LAB;  Service: Cardiovascular;  Laterality: N/A;   left partial knee replacement  2001   RIGHT/LEFT HEART CATH AND CORONARY ANGIOGRAPHY N/A 11/22/2021   Procedure: RIGHT/LEFT HEART CATH AND CORONARY ANGIOGRAPHY;  Surgeon: Iran Ouch, MD;  Location: [facility] INVASIVE CV LAB;  Service: Cardiovascular;  Laterality: N/A;   TOTAL KNEE ARTHROPLASTY Right 05/12/2014   Procedure: RIGHT TOTAL KNEE ARTHROPLASTY;  Surgeon: Loanne Drilling, MD;  Location: [facility] ORS;  Service: Orthopedics;  Laterality: Right;    Current Outpatient Medications  Medication Sig Dispense Refill   acetaminophen (TYLENOL) 325 MG tablet Take 2 tablets (650 mg total) by mouth every 6 (six) hours as needed for mild pain (or Fever >/= 101). 40 tablet 0   aspirin EC 81 MG tablet Take 81 mg by mouth daily. Swallow whole.  atorvastatin (LIPITOR) 80 MG tablet Take 1 tablet (80 mg total) by mouth daily. 30 tablet 2   carvedilol (COREG) 12.5 MG tablet Take 6.25 mg by mouth 2 (two) times daily with a meal.     Cholecalciferol 50 MCG (2000 UT) TABS Take 1 tablet by mouth daily.     clopidogrel (PLAVIX) 75 MG tablet Take 1 tablet (75 mg total) by mouth daily with breakfast. 30 tablet 3   diclofenac Sodium (VOLTAREN) 1 % GEL APPLY 2 GRAMS TO AFFECTED AREA THREE TIMES A DAY     glimepiride (AMARYL) 2 MG tablet TAKE ONE TABLET BY MOUTH EVERY MORNING FOR DIABETES     latanoprost (XALATAN) 0.005 % ophthalmic solution Place 1 drop into both eyes at  bedtime.     lidocaine (LIDODERM) 5 % APPLY 1 PATCH TO SKIN AS DIRECTED BY YOUR MEDICAL PROVIDER AS NEEDED (APPLY FOR 12 HOURS, THEN REMOVE FOR 12 HOURS) PLACE 12 HOURS ON AND 12 HOURS OFF     lisinopril-hydrochlorothiazide (ZESTORETIC) 10-12.5 MG tablet Take 1 tablet by mouth daily. 90 tablet 3   metFORMIN (GLUCOPHAGE-XR) 500 MG 24 hr tablet TAKE TWO TABLETS BY MOUTH BEFORE BREAKFAST AND TAKE ONE TABLET AT BEDTIME FOR DIABETES (ANNUAL KIDNEY FUNCTION TESTING IS NEEDED)     methocarbamol (ROBAXIN) 750 MG tablet TAKE ONE TABLET BY MOUTH THREE TIMES A DAY AS NEEDED *MAY CAUSE SLEEPINESS; DO NOT DRIVE AFTER TAKING IT 30 tablet 0   traZODone (DESYREL) 100 MG tablet Take 1 tablet by mouth at bedtime.     vitamin B-12 (CYANOCOBALAMIN) 500 MCG tablet Take 2 tablets by mouth daily.     nitroGLYCERIN (NITROSTAT) 0.4 MG SL tablet Place 1 tablet (0.4 mg total) under the tongue every 5 (five) minutes as needed for chest pain. 25 tablet 1   No current facility-administered medications for this visit.    No Known Allergies  Social History   Socioeconomic History   Marital status: Widowed    Spouse name: Not on file   Number of children: 2   Years of education: Not on file   Highest education level: Not on file  Occupational History   Occupation: Retired Research officer, trade union  Tobacco Use   Smoking status: Former    Packs/day: 0.50    Years: 40.00    Additional pack years: 0.00    Total pack years: 20.00    Types: Cigarettes    Quit date: 07/15/2020    Years since quitting: 2.4   Smokeless tobacco: Never  Substance and Sexual Activity   Alcohol use: No   Drug use: No   Sexual activity: Not on file  Other Topics Concern   Not on file  Social History Narrative   Not on file   Social Determinants of Health   Financial Resource Strain: Not on file  Food Insecurity: Not on file  Transportation Needs: Not on file  Physical Activity: Not on file  Stress: Not on file  Social Connections:  Not on file  Intimate Partner Violence: Not on file    Family History  Problem Relation Age of Onset   Breast cancer Sister        both sisters   Lung cancer Brother        both brothers    Review of Systems:  As stated in the HPI and otherwise negative.   BP 112/66   Pulse 71   Ht 6\' 1"  (1.854 m)   Wt 118.3 kg   SpO2 96%  BMI 34.41 kg/m   Physical Examination:  General: Well developed, well nourished, NAD  HEENT: OP clear, mucus membranes moist  SKIN: warm, dry. No rashes. Neuro: No focal deficits  Musculoskeletal: Muscle strength 5/5 all ext  Psychiatric: Mood and affect normal  Neck: No JVD, no carotid bruits, no thyromegaly, no lymphadenopathy.  Lungs:Clear bilaterally, no wheezes, rhonci, crackles Cardiovascular: Regular rate and rhythm. Harsh systolic murmur.  Abdomen:Soft. Bowel sounds present. Non-tender.  Extremities: No lower extremity edema. Pulses are 2 + in the bilateral DP/PT.  EKG:  EKG is ordered today. The ekg ordered today demonstrates NSR  Echo May 2024:  1. Left ventricular ejection fraction, by estimation, is 60 to 65%. The  left ventricle has normal function. The left ventricle has no regional  wall motion abnormalities. There is mild concentric left ventricular  hypertrophy. Left ventricular diastolic  parameters are consistent with Grade I diastolic dysfunction (impaired  relaxation).   2. Right ventricular systolic function is normal. The right ventricular  size is normal. Tricuspid regurgitation signal is inadequate for assessing  PA pressure.   3. The mitral valve is normal in structure. Trivial mitral valve  regurgitation. No evidence of mitral stenosis. Moderate mitral annular  calcification.   4. The aortic valve was not well visualized. There is severe calcifcation  of the aortic valve. Aortic valve regurgitation is mild. Severe aortic  valve stenosis. Aortic valve mean gradient measures 49.0 mmHg. AVA 0.87  cm^2.   5. Aortic  dilatation noted. There is mild dilatation of the ascending  aorta, measuring 41 mm.   6. The inferior vena cava is normal in size with greater than 50%  respiratory variability, suggesting right atrial pressure of 3 mmHg.   FINDINGS   Left Ventricle: Left ventricular ejection fraction, by estimation, is 60  to 65%. The left ventricle has normal function. The left ventricle has no  regional wall motion abnormalities. The left ventricular internal cavity  size was normal in size. There is   mild concentric left ventricular hypertrophy. Left ventricular diastolic  parameters are consistent with Grade I diastolic dysfunction (impaired  relaxation).   Right Ventricle: The right ventricular size is normal. No increase in  right ventricular wall thickness. Right ventricular systolic function is  normal. Tricuspid regurgitation signal is inadequate for assessing PA  pressure.   Left Atrium: Left atrial size was normal in size.   Right Atrium: Right atrial size was normal in size.   Pericardium: There is no evidence of pericardial effusion.   Mitral Valve: The mitral valve is normal in structure. Moderate mitral  annular calcification. Trivial mitral valve regurgitation. No evidence of  mitral valve stenosis.   Tricuspid Valve: The tricuspid valve is normal in structure. Tricuspid  valve regurgitation is not demonstrated.   Aortic Valve: The aortic valve was not well visualized. There is severe  calcifcation of the aortic valve. Aortic valve regurgitation is mild.  Severe aortic stenosis is present. Aortic valve mean gradient measures  49.0 mmHg. Aortic valve peak gradient  measures 86.7 mmHg. Aortic valve area, by VTI measures 0.99 cm.   Pulmonic Valve: The pulmonic valve was normal in structure. Pulmonic valve  regurgitation is not visualized.   Aorta: Aortic dilatation noted. There is mild dilatation of the ascending  aorta, measuring 41 mm.   Venous: The inferior vena cava is  normal in size with greater than 50%  respiratory variability, suggesting right atrial pressure of 3 mmHg.   IAS/Shunts: No atrial level shunt detected  by color flow Doppler.     LEFT VENTRICLE  PLAX 2D  LVIDd:         4.60 cm   Diastology  LVIDs:         2.10 cm   LV e' medial:    5.66 cm/s  LV PW:         1.10 cm   LV E/e' medial:  9.6  LV IVS:        1.10 cm   LV e' lateral:   5.44 cm/s  LVOT diam:     2.40 cm   LV E/e' lateral: 10.0  LV SV:         103  LV SV Index:   42  LVOT Area:     4.52 cm     RIGHT VENTRICLE  RV Basal diam:  3.00 cm   LEFT ATRIUM             Index        RIGHT ATRIUM           Index  LA diam:        4.40 cm 1.79 cm/m   RA Pressure: 3.00 mmHg  LA Vol (A2C):   56.8 ml 23.16 ml/m  RA Area:     18.50 cm  LA Vol (A4C):   63.8 ml 26.02 ml/m  RA Volume:   53.90 ml  21.98 ml/m  LA Biplane Vol: 60.2 ml 24.55 ml/m   AORTIC VALVE  AV Area (Vmax):    0.94 cm  AV Area (Vmean):   0.95 cm  AV Area (VTI):     0.99 cm  AV Vmax:           465.50 cm/s  AV Vmean:          313.500 cm/s  AV VTI:            1.037 m  AV Peak Grad:      86.7 mmHg  AV Mean Grad:      49.0 mmHg  LVOT Vmax:         96.95 cm/s  LVOT Vmean:        65.800 cm/s  LVOT VTI:          0.227 m  LVOT/AV VTI ratio: 0.22    AORTA  Ao Root diam: 3.40 cm  Ao Asc diam:  4.10 cm    Cardiac cath 11/22/21:   Mid LAD-1 lesion is 95% stenosed.   Mid LAD-2 lesion is 70% stenosed.   2nd Diag lesion is 60% stenosed.   A drug-eluting stent was successfully placed using a SYNERGY XD 3.0X48.   Post intervention, there is a 0% residual stenosis.   Post intervention, there is a 0% residual stenosis.   There is mild left ventricular systolic dysfunction.   LV end diastolic pressure is moderately elevated.   The left ventricular ejection fraction is 45-50% by visual estimate.   There is severe aortic valve stenosis.   1.  Left dominant coronary arteries with severe hazy stenosis in the mid LAD  which is the likely culprit for non-ST elevation myocardial infarction. 2.  Right heart catheterization showed normal filling pressures, minimal pulmonary hypertension and normal cardiac output. 3.  Severely calcified aortic valve with restricted opening.  There is evidence of severe aortic stenosis with mean gradient of 56 mmHg and valve area of 0.87 cm. 4.  Successful angioplasty and drug-eluting stent placement to the mid LAD.  A long stent was  used to cover the second lesion in the mid LAD.  The second diagonal was jailed by the stent but had normal flow and no significant ostial stenosis.   Recommendations: Dual antiplatelet therapy for at least 12 months. Aggressive treatment of risk factors. Evaluate for TAVR.   Indications  Nonrheumatic aortic valve stenosis [I35.0 (ICD-10-CM)]  Non-ST elevation (NSTEMI) myocardial infarction (HCC) [I21.4 (ICD-10-CM)]   Clinical Presentation  CHF/Shock Congestive heart failure not present. No shock present.   Procedural Details  Technical Details Procedural Details: The pre-existing IV in the right antecubital vein was exchanged under sterile fashion to a slender sheath. The right wrist was prepped, draped, and anesthetized with 1% lidocaine. Using the modified Seldinger technique, a 5 French sheath was introduced into the right radial artery.3 mg of verapamil was administered through the sheath, weight-based unfractionated heparin was administered intravenously. Right heart catheterization was performed using a 5 French Swan-Ganz catheter. Cardiac output was calculated by the Fick method. A Jackie catheter was used for selective coronary angiography.  A JR4 catheter was needed for right coronary angiography.  It was also used to cross the aortic valve and perform left ventricular angiography with pullback.. Catheter exchanges were performed over an exchange length guidewire. There were no immediate procedural complications.   PCI note: The patient  was found to have a high-grade mid LAD stenosis.  I decided to proceed with PCI.  Additional unfractionated heparin was given for anticoagulation.  600 mg of oral clopidogrel was given orally.  See PCI details.  The patient tolerated the procedure well with no immediate complications. A TR band was used for radial hemostasis at the completion of the procedure.  The patient was transferred to the post catheterization recovery area for further monitoring.   Estimated blood loss <50 mL.   During this procedure medications were administered to achieve and maintain moderate conscious sedation while the patient's heart rate, blood pressure, and oxygen saturation were continuously monitored and I was present face-to-face 100% of this time.   Medications (Filter: Administrations occurring from 1420 to 1624 on 11/22/21)  important  Continuous medications are totaled by the amount administered until 11/22/21 1624.   Heparin (Porcine) in NaCl 1000-0.9 UT/500ML-% SOLN (mL) Total volume:  1,000 mL Date/Time Rate/Dose/Volume Action   11/22/21 1431 500 mL Given   1431 500 mL Given    fentaNYL (SUBLIMAZE) injection (mcg) Total dose:  50 mcg Date/Time Rate/Dose/Volume Action   11/22/21 1436 50 mcg Given    midazolam (VERSED) injection (mg) Total dose:  1 mg Date/Time Rate/Dose/Volume Action   11/22/21 1436 1 mg Given    lidocaine (PF) (XYLOCAINE) 1 % injection (mL) Total volume:  6 mL Date/Time Rate/Dose/Volume Action   11/22/21 1443 3 mL Given   1443 3 mL Given    heparin sodium (porcine) injection (Units) Total dose:  12,000 Units Date/Time Rate/Dose/Volume Action   11/22/21 1455 6,000 Units Given   1510 6,000 Units Given    Radial Cocktail/Verapamil only (mL) Total volume:  10 mL Date/Time Rate/Dose/Volume Action   11/22/21 1453 10 mL Given    clopidogrel (PLAVIX) tablet (mg) Total dose:  600 mg Date/Time Rate/Dose/Volume Action   11/22/21 1512 600 mg Given    nitroGLYCERIN 1 mg/10  mL (100 mcg/mL) - IR/CATH LAB (mcg) Total dose:  200 mcg Date/Time Rate/Dose/Volume Action   11/22/21 1521 200 mcg Given    iohexol (OMNIPAQUE) 350 MG/ML injection (mL) Total volume:  130 mL Date/Time Rate/Dose/Volume Action   11/22/21 1545 130  mL Given    acetaminophen (TYLENOL) tablet 650 mg (mg) Total dose:  Cannot be calculated* *Administration dose not documented Date/Time Rate/Dose/Volume Action   11/22/21 1426 *Not included in total MAR Hold    aspirin EC tablet 81 mg (mg) Total dose:  Cannot be calculated* *Administration dose not documented Date/Time Rate/Dose/Volume Action   11/22/21 1426 *Not included in total MAR Hold    atorvastatin (LIPITOR) tablet 80 mg (mg) Total dose:  Cannot be calculated* *Administration dose not documented Date/Time Rate/Dose/Volume Action   11/22/21 1426 *Not included in total MAR Hold    carvedilol (COREG) tablet 12.5 mg (mg) Total dose:  Cannot be calculated* *Administration dose not documented Date/Time Rate/Dose/Volume Action   11/22/21 1426 *Not included in total MAR Hold    glimepiride (AMARYL) tablet 2 mg (mg) Total dose:  Cannot be calculated* *Administration dose not documented Date/Time Rate/Dose/Volume Action   11/22/21 1426 *Not included in total MAR Hold    insulin aspart (novoLOG) injection 0-15 Units (Units) Total dose:  Cannot be calculated* *Administration dose not documented Date/Time Rate/Dose/Volume Action   11/22/21 1426 *Not included in total MAR Hold    nitroGLYCERIN 50 mg in dextrose 5 % 250 mL (0.2 mg/mL) infusion (mcg/min) Total dose:  Cannot be calculated* *Administration dose not documented Date/Time Rate/Dose/Volume Action   11/22/21 1426 *Not included in total MAR Hold    ondansetron (ZOFRAN) injection 4 mg (mg) Total dose:  Cannot be calculated* *Administration dose not documented Date/Time Rate/Dose/Volume Action   11/22/21 1426 *Not included in total MAR Hold    sodium chloride flush (NS)  0.9 % injection 3 mL (mL) Total dose:  Cannot be calculated* *Administration dose not documented Date/Time Rate/Dose/Volume Action   11/22/21 1426 *Not included in total MAR Hold    traZODone (DESYREL) tablet 100 mg (mg) Total dose:  Cannot be calculated* *Administration dose not documented Date/Time Rate/Dose/Volume Action   11/22/21 1426 *Not included in total MAR Hold    vitamin B-12 (CYANOCOBALAMIN) tablet 1,000 mcg (mcg) Total dose:  Cannot be calculated* *Administration dose not documented Date/Time Rate/Dose/Volume Action   11/22/21 1426 *Not included in total MAR Hold    Sedation Time  Sedation Time Physician-1: 1 hour 3 minutes 25 seconds Contrast  Medication Name Total Dose  iohexol (OMNIPAQUE) 350 MG/ML injection 130 mL   Radiation/Fluoro  Fluoro time: 15.2 (min) DAP: 112276 (mGycm2) Cumulative Air Kerma: 2246 (mGy) Complications  Complications documented before study signed (11/22/2021  4:39 PM)   No complications were associated with this study.  Documented by Velora Heckler, RT - 11/22/2021  3:43 PM     Coronary Findings  Diagnostic Dominance: Left Left Main  Vessel is angiographically normal.    Left Anterior Descending  Mid LAD-1 lesion is 95% stenosed. The lesion is type C and located at the major branch. The lesion is mildly calcified.  Mid LAD-2 lesion is 70% stenosed. The lesion is mildly calcified.    First Diagonal Branch  Vessel is large in size. Vessel is angiographically normal.    Second Diagonal Branch  2nd Diag lesion is 60% stenosed.    Ramus Intermedius  Vessel is small. Vessel is angiographically normal.    Left Circumflex    First Obtuse Marginal Branch  Vessel is moderate in size. Vessel is angiographically normal.    Second Obtuse Marginal Branch  Vessel is angiographically normal.    Left Posterior Descending Artery  Vessel is angiographically normal.    First Left Posterolateral Branch  Vessel  is angiographically  normal.    Second Left Posterolateral Branch  Vessel is angiographically normal.    Left Posterior Atrioventricular Artery  Vessel is angiographically normal.    Right Coronary Artery  Vessel is angiographically normal.    Intervention   Mid LAD-1 lesion  Stent (Also treats lesions: Mid LAD-2)  Lesion length: 40 mm. CATH LAUNCHER 6FR EBU3.5 guide catheter was inserted. Lesion crossed with guidewire using a WIRE RUNTHROUGH .[id number]. Pre-stent angioplasty was performed using a BALLN SAPPHIRE 2.5X15. Maximum pressure: 10 atm. Inflation time: 20 sec. A drug-eluting stent was successfully placed using a SYNERGY XD 3.0X48. Maximum pressure: 14 atm. Inflation time: 20 sec. Post-stent angioplasty was performed using a BALL SAPPHIRE NC24 3.5X15. Maximum pressure: 16 atm. Inflation time: 20 sec. The distal area of the stent was postdilated to 10 atm in the proximal area was postdilated to 16 atm.  Post-Intervention Lesion Assessment  The intervention was successful. Pre-interventional TIMI flow is 3. Post-intervention TIMI flow is 3. No complications occurred at this lesion.  There is a 0% residual stenosis post intervention.    Mid LAD-2 lesion  Stent (Also treats lesions: Mid LAD-1)  See details in Mid LAD-1 lesion.  Post-Intervention Lesion Assessment  The intervention was successful. Pre-interventional TIMI flow is 3. Post-intervention TIMI flow is 3. No complications occurred at this lesion.  There is a 0% residual stenosis post intervention.     Left Heart  Left Ventricle The left ventricular size is normal. There is mild left ventricular systolic dysfunction. LV end diastolic pressure is moderately elevated. The left ventricular ejection fraction is 45-50% by visual estimate. There are LV function abnormalities due to segmental dysfunction.  Aortic Valve There is severe aortic valve stenosis. The aortic valve is calcified. There is restricted aortic valve motion.   Coronary  Diagrams  Diagnostic Dominance: Left Intervention  Implants     Permanent Stent  Synergy Xd 3.0x48 - [id number] - Implanted Inventory item: SYNERGY XD 3.0X48 Model/Cat number: [id number]  Manufacturer: Norvel Richards Lot number: [id number]  Device identifier: [id number] Device identifier type: GS1  GUDID Information  Request status Successful    Brand name: Lacie Scotts Version/Model: [id number]  Company name: [employer] MRI safety info as of 11/22/21: MR Conditional  Contains dry or latex rubber: No    GMDN P.T. name: Drug-eluting coronary artery stent, non-bioabsorbable-polymer-coated     As of 11/22/2021  Status: Implanted       Syngo Images   Show images for CARDIAC CATHETERIZATION Images on Long Term Storage   Show images for Johannsen, Daisy Lazar to Procedure Log  Procedure Log    Hemo Data  Flowsheet Row Most Recent Value  Fick Cardiac Output 5.93 L/min  Fick Cardiac Output Index 2.43 (L/min)/BSA  Aortic Mean Gradient 56.23 mmHg  Aortic Peak Gradient 57.8 mmHg  Aortic Valve Area 0.87  Aortic Value Area Index 0.36 cm2/BSA  RA A Wave 13 mmHg  RA V Wave 8 mmHg  RA Mean 6 mmHg  RV Systolic Pressure 32 mmHg  RV Diastolic Pressure 4 mmHg  RV EDP 10 mmHg  PA Systolic Pressure 31 mmHg  PA Diastolic Pressure 8 mmHg  PA Mean 16 mmHg  PW A Wave 17 mmHg  PW V Wave 18 mmHg  PW Mean 6 mmHg  AO Systolic Pressure 114 mmHg  AO Diastolic Pressure 62 mmHg  AO Mean 79 mmHg  LV Systolic Pressure 183 mmHg  LV Diastolic Pressure 8 mmHg  LV EDP 18 mmHg  AOp Systolic Pressure 131 mmHg  AOp Diastolic Pressure 67 mmHg  AOp Mean Pressure 90 mmHg  LVp Systolic Pressure 182 mmHg  LVp Diastolic Pressure 13 mmHg  LVp EDP Pressure 25 mmHg  QP/QS 1  TPVR Index 6.59 HRUI  TSVR Index 32.56 HRUI  PVR SVR Ratio 0.14  TPVR/TSVR Ratio     Recent Labs: 07/04/2022: BUN 13; Creatinine, Ser 1.20; Potassium 4.0; Sodium 143   Lipid Panel     Component Value Date/Time   CHOL 118 11/22/2021 0412   TRIG 174 (H) 11/22/2021 0412   HDL 42 11/22/2021 0412   CHOLHDL 2.8 11/22/2021 0412   VLDL 35 11/22/2021 0412   LDLCALC 41 11/22/2021 0412     Wt Readings from Last 3 Encounters:  12/14/22 118.3 kg  06/20/22 123.4 kg  12/15/21 120.7 kg    Assessment and Plan:   1. Severe Aortic Valve Stenosis: He has severe aortic stenosis. He has had his pre TAVR CT scans. He will be sized to a 26 mm Edwards Sapien 3 valve and appears to have femoral access. He remains asymptomatic. I have personally reviewed the echo images. The aortic valve is thickened, calcified with limited leaflet mobility. I think he would benefit from AVR when he becomes symptomatic. Given advanced age, he is not a good candidate for conventional AVR by surgical approach. I think he may be a good candidate for TAVR.   I have reviewed the natural history of aortic stenosis with the patient and their family members  who are present today. We have discussed the limitations of medical therapy and the poor prognosis associated with symptomatic aortic stenosis. We have reviewed potential treatment options, including palliative medical therapy, conventional surgical aortic valve replacement, and transcatheter aortic valve replacement. We discussed treatment options in the context of the patient's specific comorbid medical conditions.   Repeat echo in 6 months. He will call with change in his symptoms.   2. CAD without angina: No chest pain suggestive of angina. Continue ASA, Plavix, beta blocker and statin.   3. 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 Will follow with yearly scans.   4. AAA:4.3 cm in 2023 by CTA.  Will arrange abd u/s now.   5. HTN: BP is now controlled. He has stopped his Lisinopril/HCTZ due to soft BP  6. 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      Labs/ tests ordered today include:    Orders Placed This Encounter  Procedures   Lipid panel   Hepatic function panel   EKG 12-Lead   ECHOCARDIOGRAM COMPLETE   VAS [place] AAA DUPLEX   Disposition:   F/U with me in 6 months.   Signed, Verne Carrow, MD 12/14/2022 9:55 AM    [facility] [address] [place], [place], [place]  [address] Phone: [phone]; Fax: [phone]

## 2022-12-14 ENCOUNTER — Ambulatory Visit
Payer: No Typology Code available for payment source | Attending: Cardiovascular Disease | Admitting: Cardiovascular Disease

## 2022-12-14 ENCOUNTER — Encounter: Payer: Self-pay | Admitting: Cardiovascular Disease

## 2022-12-14 VITALS — BP 112/66 | HR 71 | Ht 73.0 in | Wt 260.8 lb

## 2022-12-14 DIAGNOSIS — I7121 Aneurysm of the ascending aorta, without rupture: Secondary | ICD-10-CM

## 2022-12-14 DIAGNOSIS — I2510 Atherosclerotic heart disease of native coronary artery without angina pectoris: Secondary | ICD-10-CM | POA: Diagnosis not present

## 2022-12-14 DIAGNOSIS — E785 Hyperlipidemia, unspecified: Secondary | ICD-10-CM

## 2022-12-14 DIAGNOSIS — I350 Nonrheumatic aortic (valve) stenosis: Secondary | ICD-10-CM

## 2022-12-14 DIAGNOSIS — I714 Abdominal aortic aneurysm, without rupture, unspecified: Secondary | ICD-10-CM

## 2022-12-14 DIAGNOSIS — I10 Essential (primary) hypertension: Secondary | ICD-10-CM

## 2022-12-14 NOTE — Patient Instructions (Signed)
Medication Instructions:  Your physician recommends that you continue on your current medications as directed. Please refer to the Current Medication list given to you today.  *If you need a refill on your cardiac medications before your next appointment, please call your pharmacy*  Lab Work: Lipids, LFT's today If you have labs (blood work) drawn today and your tests are completely normal, you will receive your results only by: MyChart Message (if you have MyChart) OR A paper copy in the mail If you have any lab test that is abnormal or we need to change your treatment, we will call you to review the results.  Testing/Procedures: AAA ultrasound Your physician has requested that you have an abdominal aorta duplex. During this test, an ultrasound is used to evaluate the aorta. Allow 30 minutes for this exam. Do not eat after midnight the day before and avoid carbonated beverages  ECHO (in 6 months) Your physician has requested that you have an echocardiogram. Echocardiography is a painless test that uses sound waves to create images of your heart. It provides your doctor with information about the size and shape of your heart and how well your heart's chambers and valves are working. This procedure takes approximately one hour. There are no restrictions for this procedure. Please do NOT wear cologne, perfume, aftershave, or lotions (deodorant is allowed). Please arrive 15 minutes prior to your appointment time.  Follow-Up: At [facility], you and your health needs are our priority.  As part of our continuing mission to provide you with exceptional heart care, we have created designated Provider Care Teams.  These Care Teams include your primary Cardiologist (physician) and Advanced Practice Providers (APPs -  Physician Assistants and Nurse Practitioners) who all work together to provide you with the care you need, when you need it.  Your next appointment:   6 month(s)  Provider:    Verne Carrow, MD

## 2022-12-15 LAB — LIPID PANEL
Chol/HDL Ratio: 1 ratio (ref 0.0–5.0)
Cholesterol, Total: 98 mg/dL — ABNORMAL LOW (ref 100–199)
HDL: 99 mg/dL (ref >39)
Triglycerides: 106 mg/dL (ref 0–149)

## 2022-12-15 LAB — HEPATIC FUNCTION PANEL
ALT: 68 IU/L — ABNORMAL HIGH (ref 0–44)
AST: 43 IU/L — ABNORMAL HIGH (ref 0–40)
Albumin: 4.3 g/dL (ref 3.8–4.8)
Alkaline Phosphatase: 80 IU/L (ref 44–121)
Bilirubin Total: 1.2 mg/dL (ref 0.0–1.2)
Bilirubin, Direct: 0.36 mg/dL (ref 0.00–0.40)
Total Protein: 6.4 g/dL (ref 6.0–8.5)

## 2022-12-17 ENCOUNTER — Telehealth: Payer: Self-pay | Admitting: *Deleted

## 2022-12-17 DIAGNOSIS — E785 Hyperlipidemia, unspecified: Secondary | ICD-10-CM

## 2022-12-17 NOTE — Telephone Encounter (Signed)
I spoke with patient and let him know LDL would need to be redrawn.  He will come in for lab work on Tuesday or Wednesday. Patient reports his BP is running 116-123/68-73.  He has not been taking lisinopril/HCTZ and is asking if he should stay off this.  Per last office note on 5/31-  HTN: BP is now controlled. He has stopped his Lisinopril/HCTZ due to soft BP.   I told patient to stay off lisinopril/HCTZ and to let us know if BP consistently running greater than 120/80

## 2022-12-18 ENCOUNTER — Encounter: Payer: Self-pay | Admitting: Cardiovascular Disease

## 2022-12-18 ENCOUNTER — Ambulatory Visit: Payer: No Typology Code available for payment source | Attending: Cardiovascular Disease

## 2022-12-18 DIAGNOSIS — E785 Hyperlipidemia, unspecified: Secondary | ICD-10-CM

## 2022-12-18 LAB — LDL CHOLESTEROL, DIRECT: LDL Direct: 31 mg/dL (ref 0–99)

## 2022-12-31 ENCOUNTER — Other Ambulatory Visit (HOSPITAL_COMMUNITY): Payer: No Typology Code available for payment source

## 2023-01-01 ENCOUNTER — Ambulatory Visit (HOSPITAL_COMMUNITY)
Admission: RE | Admit: 2023-01-01 | Discharge: 2023-01-01 | Disposition: A | Discharge status: Home / Self Care | Payer: No Typology Code available for payment source | Source: Ambulatory Visit | Attending: Cardiovascular Disease | Admitting: Cardiovascular Disease

## 2023-01-01 DIAGNOSIS — I2510 Atherosclerotic heart disease of native coronary artery without angina pectoris: Secondary | ICD-10-CM | POA: Diagnosis not present

## 2023-01-01 DIAGNOSIS — I10 Essential (primary) hypertension: Secondary | ICD-10-CM | POA: Insufficient documentation

## 2023-01-01 DIAGNOSIS — I350 Nonrheumatic aortic (valve) stenosis: Secondary | ICD-10-CM | POA: Insufficient documentation

## 2023-01-01 DIAGNOSIS — I714 Abdominal aortic aneurysm, without rupture, unspecified: Secondary | ICD-10-CM | POA: Insufficient documentation

## 2023-01-01 DIAGNOSIS — I7121 Aneurysm of the ascending aorta, without rupture: Secondary | ICD-10-CM | POA: Diagnosis not present

## 2023-01-01 DIAGNOSIS — E785 Hyperlipidemia, unspecified: Secondary | ICD-10-CM | POA: Diagnosis present

## 2023-01-04 ENCOUNTER — Other Ambulatory Visit (HOSPITAL_COMMUNITY): Payer: No Typology Code available for payment source

## 2023-01-04 ENCOUNTER — Other Ambulatory Visit: Payer: Self-pay | Admitting: *Deleted

## 2023-01-04 DIAGNOSIS — I714 Abdominal aortic aneurysm, without rupture, unspecified: Secondary | ICD-10-CM

## 2023-01-04 NOTE — Progress Notes (Signed)
Repeat AAA ultrasound in 6 months.

## 2023-01-10 ENCOUNTER — Ambulatory Visit: Payer: No Typology Code available for payment source | Admitting: Cardiovascular Disease

## 2023-01-27 ENCOUNTER — Encounter: Payer: Self-pay | Admitting: Cardiovascular Disease

## 2023-05-01 ENCOUNTER — Encounter: Payer: Self-pay | Admitting: Cardiology

## 2023-05-01 ENCOUNTER — Ambulatory Visit (HOSPITAL_COMMUNITY): Payer: No Typology Code available for payment source | Attending: Internal Medicine

## 2023-05-01 DIAGNOSIS — I350 Nonrheumatic aortic (valve) stenosis: Secondary | ICD-10-CM | POA: Insufficient documentation

## 2023-05-01 DIAGNOSIS — I7121 Aneurysm of the ascending aorta, without rupture: Secondary | ICD-10-CM | POA: Diagnosis present

## 2023-05-01 DIAGNOSIS — E785 Hyperlipidemia, unspecified: Secondary | ICD-10-CM | POA: Diagnosis present

## 2023-05-01 DIAGNOSIS — I2510 Atherosclerotic heart disease of native coronary artery without angina pectoris: Secondary | ICD-10-CM | POA: Insufficient documentation

## 2023-05-01 DIAGNOSIS — I10 Essential (primary) hypertension: Secondary | ICD-10-CM | POA: Insufficient documentation

## 2023-05-01 DIAGNOSIS — I714 Abdominal aortic aneurysm, without rupture, unspecified: Secondary | ICD-10-CM | POA: Insufficient documentation

## 2023-05-01 LAB — ECHOCARDIOGRAM COMPLETE
AR max vel: 0.71 cm2
AV Area VTI: 0.85 cm2
AV Area mean vel: 0.67 cm2
AV Mean grad: 40 mm[Hg]
AV Peak grad: 66.9 mm[Hg]
Ao pk vel: 4.09 m/s
Area-P 1/2: 2.16 cm2
S' Lateral: 2.7 cm

## 2023-05-06 ENCOUNTER — Encounter: Payer: Self-pay | Admitting: *Deleted

## 2023-05-06 ENCOUNTER — Ambulatory Visit
Payer: No Typology Code available for payment source | Attending: Cardiovascular Disease | Admitting: Cardiovascular Disease

## 2023-05-06 ENCOUNTER — Encounter: Payer: Self-pay | Admitting: Cardiovascular Disease

## 2023-05-06 VITALS — BP 136/70 | HR 71 | Ht 73.0 in | Wt 276.0 lb

## 2023-05-06 DIAGNOSIS — I7121 Aneurysm of the ascending aorta, without rupture: Secondary | ICD-10-CM | POA: Diagnosis not present

## 2023-05-06 DIAGNOSIS — I2510 Atherosclerotic heart disease of native coronary artery without angina pectoris: Secondary | ICD-10-CM

## 2023-05-06 DIAGNOSIS — I10 Essential (primary) hypertension: Secondary | ICD-10-CM | POA: Diagnosis not present

## 2023-05-06 DIAGNOSIS — I350 Nonrheumatic aortic (valve) stenosis: Secondary | ICD-10-CM | POA: Diagnosis not present

## 2023-05-06 NOTE — Progress Notes (Signed)
Pre Surgical Assessment: 5 M Walk Test  68M=16.74ft  5 Meter Walk Test- trial 1: 7.01 seconds 5 Meter Walk Test- trial 2: 7.41 seconds 5 Meter Walk Test- trial 3: 7.61 seconds 5 Meter Walk Test Average: 7.34 seconds  _________________________   Procedure Type: Isolated AVR Perioperative Outcome Estimate % Operative Mortality 5.95% Morbidity & Mortality 17.8% Stroke 1.48% Renal Failure 5.51% Reoperation 5.05% Prolonged Ventilation 7.49% Deep Sternal Wound Infection 0.092% Long Hospital Stay (>14 days) 10.7% Short Hospital Stay (<6 days)* 27.7%

## 2023-05-06 NOTE — Patient Instructions (Signed)
Medication Instructions:  No changes *If you need a refill on your cardiac medications before your next appointment, please call your pharmacy*   Lab Work: none If you have labs (blood work) drawn today and your tests are completely normal, you will receive your results only by: [place] (if you have MyChart) OR A paper copy in the mail If you have any lab test that is abnormal or we need to change your treatment, we will call you to review the results.   Testing/Procedures: none   Follow-Up: Per Structural Heart Team

## 2023-05-06 NOTE — Progress Notes (Signed)
Structural Heart Clinic Note  Chief Complaint  Patient presents with   Follow-up    Severe aortic valve stenosis   History of Present Illness: 82 yo male with history of diabetes mellitus, former tobacco abuse, HTN, AAA, sleep apnea, CAD and severe aortic stenosis who is here today for cardiac follow up. He was admitted to [facility] 11/21/21 with dyspnea and left arm pain and was found to have a NSTEMI. Cardiac cath with severe mid LAD stenosis treated with a drug eluting stent. Echo 11/22/21 with LVEF=60-65%. Normal RV function. Severe aortic stenosis with mean gradient of 40 mmHg, peak gradient 75.7 mmHg, AVA 0.92cm2. He was discharged on ASA and Plavix. Cardiac CT June 2023 with severe AS, valve area 505 mm2 suitable for a 26 mm Edwards Sapien 3 valve. CT demonstrates suitable anatomy for femoral access for TAVR. I saw him in the office in June 2023 to discuss his aortic stenosis and he was feeling much better following his PCI. We elected to follow his aortic stenosis. I saw him in December 2023 and May 2024 and he continued to do well with no change in baseline dyspnea on exertion. Most recent echo 05/01/23 with LVEF=60-65%. Severe aortic stenosis with mean gradient 49 mmHg, 86.7 mmHg, AVA 0.85 cm2, SVI 34, DI 0.24. He has a AAA, 4.5 cm in June 2024. Known thoracic aortic aneurysm, 4.0 cm CTA January 2024.   He is here today for follow up. He is now having more fatigue and dyspnea on exertion. His dyspnea has worsened over the past six months. No chest pain. He has had LE edema but this is resolved. His left leg was swollen back in July when he traveled to [place]. Venous dopplers were negative at the [place] in July. 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   He lives in [place], [place] alone. He is retired from Set [employer]. He has upper dentures and no active issues with his bottom teeth. He sees the dentist every six months.    Primary Care Physician: Howell Pringle, MD Primary Cardiologist:  Clifton James  Past Medical History:  Diagnosis Date   Arthritis    oa   Back pain    at times   Diabetes mellitus without complication (HCC)    diet controlled   Hard of hearing    Heart murmur    Hypertension    Measles 1950's   Mumps 1950's   Nocturia    Shortness of breath    on exertion   Sleep apnea    no CPAP    Past Surgical History:  Procedure Laterality Date   colonscopy     CORONARY STENT INTERVENTION N/A 11/22/2021   Procedure: CORONARY STENT INTERVENTION;  Surgeon: Iran Ouch, MD;  Location: [facility] INVASIVE CV LAB;  Service: Cardiovascular;  Laterality: N/A;   left partial knee replacement  2001   RIGHT/LEFT HEART CATH AND CORONARY ANGIOGRAPHY N/A 11/22/2021   Procedure: RIGHT/LEFT HEART CATH AND CORONARY ANGIOGRAPHY;  Surgeon: Iran Ouch, MD;  Location: [facility] INVASIVE CV LAB;  Service: Cardiovascular;  Laterality: N/A;   TOTAL KNEE ARTHROPLASTY Right 05/12/2014   Procedure: RIGHT TOTAL KNEE ARTHROPLASTY;  Surgeon: Loanne Drilling, MD;  Location: [facility] ORS;  Service: Orthopedics;  Laterality: Right;    Current Outpatient Medications  Medication Sig Dispense Refill   acetaminophen (TYLENOL) 325 MG tablet Take 2 tablets (650 mg total) by mouth every 6 (six) hours as needed for mild pain (or Fever >/= 101). 40  tablet 0   aspirin EC 81 MG tablet Take 81 mg by mouth daily. Swallow whole.     atorvastatin (LIPITOR) 80 MG tablet Take 1 tablet (80 mg total) by mouth daily. 30 tablet 2   carvedilol (COREG) 12.5 MG tablet Take 6.25 mg by mouth 2 (two) times daily with a meal.     Cholecalciferol 50 MCG (2000 UT) TABS Take 1 tablet by mouth daily.     clopidogrel (PLAVIX) 75 MG tablet Take 1 tablet (75 mg total) by mouth daily with breakfast. 30 tablet 3   diclofenac Sodium (VOLTAREN) 1 % GEL APPLY 2 GRAMS TO AFFECTED AREA THREE TIMES A DAY     glimepiride (AMARYL) 2 MG tablet TAKE ONE TABLET BY MOUTH EVERY MORNING FOR DIABETES     latanoprost (XALATAN) 0.005 % ophthalmic  solution Place 1 drop into both eyes at bedtime.     lidocaine (LIDODERM) 5 % APPLY 1 PATCH TO SKIN AS DIRECTED BY YOUR MEDICAL PROVIDER AS NEEDED (APPLY FOR 12 HOURS, THEN REMOVE FOR 12 HOURS) PLACE 12 HOURS ON AND 12 HOURS OFF     lisinopril-hydrochlorothiazide (ZESTORETIC) 10-12.5 MG tablet Take 1 tablet by mouth daily. 90 tablet 3   metFORMIN (GLUCOPHAGE-XR) 500 MG 24 hr tablet TAKE TWO TABLETS BY MOUTH BEFORE BREAKFAST AND TAKE ONE TABLET AT BEDTIME FOR DIABETES (ANNUAL KIDNEY FUNCTION TESTING IS NEEDED)     methocarbamol (ROBAXIN) 750 MG tablet TAKE ONE TABLET BY MOUTH THREE TIMES A DAY AS NEEDED *MAY CAUSE SLEEPINESS; DO NOT DRIVE AFTER TAKING IT 30 tablet 0   traZODone (DESYREL) 100 MG tablet Take 1 tablet by mouth at bedtime.     vitamin B-12 (CYANOCOBALAMIN) 500 MCG tablet Take 2 tablets by mouth daily.     nitroGLYCERIN (NITROSTAT) 0.4 MG SL tablet Place 1 tablet (0.4 mg total) under the tongue every 5 (five) minutes as needed for chest pain. 25 tablet 1   No current facility-administered medications for this visit.    No Known Allergies  Social History   Socioeconomic History   Marital status: Widowed    Spouse name: Not on file   Number of children: 2   Years of education: Not on file   Highest education level: Not on file  Occupational History   Occupation: Retired Research officer, trade union  Tobacco Use   Smoking status: Former    Current packs/day: 0.00    Average packs/day: 0.5 packs/day for 40.0 years (20.0 ttl pk-yrs)    Types: Cigarettes    Start date: 07/15/1980    Quit date: 07/15/2020    Years since quitting: 2.8   Smokeless tobacco: Never  Substance and Sexual Activity   Alcohol use: No   Drug use: No   Sexual activity: Not on file  Other Topics Concern   Not on file  Social History Narrative   Not on file   Social Determinants of Health   Financial Resource Strain: Not on file  Food Insecurity: Not on file  Transportation Needs: Not on file   Physical Activity: Not on file  Stress: Not on file  Social Connections: Unknown (11/22/2021)   Received from [facility], [facility]   Social Network    Social Network: Not on file  Intimate Partner Violence: Unknown (10/20/2021)   Received from [facility], [facility]   HITS    Physically Hurt: Not on file    Insult or Talk Down To: Not on file    Threaten Physical Harm: Not on  file    Scream or Curse: Not on file    Family History  Problem Relation Age of Onset   Breast cancer Sister        both sisters   Lung cancer Brother        both brothers    Review of Systems:  As stated in the HPI and otherwise negative.   BP 136/70   Pulse 71   Ht 6\' 1"  (1.854 m)   Wt 125.2 kg   SpO2 95%   BMI 36.41 kg/m   Physical Examination: General: Well developed, well nourished, NAD  HEENT: OP clear, mucus membranes moist  SKIN: warm, dry. No rashes. Neuro: No focal deficits  Musculoskeletal: Muscle strength 5/5 all ext  Psychiatric: Mood and affect normal  Neck: No JVD, no carotid bruits, no thyromegaly, no lymphadenopathy.  Lungs:Clear bilaterally, no wheezes, rhonci, crackles Cardiovascular: Regular rate and rhythm. Systolic murmur.  Abdomen:Soft. Bowel sounds present. Non-tender.  Extremities: No lower extremity edema. Pulses are 2 + in the bilateral DP/PT.  EKG:  EKG is not ordered today. The ekg ordered today demonstrates   Echo October 2024:  1. Left ventricular ejection fraction, by estimation, is 60 to 65%. Left  ventricular ejection fraction by 3D volume is 60 %. The left ventricle has  normal function. The left ventricle has no regional wall motion  abnormalities. There is moderate  concentric left ventricular hypertrophy. Left ventricular diastolic  parameters are consistent with Grade I diastolic dysfunction (impaired  relaxation).   2. Right ventricular systolic function is normal. The right ventricular  size is normal. There is normal pulmonary artery  systolic pressure.   3. The mitral valve is degenerative. Mild mitral valve regurgitation. No  evidence of mitral stenosis.   4. The aortic valve was not well visualized. Aortic valve regurgitation  is trivial. Severe aortic valve stenosis. Aortic valve area, by VTI  measures 0.85 cm. Aortic valve mean gradient measures 40.0 mmHg. Aortic  valve Vmax measures 4.09 m/s.   5. Aortic dilatation noted. There is borderline dilatation of the  ascending aorta, measuring 42 mm.   6. The inferior vena cava is normal in size with greater than 50%  respiratory variability, suggesting right atrial pressure of 3 mmHg.   FINDINGS   Left Ventricle: Left ventricular ejection fraction, by estimation, is 60  to 65%. Left ventricular ejection fraction by 3D volume is 60 %. The left  ventricle has normal function. The left ventricle has no regional wall  motion abnormalities. The left  ventricular internal cavity size was normal in size. There is moderate  concentric left ventricular hypertrophy. Left ventricular diastolic  parameters are consistent with Grade I diastolic dysfunction (impaired  relaxation).   Right Ventricle: The right ventricular size is normal. No increase in  right ventricular wall thickness. Right ventricular systolic function is  normal. There is normal pulmonary artery systolic pressure. The tricuspid  regurgitant velocity is 2.03 m/s, and   with an assumed right atrial pressure of 3 mmHg, the estimated right  ventricular systolic pressure is 19.5 mmHg.   Left Atrium: Left atrial size was normal in size.   Right Atrium: Right atrial size was normal in size.   Pericardium: There is no evidence of pericardial effusion. Presence of  epicardial fat layer.   Mitral Valve: The mitral valve is degenerative in appearance. Mild mitral  valve regurgitation. No evidence of mitral valve stenosis.   Tricuspid Valve: The tricuspid valve is normal in structure. Tricuspid  valve  regurgitation is trivial. No evidence of tricuspid stenosis.   Aortic Valve: The aortic valve was not well visualized. Aortic valve  regurgitation is trivial. Severe aortic stenosis is present. Aortic valve  mean gradient measures 40.0 mmHg. Aortic valve peak gradient measures 66.9  mmHg. Aortic valve area, by VTI  measures 0.85 cm.   Pulmonic Valve: The pulmonic valve was normal in structure. Pulmonic valve  regurgitation is trivial. No evidence of pulmonic stenosis.   Aorta: Aortic dilatation noted. There is borderline dilatation of the  ascending aorta, measuring 42 mm.   Venous: The inferior vena cava is normal in size with greater than 50%  respiratory variability, suggesting right atrial pressure of 3 mmHg.   IAS/Shunts: No atrial level shunt detected by color flow Doppler.     LEFT VENTRICLE  PLAX 2D  LVIDd:         4.10 cm         Diastology  LVIDs:         2.70 cm         LV e' medial:    5.48 cm/s  LV PW:         1.30 cm         LV E/e' medial:  6.5  LV IVS:        1.40 cm         LV e' lateral:   4.13 cm/s  LVOT diam:     2.10 cm         LV E/e' lateral: 8.6  LV SV:         82  LV SV Index:   34  LVOT Area:     3.46 cm        3D Volume EF                                 LV 3D EF:    Left                                              ventricul                                              ar                                              ejection                                              fraction                                              by 3D  volume is                                              60 %.                                   3D Volume EF:                                 3D EF:        60 %                                 LV EDV:       190 ml                                 LV ESV:       77 ml                                 LV SV:        113 ml   RIGHT VENTRICLE  RV Basal diam:  3.50 cm  RV Mid diam:     3.20 cm  RV S prime:     12.20 cm/s  TAPSE (M-mode): 2.1 cm   LEFT ATRIUM             Index        RIGHT ATRIUM           Index  LA diam:        4.40 cm 1.83 cm/m   RA Area:     14.40 cm  LA Vol (A2C):   42.2 ml 17.52 ml/m  RA Volume:   28.60 ml  11.87 ml/m  LA Vol (A4C):   36.4 ml 15.11 ml/m  LA Biplane Vol: 39.3 ml 16.32 ml/m   AORTIC VALVE  AV Area (Vmax):    0.71 cm  AV Area (Vmean):   0.67 cm  AV Area (VTI):     0.85 cm  AV Vmax:           409.00 cm/s  AV Vmean:          289.000 cm/s  AV VTI:            0.973 m  AV Peak Grad:      66.9 mmHg  AV Mean Grad:      40.0 mmHg  LVOT Vmax:         83.80 cm/s  LVOT Vmean:        56.100 cm/s  LVOT VTI:          0.238 m  LVOT/AV VTI ratio: 0.24    AORTA  Ao Root diam: 3.60 cm  Ao Asc diam:  4.20 cm   MITRAL VALVE               TRICUSPID VALVE  MV Area (PHT): 2.16 cm    TR Peak grad:   16.5 mmHg  MV Decel Time: 352 msec    TR Vmax:  203.00 cm/s  MV E velocity: 35.60 cm/s  MV A velocity: 64.50 cm/s  SHUNTS  MV E/A ratio:  0.55        Systemic VTI:  0.24 m                             Systemic Diam: 2.10 cm   Cardiac cath 11/22/21:   Mid LAD-1 lesion is 95% stenosed.   Mid LAD-2 lesion is 70% stenosed.   2nd Diag lesion is 60% stenosed.   A drug-eluting stent was successfully placed using a SYNERGY XD 3.0X48.   Post intervention, there is a 0% residual stenosis.   Post intervention, there is a 0% residual stenosis.   There is mild left ventricular systolic dysfunction.   LV end diastolic pressure is moderately elevated.   The left ventricular ejection fraction is 45-50% by visual estimate.   There is severe aortic valve stenosis.   1.  Left dominant coronary arteries with severe hazy stenosis in the mid LAD which is the likely culprit for non-ST elevation myocardial infarction. 2.  Right heart catheterization showed normal filling pressures, minimal pulmonary hypertension and normal cardiac output. 3.  Severely  calcified aortic valve with restricted opening.  There is evidence of severe aortic stenosis with mean gradient of 56 mmHg and valve area of 0.87 cm. 4.  Successful angioplasty and drug-eluting stent placement to the mid LAD.  A long stent was used to cover the second lesion in the mid LAD.  The second diagonal was jailed by the stent but had normal flow and no significant ostial stenosis.   Recommendations: Dual antiplatelet therapy for at least 12 months. Aggressive treatment of risk factors. Evaluate for TAVR.   Indications  Nonrheumatic aortic valve stenosis [I35.0 (ICD-10-CM)]  Non-ST elevation (NSTEMI) myocardial infarction (HCC) [I21.4 (ICD-10-CM)]   Clinical Presentation  CHF/Shock Congestive heart failure not present. No shock present.   Procedural Details  Technical Details Procedural Details: The pre-existing IV in the right antecubital vein was exchanged under sterile fashion to a slender sheath. The right wrist was prepped, draped, and anesthetized with 1% lidocaine. Using the modified Seldinger technique, a 5 French sheath was introduced into the right radial artery.3 mg of verapamil was administered through the sheath, weight-based unfractionated heparin was administered intravenously. Right heart catheterization was performed using a 5 French Swan-Ganz catheter. Cardiac output was calculated by the Fick method. A Jackie catheter was used for selective coronary angiography.  A JR4 catheter was needed for right coronary angiography.  It was also used to cross the aortic valve and perform left ventricular angiography with pullback.. Catheter exchanges were performed over an exchange length guidewire. There were no immediate procedural complications.   PCI note: The patient was found to have a high-grade mid LAD stenosis.  I decided to proceed with PCI.  Additional unfractionated heparin was given for anticoagulation.  600 mg of oral clopidogrel was given orally.  See PCI details.   The patient tolerated the procedure well with no immediate complications. A TR band was used for radial hemostasis at the completion of the procedure.  The patient was transferred to the post catheterization recovery area for further monitoring.   Estimated blood loss <50 mL.   During this procedure medications were administered to achieve and maintain moderate conscious sedation while the patient's heart rate, blood pressure, and oxygen saturation were continuously monitored and I was present face-to-face 100% of this time.  Medications (Filter: Administrations occurring from 1420 to 1624 on 11/22/21)  important  Continuous medications are totaled by the amount administered until 11/22/21 1624.   Heparin (Porcine) in NaCl 1000-0.9 UT/500ML-% SOLN (mL) Total volume:  1,000 mL Date/Time Rate/Dose/Volume Action   11/22/21 1431 500 mL Given   1431 500 mL Given    fentaNYL (SUBLIMAZE) injection (mcg) Total dose:  50 mcg Date/Time Rate/Dose/Volume Action   11/22/21 1436 50 mcg Given    midazolam (VERSED) injection (mg) Total dose:  1 mg Date/Time Rate/Dose/Volume Action   11/22/21 1436 1 mg Given    lidocaine (PF) (XYLOCAINE) 1 % injection (mL) Total volume:  6 mL Date/Time Rate/Dose/Volume Action   11/22/21 1443 3 mL Given   1443 3 mL Given    heparin sodium (porcine) injection (Units) Total dose:  12,000 Units Date/Time Rate/Dose/Volume Action   11/22/21 1455 6,000 Units Given   1510 6,000 Units Given    Radial Cocktail/Verapamil only (mL) Total volume:  10 mL Date/Time Rate/Dose/Volume Action   11/22/21 1453 10 mL Given    clopidogrel (PLAVIX) tablet (mg) Total dose:  600 mg Date/Time Rate/Dose/Volume Action   11/22/21 1512 600 mg Given    nitroGLYCERIN 1 mg/10 mL (100 mcg/mL) - IR/CATH LAB (mcg) Total dose:  200 mcg Date/Time Rate/Dose/Volume Action   11/22/21 1521 200 mcg Given    iohexol (OMNIPAQUE) 350 MG/ML injection (mL) Total volume:  130 mL Date/Time  Rate/Dose/Volume Action   11/22/21 1545 130 mL Given    acetaminophen (TYLENOL) tablet 650 mg (mg) Total dose:  Cannot be calculated* *Administration dose not documented Date/Time Rate/Dose/Volume Action   11/22/21 1426 *Not included in total MAR Hold    aspirin EC tablet 81 mg (mg) Total dose:  Cannot be calculated* *Administration dose not documented Date/Time Rate/Dose/Volume Action   11/22/21 1426 *Not included in total MAR Hold    atorvastatin (LIPITOR) tablet 80 mg (mg) Total dose:  Cannot be calculated* *Administration dose not documented Date/Time Rate/Dose/Volume Action   11/22/21 1426 *Not included in total MAR Hold    carvedilol (COREG) tablet 12.5 mg (mg) Total dose:  Cannot be calculated* *Administration dose not documented Date/Time Rate/Dose/Volume Action   11/22/21 1426 *Not included in total MAR Hold    glimepiride (AMARYL) tablet 2 mg (mg) Total dose:  Cannot be calculated* *Administration dose not documented Date/Time Rate/Dose/Volume Action   11/22/21 1426 *Not included in total MAR Hold    insulin aspart (novoLOG) injection 0-15 Units (Units) Total dose:  Cannot be calculated* *Administration dose not documented Date/Time Rate/Dose/Volume Action   11/22/21 1426 *Not included in total MAR Hold    nitroGLYCERIN 50 mg in dextrose 5 % 250 mL (0.2 mg/mL) infusion (mcg/min) Total dose:  Cannot be calculated* *Administration dose not documented Date/Time Rate/Dose/Volume Action   11/22/21 1426 *Not included in total MAR Hold    ondansetron (ZOFRAN) injection 4 mg (mg) Total dose:  Cannot be calculated* *Administration dose not documented Date/Time Rate/Dose/Volume Action   11/22/21 1426 *Not included in total MAR Hold    sodium chloride flush (NS) 0.9 % injection 3 mL (mL) Total dose:  Cannot be calculated* *Administration dose not documented Date/Time Rate/Dose/Volume Action   11/22/21 1426 *Not included in total MAR Hold    traZODone (DESYREL)  tablet 100 mg (mg) Total dose:  Cannot be calculated* *Administration dose not documented Date/Time Rate/Dose/Volume Action   11/22/21 1426 *Not included in total MAR Hold    vitamin B-12 (CYANOCOBALAMIN) tablet 1,000 mcg (mcg) Total  dose:  Cannot be calculated* *Administration dose not documented Date/Time Rate/Dose/Volume Action   11/22/21 1426 *Not included in total MAR Hold    Sedation Time  Sedation Time Physician-1: 1 hour 3 minutes 25 seconds Contrast  Medication Name Total Dose  iohexol (OMNIPAQUE) 350 MG/ML injection 130 mL   Radiation/Fluoro  Fluoro time: 15.2 (min) DAP: 112276 (mGycm2) Cumulative Air Kerma: 2246 (mGy) Complications  Complications documented before study signed (11/22/2021  4:39 PM)   No complications were associated with this study.  Documented by Velora Heckler, RT - 11/22/2021  3:43 PM     Coronary Findings  Diagnostic Dominance: Left Left Main  Vessel is angiographically normal.    Left Anterior Descending  Mid LAD-1 lesion is 95% stenosed. The lesion is type C and located at the major branch. The lesion is mildly calcified.  Mid LAD-2 lesion is 70% stenosed. The lesion is mildly calcified.    First Diagonal Branch  Vessel is large in size. Vessel is angiographically normal.    Second Diagonal Branch  2nd Diag lesion is 60% stenosed.    Ramus Intermedius  Vessel is small. Vessel is angiographically normal.    Left Circumflex    First Obtuse Marginal Branch  Vessel is moderate in size. Vessel is angiographically normal.    Second Obtuse Marginal Branch  Vessel is angiographically normal.    Left Posterior Descending Artery  Vessel is angiographically normal.    First Left Posterolateral Branch  Vessel is angiographically normal.    Second Left Posterolateral Branch  Vessel is angiographically normal.    Left Posterior Atrioventricular Artery  Vessel is angiographically normal.    Right Coronary Artery  Vessel is  angiographically normal.    Intervention   Mid LAD-1 lesion  Stent (Also treats lesions: Mid LAD-2)  Lesion length: 40 mm. CATH LAUNCHER 6FR EBU3.5 guide catheter was inserted. Lesion crossed with guidewire using a WIRE RUNTHROUGH .[id number]. Pre-stent angioplasty was performed using a BALLN SAPPHIRE 2.5X15. Maximum pressure: 10 atm. Inflation time: 20 sec. A drug-eluting stent was successfully placed using a SYNERGY XD 3.0X48. Maximum pressure: 14 atm. Inflation time: 20 sec. Post-stent angioplasty was performed using a BALL SAPPHIRE NC24 3.5X15. Maximum pressure: 16 atm. Inflation time: 20 sec. The distal area of the stent was postdilated to 10 atm in the proximal area was postdilated to 16 atm.  Post-Intervention Lesion Assessment  The intervention was successful. Pre-interventional TIMI flow is 3. Post-intervention TIMI flow is 3. No complications occurred at this lesion.  There is a 0% residual stenosis post intervention.    Mid LAD-2 lesion  Stent (Also treats lesions: Mid LAD-1)  See details in Mid LAD-1 lesion.  Post-Intervention Lesion Assessment  The intervention was successful. Pre-interventional TIMI flow is 3. Post-intervention TIMI flow is 3. No complications occurred at this lesion.  There is a 0% residual stenosis post intervention.     Left Heart  Left Ventricle The left ventricular size is normal. There is mild left ventricular systolic dysfunction. LV end diastolic pressure is moderately elevated. The left ventricular ejection fraction is 45-50% by visual estimate. There are LV function abnormalities due to segmental dysfunction.  Aortic Valve There is severe aortic valve stenosis. The aortic valve is calcified. There is restricted aortic valve motion.   Coronary Diagrams  Diagnostic Dominance: Left Intervention  Implants     Permanent Stent  Synergy Xd 3.0x48 - [id number] - Implanted Inventory item: Phylliss Bob 3.0X48 Model/Cat number: [id number]   Manufacturer: Norvel Richards Lot  number: [id number]  Device identifier: [id number] Device identifier type: GS1  GUDID Information  Request status Successful    Brand name: Lacie Scotts Version/Model: [id number]  Company name: [employer] MRI safety info as of 11/22/21: MR Conditional  Contains dry or latex rubber: No    GMDN P.T. name: Drug-eluting coronary artery stent, non-bioabsorbable-polymer-coated     As of 11/22/2021  Status: Implanted       Syngo Images   Show images for CARDIAC CATHETERIZATION Images on Long Term Storage   Show images for Weldy, Caffie Damme. Link to Procedure Log  Procedure Log    Hemo Data  Flowsheet Row Most Recent Value  Fick Cardiac Output 5.93 L/min  Fick Cardiac Output Index 2.43 (L/min)/BSA  Aortic Mean Gradient 56.23 mmHg  Aortic Peak Gradient 57.8 mmHg  Aortic Valve Area 0.87  Aortic Value Area Index 0.36 cm2/BSA  RA A Wave 13 mmHg  RA V Wave 8 mmHg  RA Mean 6 mmHg  RV Systolic Pressure 32 mmHg  RV Diastolic Pressure 4 mmHg  RV EDP 10 mmHg  PA Systolic Pressure 31 mmHg  PA Diastolic Pressure 8 mmHg  PA Mean 16 mmHg  PW A Wave 17 mmHg  PW V Wave 18 mmHg  PW Mean 6 mmHg  AO Systolic Pressure 114 mmHg  AO Diastolic Pressure 62 mmHg  AO Mean 79 mmHg  LV Systolic Pressure 183 mmHg  LV Diastolic Pressure 8 mmHg  LV EDP 18 mmHg  AOp Systolic Pressure 131 mmHg  AOp Diastolic Pressure 67 mmHg  AOp Mean Pressure 90 mmHg  LVp Systolic Pressure 182 mmHg  LVp Diastolic Pressure 13 mmHg  LVp EDP Pressure 25 mmHg  QP/QS 1  TPVR Index 6.59 HRUI  TSVR Index 32.56 HRUI  PVR SVR Ratio 0.14  TPVR/TSVR Ratio     Recent Labs: 07/04/2022: BUN 13; Creatinine, Ser 1.20; Potassium 4.0; Sodium 143 12/14/2022: ALT 68   Lipid Panel    Component Value Date/Time   CHOL 98 (L) 12/14/2022 0927   TRIG 106 12/14/2022 0927   HDL 99 12/14/2022 0927   CHOLHDL 1.0 12/14/2022 0927   CHOLHDL 2.8 11/22/2021 0412   VLDL 35  11/22/2021 0412   LDLCALC Comment (A) 12/14/2022 0927   LDLDIRECT 31 12/18/2022 0729     Wt Readings from Last 3 Encounters:  05/06/23 125.2 kg  12/14/22 118.3 kg  06/20/22 123.4 kg    Assessment and Plan:   1. 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 He is now having progressive dyspnea on exertion and fatigue. NYHA class III symptoms.  I have personally reviewed the echo images today and the aortic valve is thickened, calcified with limited leaflet mobility. He has had his pre TAVR CT scans. He will be sized to a 26 mm Edwards Sapien 3 valve and appears to have femoral access. I think he would benefit from AVR. Given advanced age, he is not a good candidate for conventional AVR by surgical approach. He appears to be a good candidate for TAVR.   I have reviewed the natural history of aortic stenosis with the patient and their family members  who are present today. We have discussed the limitations of medical therapy and the poor prognosis associated with symptomatic aortic stenosis. We have reviewed potential treatment options, including palliative medical therapy, conventional surgical aortic valve replacement, and transcatheter aortic valve replacement. We discussed treatment options in the context of the patient's specific comorbid medical conditions.   I  will refer him to see one of the CT surgeons on our structural heart team. Following his surgical consultation, we will plan a date for TAVR. I do not think we need to repeat his cardiac catheterization or pre TAVR CT scans.   2. CAD without angina: No chest pain suggestive of angina. Will continue ASA, Plavix, statin and beta blocker.    3. 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 Will follow with yearly scans. Repeat chest CTA in January 2025.   4. AAA: 4.5 cm in June 2024 by u/s.   5. HTN: BP is controlled. No changes  6. Pancreatic cysts: Will need  MRI/MRCP in June 2025.      7. Hyperlipidemia: LDL at goal in June 2024. Continue statin  Labs/ tests ordered today include:  No orders of the defined types were placed in this encounter.  Disposition:   Will arrange an appt with Dr. Laneta Simmers or Dr. Leafy Ro. F/U with me in 6 months. He will have f/u with our structural heart team following his TAVR.    Signed, Verne Carrow, MD 05/06/2023 2:47 PM    [facility] [address] [place], [place], [place]  [address] Phone: [phone]; Fax: [phone]

## 2023-05-08 ENCOUNTER — Ambulatory Visit: Payer: No Typology Code available for payment source | Admitting: Cardiovascular Disease

## 2023-05-08 ENCOUNTER — Other Ambulatory Visit: Payer: Self-pay

## 2023-05-08 DIAGNOSIS — I350 Nonrheumatic aortic (valve) stenosis: Secondary | ICD-10-CM

## 2023-05-08 NOTE — Progress Notes (Unsigned)
[address]       [place] [address]             [id number]           Marygrace Drought Sr. [facility] Medical Record #[id number] Date of Birth: 15-Mar-1941  Lenon Ahmadi, MD  Chief Complaint: progressive DOE    History of Present Illness:     Pt is a pleasant 82 yo male with previous work up of CP resulting in LAD stenting last year. At that time was noted to have severe AS and follow up with cardiology was found to be relatively asymptomatic and was followed. Pt however has become more fatigued and with progressive DOE that with ongoing TEE findings of severe AS, was felt to be a candidate for AVR and was at his age best suited for TAVR. Pt here for surgical consultation      Past Medical History:  Diagnosis Date   Arthritis    oa   Back pain    at times   Diabetes mellitus without complication (HCC)    diet controlled   Hard of hearing    Heart murmur    Hypertension    Measles 1950's   Mumps 1950's   Nocturia    Shortness of breath    on exertion   Sleep apnea    no CPAP    Past Surgical History:  Procedure Laterality Date   colonscopy     CORONARY STENT INTERVENTION N/A 11/22/2021   Procedure: CORONARY STENT INTERVENTION;  Surgeon: Iran Ouch, MD;  Location: [facility];  Service: Cardiovascular;  Laterality: N/A;   left partial knee replacement  2001   RIGHT/LEFT HEART CATH AND CORONARY ANGIOGRAPHY N/A 11/22/2021   Procedure: RIGHT/LEFT HEART CATH AND CORONARY ANGIOGRAPHY;  Surgeon: Iran Ouch, MD;  Location: [facility];  Service: Cardiovascular;  Laterality: N/A;   TOTAL KNEE ARTHROPLASTY Right 05/12/2014   Procedure: RIGHT TOTAL KNEE ARTHROPLASTY;  Surgeon: Loanne Drilling, MD;  Location: [facility];  Service: Orthopedics;  Laterality: Right;    Social History   Tobacco Use  Smoking Status Former   Current packs/day: 0.00   Average packs/day: 0.5 packs/day for 40.0 years (20.0 ttl  pk-yrs)   Types: Cigarettes   Start date: 07/15/1980   Quit date: 07/15/2020   Years since quitting: 2.8  Smokeless Tobacco Never    Social History   Substance and Sexual Activity  Alcohol Use No    Social History   Socioeconomic History   Marital status: Widowed    Spouse name: Not on file   Number of children: 2   Years of education: Not on file   Highest education level: Not on file  Occupational History   Occupation: Retired Research officer, trade [employer]  Tobacco Use   Smoking status: Former    Current packs/day: 0.00    Average packs/day: 0.5 packs/day for 40.0 years (20.0 ttl pk-yrs)    Types: Cigarettes    Start date: 07/15/1980    Quit date: 07/15/2020    Years since quitting: 2.8   Smokeless tobacco: Never  Substance and Sexual Activity   Alcohol use: No   Drug use: No   Sexual activity: Not on file  Other Topics Concern   Not on file  Social History Narrative   Not on file   Social Determinants of Health   Financial Resource Strain: Not on file  Food Insecurity: Not  on file  Transportation Needs: Not on file  Physical Activity: Not on file  Stress: Not on file  Social Connections: Unknown (11/22/2021)   Received from [facility]   Social Network    Social Network: Not on file  Intimate Partner Violence: Unknown (10/20/2021)   Received from [facility]   HITS    Physically Hurt: Not on file    Insult or Talk Down To: Not on file    Threaten Physical Harm: Not on file    Scream or Curse: Not on file    No Known Allergies  Current Outpatient Medications  Medication Sig Dispense Refill   acetaminophen (TYLENOL) 325 MG tablet Take 2 tablets (650 mg total) by mouth every 6 (six) hours as needed for mild pain (or Fever >/= 101). 40 tablet 0   aspirin EC 81 MG tablet Take 81 mg by mouth daily. Swallow whole.     atorvastatin (LIPITOR) 80 MG tablet Take 1 tablet (80 mg total) by mouth daily. 30 tablet 2   carvedilol (COREG)  12.5 MG tablet Take 6.25 mg by mouth 2 (two) times daily with a meal.     Cholecalciferol 50 MCG (2000 UT) TABS Take 1 tablet by mouth daily.     clopidogrel (PLAVIX) 75 MG tablet Take 1 tablet (75 mg total) by mouth daily with breakfast. 30 tablet 3   diclofenac Sodium (VOLTAREN) 1 % GEL APPLY 2 GRAMS TO AFFECTED AREA THREE TIMES A DAY     glimepiride (AMARYL) 2 MG tablet TAKE ONE TABLET BY MOUTH EVERY MORNING FOR DIABETES     latanoprost (XALATAN) 0.005 % ophthalmic solution Place 1 drop into both eyes at bedtime.     lidocaine (LIDODERM) 5 % APPLY 1 PATCH TO SKIN AS DIRECTED BY YOUR MEDICAL PROVIDER AS NEEDED (APPLY FOR 12 HOURS, THEN REMOVE FOR 12 HOURS) PLACE 12 HOURS ON AND 12 HOURS OFF     lisinopril-hydrochlorothiazide (ZESTORETIC) 10-12.5 MG tablet Take 1 tablet by mouth daily. 90 tablet 3   metFORMIN (GLUCOPHAGE-XR) 500 MG 24 hr tablet TAKE TWO TABLETS BY MOUTH BEFORE BREAKFAST AND TAKE ONE TABLET AT BEDTIME FOR DIABETES (ANNUAL KIDNEY FUNCTION TESTING IS NEEDED)     methocarbamol (ROBAXIN) 750 MG tablet TAKE ONE TABLET BY MOUTH THREE TIMES A DAY AS NEEDED *MAY CAUSE SLEEPINESS; DO NOT DRIVE AFTER TAKING IT 30 tablet 0   nitroGLYCERIN (NITROSTAT) 0.4 MG SL tablet Place 1 tablet (0.4 mg total) under the tongue every 5 (five) minutes as needed for chest pain. 25 tablet 1   traZODone (DESYREL) 100 MG tablet Take 1 tablet by mouth at bedtime.     vitamin B-12 (CYANOCOBALAMIN) 500 MCG tablet Take 2 tablets by mouth daily.     No current facility-administered medications for this visit.     Family History  Problem Relation Age of Onset   Breast cancer Sister        both sisters   Lung cancer Brother        both brothers       Physical Exam: Teeth in good repair Walks with cane Lungs: clear Cardiac: rr with 2/6 sem Ext: no edema Neuro: intact     Diagnostic Studies & Laboratory data: I have personally reviewed the following studies and agree with the findings   TTE  (19/2024) IMPRESSIONS     1. Left ventricular ejection fraction, by estimation, is 60 to 65%. Left  ventricular ejection fraction by 3D volume is 60 %.  The left ventricle has  normal function. The left ventricle has no regional wall motion  abnormalities. There is moderate  concentric left ventricular hypertrophy. Left ventricular diastolic  parameters are consistent with Grade I diastolic dysfunction (impaired  relaxation).   2. Right ventricular systolic function is normal. The right ventricular  size is normal. There is normal pulmonary artery systolic pressure.   3. The mitral valve is degenerative. Mild mitral valve regurgitation. No  evidence of mitral stenosis.   4. The aortic valve was not well visualized. Aortic valve regurgitation  is trivial. Severe aortic valve stenosis. Aortic valve area, by VTI  measures 0.85 cm. Aortic valve mean gradient measures 40.0 mmHg. Aortic  valve Vmax measures 4.09 m/s.   5. Aortic dilatation noted. There is borderline dilatation of the  ascending aorta, measuring 42 mm.   6. The inferior vena cava is normal in size with greater than 50%  respiratory variability, suggesting right atrial pressure of 3 mmHg.   FINDINGS   Left Ventricle: Left ventricular ejection fraction, by estimation, is 60  to 65%. Left ventricular ejection fraction by 3D volume is 60 %. The left  ventricle has normal function. The left ventricle has no regional wall  motion abnormalities. The left  ventricular internal cavity size was normal in size. There is moderate  concentric left ventricular hypertrophy. Left ventricular diastolic  parameters are consistent with Grade I diastolic dysfunction (impaired  relaxation).   Right Ventricle: The right ventricular size is normal. No increase in  right ventricular wall thickness. Right ventricular systolic function is  normal. There is normal pulmonary artery systolic pressure. The tricuspid  regurgitant velocity is 2.03  m/s, and   with an assumed right atrial pressure of 3 mmHg, the estimated right  ventricular systolic pressure is 19.5 mmHg.   Left Atrium: Left atrial size was normal in size.   Right Atrium: Right atrial size was normal in size.   Pericardium: There is no evidence of pericardial effusion. Presence of  epicardial fat layer.   Mitral Valve: The mitral valve is degenerative in appearance. Mild mitral  valve regurgitation. No evidence of mitral valve stenosis.   Tricuspid Valve: The tricuspid valve is normal in structure. Tricuspid  valve regurgitation is trivial. No evidence of tricuspid stenosis.   Aortic Valve: The aortic valve was not well visualized. Aortic valve  regurgitation is trivial. Severe aortic stenosis is present. Aortic valve  mean gradient measures 40.0 mmHg. Aortic valve peak gradient measures 66.9  mmHg. Aortic valve area, by VTI  measures 0.85 cm.   Pulmonic Valve: The pulmonic valve was normal in structure. Pulmonic valve  regurgitation is trivial. No evidence of pulmonic stenosis.   Aorta: Aortic dilatation noted. There is borderline dilatation of the  ascending aorta, measuring 42 mm.   Venous: The inferior vena cava is normal in size with greater than 50%  respiratory variability, suggesting right atrial pressure of 3 mmHg.   IAS/Shunts: No atrial level shunt detected by color flow Doppler.     LEFT VENTRICLE  PLAX 2D  LVIDd:         4.10 cm         Diastology  LVIDs:         2.70 cm         LV e' medial:    5.48 cm/s  LV PW:         1.30 cm         LV E/e' medial:  6.5  LV IVS:        1.40 cm         LV e' lateral:   4.13 cm/s  LVOT diam:     2.10 cm         LV E/e' lateral: 8.6  LV SV:         82  LV SV Index:   34  LVOT Area:     3.46 cm        3D Volume EF                                 LV 3D EF:    Left                                              ventricul                                              ar                                               ejection                                              fraction                                              by 3D                                              volume is                                              60 %.                                   3D Volume EF:                                 3D EF:        60 %                                 LV EDV:       190 ml  LV ESV:       77 ml                                 LV SV:        113 ml   RIGHT VENTRICLE  RV Basal diam:  3.50 cm  RV Mid diam:    3.20 cm  RV S prime:     12.20 cm/s  TAPSE (M-mode): 2.1 cm   LEFT ATRIUM             Index        RIGHT ATRIUM           Index  LA diam:        4.40 cm 1.83 cm/m   RA Area:     14.40 cm  LA Vol (A2C):   42.2 ml 17.52 ml/m  RA Volume:   28.60 ml  11.87 ml/m  LA Vol (A4C):   36.4 ml 15.11 ml/m  LA Biplane Vol: 39.3 ml 16.32 ml/m   AORTIC VALVE  AV Area (Vmax):    0.71 cm  AV Area (Vmean):   0.67 cm  AV Area (VTI):     0.85 cm  AV Vmax:           409.00 cm/s  AV Vmean:          289.000 cm/s  AV VTI:            0.973 m  AV Peak Grad:      66.9 mmHg  AV Mean Grad:      40.0 mmHg  LVOT Vmax:         83.80 cm/s  LVOT Vmean:        56.100 cm/s  LVOT VTI:          0.238 m  LVOT/AV VTI ratio: 0.24    AORTA  Ao Root diam: 3.60 cm  Ao Asc diam:  4.20 cm   MITRAL VALVE               TRICUSPID VALVE  MV Area (PHT): 2.16 cm    TR Peak grad:   16.5 mmHg  MV Decel Time: 352 msec    TR Vmax:        203.00 cm/s  MV E velocity: 35.60 cm/s  MV A velocity: 64.50 cm/s  SHUNTS  MV E/A ratio:  0.55        Systemic VTI:  0.24 m                             Systemic Diam: 2.10 cm   CATH (11/2021) Conclusion      Mid LAD-1 lesion is 95% stenosed.   Mid LAD-2 lesion is 70% stenosed.   2nd Diag lesion is 60% stenosed.   A drug-eluting stent was successfully placed using a SYNERGY XD 3.0X48.   Post intervention, there is a 0% residual stenosis.    Post intervention, there is a 0% residual stenosis.   There is mild left ventricular systolic dysfunction.   LV end diastolic pressure is moderately elevated.   The left ventricular ejection fraction is 45-50% by visual estimate.   There is severe aortic valve stenosis.   1.  Left dominant coronary arteries with severe hazy stenosis in the mid LAD which is the likely culprit for non-ST elevation myocardial infarction. 2.  Right heart  catheterization showed normal filling pressures, minimal pulmonary hypertension and normal cardiac output. 3.  Severely calcified aortic valve with restricted opening.  There is evidence of severe aortic stenosis with mean gradient of 56 mmHg and valve area of 0.87 cm. 4.  Successful angioplasty and drug-eluting stent placement to the mid LAD.  A long stent was used to cover the second lesion in the mid LAD.  The second diagonal was jailed by the stent but had normal flow and no significant ostial stenosis.   Recommendations: Dual antiplatelet therapy for at least 12 months. Aggressive treatment of risk factors. Evaluate for TAVR.   Recent Radiology Findings:   CTA (12/2021) FINDINGS: Aortic Valve:   Tricuspid aortic valve. Severely reduced cusp separation. Severely thickened, severely calcified aortic valve cusps.   AV calcium score: 4555   Virtual Basal Annulus Measurements:   Maximum/Minimum Diameter: 28.5 x 23.8 mm   Perimeter: 80.9 mm   Area:  505 mm2   Mild LVOT calcifications.   Membranous septal length: 4.2 mm   Based on these measurements, the annulus would be suitable for a 26 mm Sapien 3 valve.   Sinus of Valsalva Measurements:   Non-coronary:  36 mm   Right - coronary:  33 mm   Left - coronary:  36 mm   Sinus of Valsalva Height:   Left: 31.7 mm   Right: 34.5 mm   Aorta: Conventional 3 vessel branch pattern of aortic arch.   Sinotubular Junction:  35 mm   Ascending Thoracic Aorta:  39 mm   Aortic Arch:  30 mm    Descending Thoracic Aorta:  31 mm   Coronary Artery Height above Annulus:   Left main: 26.1 mm   Right coronary: 19.8 mm   Coronary Arteries: Normal coronary origin. Left dominance. The study was performed without use of NTG and insufficient for plaque evaluation.   Optimum Fluoroscopic Angle for Delivery: LAO 8 CAU 6   OTHER:   Atria:   Left atrial appendage: No thrombus.   Mitral valve: Grossly normal, mild mitral annular calcifications.   Pulmonary veins: Normal anatomy.   IMPRESSION: 1. Tricuspid aortic valve with severely reduced cusp excursion. Severely thickened and severely calcified aortic valve cusps.   2.  Aortic valve calcium score: 4555   3. Annulus area: 505 mm2, suitable for 26 mm Sapien 3 valve. Mild LVOT calcifications. Membranous septal length 4.2 mm.   4.  Sufficient coronary artery heights from annulus.   5.  Optimum fluoroscopic angle for delivery: LAO 8 CAU 6   6. Ascending aorta measures 39 mm, borderline measurement likely upper limit of normal.     Electronically Signed   By: Weston Brass M.D.   On: 12/27/2021 06:02    Addended by Parke Poisson, MD on 12/27/2021  6:04 AM    Study Result  Narrative & Impression  EXAM: OVER-READ INTERPRETATION  CT CHEST   The following report is a limited chest CT over-read performed by radiologist Dr. Allegra Lai of [facility] on 12/25/2021. This over-read does not include interpretation of cardiac or coronary anatomy or pathology. The TAVR interpretation by the cardiologist is attached.   COMPARISON:  None Available.   FINDINGS: Extracardiac findings will be described separately under dictation for contemporaneously obtained CTA chest, abdomen and pelvis.   IMPRESSION: Please see separate dictation for contemporaneously obtained CTA chest, abdomen and pelvis dated 12/25/2021 for full description of relevant extracardiac findings.         Recent  Lab  Findings: Lab Results  Component Value Date   WBC 5.2 11/23/2021   HGB 11.8 (L) 11/23/2021   HCT 35.3 (L) 11/23/2021   PLT 86 (L) 11/23/2021   GLUCOSE 165 (H) 07/04/2022   CHOL 98 (L) 12/14/2022   TRIG 106 12/14/2022   HDL 99 12/14/2022   LDLDIRECT 31 12/18/2022   LDLCALC Comment (A) 12/14/2022   ALT 68 (H) 12/14/2022   AST 43 (H) 12/14/2022   NA 143 07/04/2022   K 4.0 07/04/2022   CL 104 07/04/2022   CREATININE 1.20 07/04/2022   BUN 13 07/04/2022   CO2 23 07/04/2022   INR 1.09 05/05/2014   HGBA1C 6.7 (H) 11/22/2021      Assessment / Plan:     Pt is an 82 yr old male with NYHA class 2 symptoms of severe AS with normal LV function with previous LAD stenting. Pt has no cp and has had TAVR work up in past with acceptable anatomy for a 23mm Sapien valve with femoral access. Pt has not had recent cath or CTA however without CP would be reasonable not to repeat cath. We discussed the risks and goals of surgery and he wishes to proceed with TAVR. He would be a candidate for bailout if issues arise with TAVR.    I have spent 60 min in review of the records, viewing studies and in face to face with patient and in coordination of future care    Eugenio Hoes 05/08/2023 4:47 PM

## 2023-05-09 ENCOUNTER — Institutional Professional Consult (permissible substitution): Payer: No Typology Code available for payment source | Admitting: Thoracic Surgery (Cardiothoracic Vascular Surgery)

## 2023-05-09 ENCOUNTER — Encounter: Payer: Self-pay | Admitting: Thoracic Surgery (Cardiothoracic Vascular Surgery)

## 2023-05-09 VITALS — BP 147/80 | HR 80 | Resp 20 | Ht 73.0 in | Wt 278.0 lb

## 2023-05-09 DIAGNOSIS — I350 Nonrheumatic aortic (valve) stenosis: Secondary | ICD-10-CM

## 2023-05-09 NOTE — Patient Instructions (Signed)
TAVR

## 2023-05-14 ENCOUNTER — Other Ambulatory Visit: Payer: Self-pay

## 2023-05-14 DIAGNOSIS — I350 Nonrheumatic aortic (valve) stenosis: Secondary | ICD-10-CM

## 2023-05-31 ENCOUNTER — Encounter (HOSPITAL_COMMUNITY)
Admission: RE | Admit: 2023-05-31 | Discharge: 2023-05-31 | Disposition: A | Discharge status: Home / Self Care | Payer: No Typology Code available for payment source | Source: Ambulatory Visit | Attending: Cardiovascular Disease | Admitting: Cardiovascular Disease

## 2023-05-31 ENCOUNTER — Other Ambulatory Visit: Payer: Self-pay

## 2023-05-31 ENCOUNTER — Ambulatory Visit (HOSPITAL_COMMUNITY)
Admission: RE | Admit: 2023-05-31 | Discharge: 2023-05-31 | Disposition: A | Discharge status: Home / Self Care | Payer: No Typology Code available for payment source | Source: Ambulatory Visit | Attending: Cardiovascular Disease | Admitting: Cardiovascular Disease

## 2023-05-31 DIAGNOSIS — Z01812 Encounter for preprocedural laboratory examination: Secondary | ICD-10-CM | POA: Diagnosis present

## 2023-05-31 DIAGNOSIS — Z1152 Encounter for screening for COVID-19: Secondary | ICD-10-CM | POA: Diagnosis not present

## 2023-05-31 DIAGNOSIS — Z01818 Encounter for other preprocedural examination: Secondary | ICD-10-CM | POA: Insufficient documentation

## 2023-05-31 DIAGNOSIS — Z01810 Encounter for preprocedural cardiovascular examination: Secondary | ICD-10-CM | POA: Diagnosis present

## 2023-05-31 DIAGNOSIS — Z01811 Encounter for preprocedural respiratory examination: Secondary | ICD-10-CM | POA: Diagnosis present

## 2023-05-31 DIAGNOSIS — I350 Nonrheumatic aortic (valve) stenosis: Secondary | ICD-10-CM | POA: Insufficient documentation

## 2023-05-31 LAB — URINALYSIS, ROUTINE W REFLEX MICROSCOPIC
Bilirubin Urine: NEGATIVE
Glucose, UA: NEGATIVE mg/dL
Hgb urine dipstick: NEGATIVE
Ketones, ur: NEGATIVE mg/dL
Nitrite: POSITIVE — AB
Protein, ur: NEGATIVE mg/dL
Specific Gravity, Urine: 1.018 (ref 1.005–1.030)
pH: 6 (ref 5.0–8.0)

## 2023-05-31 LAB — COMPREHENSIVE METABOLIC PANEL
ALT: 43 U/L (ref 0–44)
AST: 29 U/L (ref 15–41)
Albumin: 3.8 g/dL (ref 3.5–5.0)
Alkaline Phosphatase: 62 U/L (ref 38–126)
Anion gap: 11 (ref 5–15)
BUN: 15 mg/dL (ref 8–23)
CO2: 22 mmol/L (ref 22–32)
Calcium: 8.8 mg/dL — ABNORMAL LOW (ref 8.9–10.3)
Chloride: 107 mmol/L (ref 98–111)
Creatinine, Ser: 1.38 mg/dL — ABNORMAL HIGH (ref 0.61–1.24)
GFR, Estimated: 52 mL/min — ABNORMAL LOW (ref >60)
Glucose, Bld: 157 mg/dL — ABNORMAL HIGH (ref 70–99)
Potassium: 4.1 mmol/L (ref 3.5–5.1)
Sodium: 140 mmol/L (ref 135–145)
Total Bilirubin: 1.2 mg/dL — ABNORMAL HIGH (ref <1.2)
Total Protein: 6.4 g/dL — ABNORMAL LOW (ref 6.5–8.1)

## 2023-05-31 LAB — CBC
HCT: 41.3 % (ref 39.0–52.0)
Hemoglobin: 13.5 g/dL (ref 13.0–17.0)
MCH: 31.5 pg (ref 26.0–34.0)
MCHC: 32.7 g/dL (ref 30.0–36.0)
MCV: 96.3 fL (ref 80.0–100.0)
Platelets: 94 10*3/uL — ABNORMAL LOW (ref 150–400)
RBC: 4.29 MIL/uL (ref 4.22–5.81)
RDW: 13.9 % (ref 11.5–15.5)
WBC: 5.9 10*3/uL (ref 4.0–10.5)
nRBC: 0 % (ref 0.0–0.2)

## 2023-05-31 LAB — TYPE AND SCREEN
ABO/RH(D): O POS
Antibody Screen: NEGATIVE

## 2023-05-31 LAB — PROTIME-INR
INR: 1 (ref 0.8–1.2)
Prothrombin Time: 13.4 s (ref 11.4–15.2)

## 2023-05-31 LAB — SURGICAL PCR SCREEN
MRSA, PCR: NEGATIVE
Staphylococcus aureus: NEGATIVE

## 2023-05-31 LAB — SARS CORONAVIRUS 2 (TAT 6-24 HRS): SARS Coronavirus 2: NEGATIVE

## 2023-05-31 NOTE — Progress Notes (Signed)
Patient signed all consents at PAT lab appointment. CHG soap and instructions were given to patient. CHG surgical prep reviewed with patient and all questions answered.  Patients chart sent to anesthesia for review. Pt denies any respiratory illness/infection in the last two months.

## 2023-06-03 MED ORDER — NOREPINEPHRINE 4 MG/250ML-% IV SOLN
0.0000 ug/min | INTRAVENOUS | Status: AC
  Administered 2023-06-04: 2 ug/min via INTRAVENOUS
  Filled 2023-06-03: qty 250

## 2023-06-03 MED ORDER — CEFAZOLIN IN SODIUM CHLORIDE 3-0.9 GM/100ML-% IV SOLN
3.0000 g | INTRAVENOUS | Status: DC
  Filled 2023-06-03 (×3): qty 100

## 2023-06-03 MED ORDER — POTASSIUM CHLORIDE 2 MEQ/ML IV SOLN
80.0000 meq | INTRAVENOUS | Status: DC
  Filled 2023-06-03 (×2): qty 40

## 2023-06-03 MED ORDER — DEXMEDETOMIDINE HCL IN NACL 400 MCG/100ML IV SOLN
0.1000 ug/kg/h | INTRAVENOUS | Status: AC
  Administered 2023-06-04: 1 ug/kg/h via INTRAVENOUS
  Filled 2023-06-03: qty 100

## 2023-06-03 MED ORDER — HEPARIN 30,000 UNITS/1000 ML (OHS) CELLSAVER SOLUTION
Status: DC
  Filled 2023-06-03 (×2): qty 1000

## 2023-06-03 MED ORDER — MAGNESIUM SULFATE 50 % IJ SOLN
40.0000 meq | INTRAMUSCULAR | Status: DC
  Filled 2023-06-03 (×2): qty 9.85

## 2023-06-03 NOTE — H&P (Signed)
[address]       [place] [address]             [phone]                                   Marygrace Drought Sr. [facility] Medical Record #[id number] Date of Birth: 1941-01-23   Lenon Ahmadi, MD   Chief Complaint: progressive DOE     History of Present Illness:     Pt is a pleasant 82 yo male with previous work up of CP resulting in LAD stenting last year. At that time was noted to have severe AS and follow up with cardiology was found to be relatively asymptomatic and was followed. Pt however has become more fatigued and with progressive DOE that with ongoing TEE findings of severe AS, was felt to be a candidate for AVR and was at his age best suited for TAVR. Pt here for surgical consultation             Past Medical History:  Diagnosis Date   Arthritis      oa   Back pain      at times   Diabetes mellitus without complication (HCC)      diet controlled   Hard of hearing     Heart murmur     Hypertension     Measles 1950's   Mumps 1950's   Nocturia     Shortness of breath      on exertion   Sleep apnea      no CPAP               Past Surgical History:  Procedure Laterality Date   colonscopy       CORONARY STENT INTERVENTION N/A 11/22/2021    Procedure: CORONARY STENT INTERVENTION;  Surgeon: Iran Ouch, MD;  Location: [facility];  Service: Cardiovascular;  Laterality: N/A;   left partial knee replacement   2001   RIGHT/LEFT HEART CATH AND CORONARY ANGIOGRAPHY N/A 11/22/2021    Procedure: RIGHT/LEFT HEART CATH AND CORONARY ANGIOGRAPHY;  Surgeon: Iran Ouch, MD;  Location: [facility];  Service: Cardiovascular;  Laterality: N/A;   TOTAL KNEE ARTHROPLASTY Right 05/12/2014    Procedure: RIGHT TOTAL KNEE ARTHROPLASTY;  Surgeon: Loanne Drilling, MD;  Location: [facility];  Service: Orthopedics;  Laterality: Right;          Tobacco Use History  Social History        Tobacco Use  Smoking  Status Former   Current packs/day: 0.00   Average packs/day: 0.5 packs/day for 40.0 years (20.0 ttl pk-yrs)   Types: Cigarettes   Start date: 07/15/1980   Quit date: 07/15/2020   Years since quitting: 2.8  Smokeless Tobacco Never      Social History       Substance and Sexual Activity  Alcohol Use No      Social History         Socioeconomic History   Marital status: Widowed      Spouse name: Not on file   Number of children: 2   Years of education: Not on file   Highest education level: Not on file  Occupational History   Occupation: Retired Research officer, trade union  Tobacco Use   Smoking status: Former      Current packs/day: 0.00  Average packs/day: 0.5 packs/day for 40.0 years (20.0 ttl pk-yrs)      Types: Cigarettes      Start date: 07/15/1980      Quit date: 07/15/2020      Years since quitting: 2.8   Smokeless tobacco: Never  Substance and Sexual Activity   Alcohol use: No   Drug use: No   Sexual activity: Not on file  Other Topics Concern   Not on file  Social History Narrative   Not on file    Social Determinants of Health        Financial Resource Strain: Not on file  Food Insecurity: Not on file  Transportation Needs: Not on file  Physical Activity: Not on file  Stress: Not on file  Social Connections: Unknown (11/22/2021)    Received [facility]    Social Network     Social Network: Not on file  Intimate Partner Violence: Unknown (10/20/2021)    Received from [facility]    HITS     Physically Hurt: Not on file     Insult or Talk Down To: Not on file     Threaten Physical Harm: Not on file     Scream or Curse: Not on file      Allergies  No Known Allergies           Current Outpatient Medications  Medication Sig Dispense Refill   acetaminophen (TYLENOL) 325 MG tablet Take 2 tablets (650 mg total) by mouth every 6 (six) hours as needed for mild pain (or Fever >/= 101). 40 tablet 0   aspirin EC  81 MG tablet Take 81 mg by mouth daily. Swallow whole.       atorvastatin (LIPITOR) 80 MG tablet Take 1 tablet (80 mg total) by mouth daily. 30 tablet 2   carvedilol (COREG) 12.5 MG tablet Take 6.25 mg by mouth 2 (two) times daily with a meal.       Cholecalciferol 50 MCG (2000 UT) TABS Take 1 tablet by mouth daily.       clopidogrel (PLAVIX) 75 MG tablet Take 1 tablet (75 mg total) by mouth daily with breakfast. 30 tablet 3   diclofenac Sodium (VOLTAREN) 1 % GEL APPLY 2 GRAMS TO AFFECTED AREA THREE TIMES A DAY       glimepiride (AMARYL) 2 MG tablet TAKE ONE TABLET BY MOUTH EVERY MORNING FOR DIABETES       latanoprost (XALATAN) 0.005 % ophthalmic solution Place 1 drop into both eyes at bedtime.       lidocaine (LIDODERM) 5 % APPLY 1 PATCH TO SKIN AS DIRECTED BY YOUR MEDICAL PROVIDER AS NEEDED (APPLY FOR 12 HOURS, THEN REMOVE FOR 12 HOURS) PLACE 12 HOURS ON AND 12 HOURS OFF       lisinopril-hydrochlorothiazide (ZESTORETIC) 10-12.5 MG tablet Take 1 tablet by mouth daily. 90 tablet 3   metFORMIN (GLUCOPHAGE-XR) 500 MG 24 hr tablet TAKE TWO TABLETS BY MOUTH BEFORE BREAKFAST AND TAKE ONE TABLET AT BEDTIME FOR DIABETES (ANNUAL KIDNEY FUNCTION TESTING IS NEEDED)       methocarbamol (ROBAXIN) 750 MG tablet TAKE ONE TABLET BY MOUTH THREE TIMES A DAY AS NEEDED *MAY CAUSE SLEEPINESS; DO NOT DRIVE AFTER TAKING IT 30 tablet 0   nitroGLYCERIN (NITROSTAT) 0.4 MG SL tablet Place 1 tablet (0.4 mg total) under the tongue every 5 (five) minutes as needed for chest pain. 25 tablet 1   traZODone (DESYREL) 100 MG tablet Take 1 tablet by  mouth at bedtime.       vitamin B-12 (CYANOCOBALAMIN) 500 MCG tablet Take 2 tablets by mouth daily.          No current facility-administered medications for this visit.               Family History  Problem Relation Age of Onset   Breast cancer Sister          both sisters   Lung cancer Brother          both brothers                Physical Exam: Teeth in good  repair Walks with cane Lungs: clear Cardiac: rr with 2/6 sem Ext: no edema Neuro: intact         Diagnostic Studies & Laboratory data: I have personally reviewed the following studies and agree with the findings   TTE (19/2024) IMPRESSIONS     1. Left ventricular ejection fraction, by estimation, is 60 to 65%. Left  ventricular ejection fraction by 3D volume is 60 %. The left ventricle has  normal function. The left ventricle has no regional wall motion  abnormalities. There is moderate  concentric left ventricular hypertrophy. Left ventricular diastolic  parameters are consistent with Grade I diastolic dysfunction (impaired  relaxation).   2. Right ventricular systolic function is normal. The right ventricular  size is normal. There is normal pulmonary artery systolic pressure.   3. The mitral valve is degenerative. Mild mitral valve regurgitation. No  evidence of mitral stenosis.   4. The aortic valve was not well visualized. Aortic valve regurgitation  is trivial. Severe aortic valve stenosis. Aortic valve area, by VTI  measures 0.85 cm. Aortic valve mean gradient measures 40.0 mmHg. Aortic  valve Vmax measures 4.09 m/s.   5. Aortic dilatation noted. There is borderline dilatation of the  ascending aorta, measuring 42 mm.   6. The inferior vena cava is normal in size with greater than 50%  respiratory variability, suggesting right atrial pressure of 3 mmHg.   FINDINGS   Left Ventricle: Left ventricular ejection fraction, by estimation, is 60  to 65%. Left ventricular ejection fraction by 3D volume is 60 %. The left  ventricle has normal function. The left ventricle has no regional wall  motion abnormalities. The left  ventricular internal cavity size was normal in size. There is moderate  concentric left ventricular hypertrophy. Left ventricular diastolic  parameters are consistent with Grade I diastolic dysfunction (impaired  relaxation).   Right Ventricle: The right  ventricular size is normal. No increase in  right ventricular wall thickness. Right ventricular systolic function is  normal. There is normal pulmonary artery systolic pressure. The tricuspid  regurgitant velocity is 2.03 m/s, and   with an assumed right atrial pressure of 3 mmHg, the estimated right  ventricular systolic pressure is 19.5 mmHg.   Left Atrium: Left atrial size was normal in size.   Right Atrium: Right atrial size was normal in size.   Pericardium: There is no evidence of pericardial effusion. Presence of  epicardial fat layer.   Mitral Valve: The mitral valve is degenerative in appearance. Mild mitral  valve regurgitation. No evidence of mitral valve stenosis.   Tricuspid Valve: The tricuspid valve is normal in structure. Tricuspid  valve regurgitation is trivial. No evidence of tricuspid stenosis.   Aortic Valve: The aortic valve was not well visualized. Aortic valve  regurgitation is trivial. Severe aortic stenosis is present. Aortic valve  mean gradient measures 40.0 mmHg. Aortic valve peak gradient measures 66.9  mmHg. Aortic valve area, by VTI  measures 0.85 cm.   Pulmonic Valve: The pulmonic valve was normal in structure. Pulmonic valve  regurgitation is trivial. No evidence of pulmonic stenosis.   Aorta: Aortic dilatation noted. There is borderline dilatation of the  ascending aorta, measuring 42 mm.   Venous: The inferior vena cava is normal in size with greater than 50%  respiratory variability, suggesting right atrial pressure of 3 mmHg.   IAS/Shunts: No atrial level shunt detected by color flow Doppler.     LEFT VENTRICLE  PLAX 2D  LVIDd:         4.10 cm         Diastology  LVIDs:         2.70 cm         LV e' medial:    5.48 cm/s  LV PW:         1.30 cm         LV E/e' medial:  6.5  LV IVS:        1.40 cm         LV e' lateral:   4.13 cm/s  LVOT diam:     2.10 cm         LV E/e' lateral: 8.6  LV SV:         82  LV SV Index:   34  LVOT Area:      3.46 cm        3D Volume EF                                 LV 3D EF:    Left                                              ventricul                                              ar                                              ejection                                              fraction                                              by 3D                                              volume is  60 %.                                   3D Volume EF:                                 3D EF:        60 %                                 LV EDV:       190 ml                                 LV ESV:       77 ml                                 LV SV:        113 ml   RIGHT VENTRICLE  RV Basal diam:  3.50 cm  RV Mid diam:    3.20 cm  RV S prime:     12.20 cm/s  TAPSE (M-mode): 2.1 cm   LEFT ATRIUM             Index        RIGHT ATRIUM           Index  LA diam:        4.40 cm 1.83 cm/m   RA Area:     14.40 cm  LA Vol (A2C):   42.2 ml 17.52 ml/m  RA Volume:   28.60 ml  11.87 ml/m  LA Vol (A4C):   36.4 ml 15.11 ml/m  LA Biplane Vol: 39.3 ml 16.32 ml/m   AORTIC VALVE  AV Area (Vmax):    0.71 cm  AV Area (Vmean):   0.67 cm  AV Area (VTI):     0.85 cm  AV Vmax:           409.00 cm/s  AV Vmean:          289.000 cm/s  AV VTI:            0.973 m  AV Peak Grad:      66.9 mmHg  AV Mean Grad:      40.0 mmHg  LVOT Vmax:         83.80 cm/s  LVOT Vmean:        56.100 cm/s  LVOT VTI:          0.238 m  LVOT/AV VTI ratio: 0.24    AORTA  Ao Root diam: 3.60 cm  Ao Asc diam:  4.20 cm   MITRAL VALVE               TRICUSPID VALVE  MV Area (PHT): 2.16 cm    TR Peak grad:   16.5 mmHg  MV Decel Time: 352 msec    TR Vmax:        203.00 cm/s  MV E velocity: 35.60 cm/s  MV A velocity: 64.50 cm/s  SHUNTS  MV E/A ratio:  0.55        Systemic VTI:  0.24 m  Systemic Diam: 2.10 cm    CATH (11/2021) Conclusion       Mid LAD-1  lesion is 95% stenosed.   Mid LAD-2 lesion is 70% stenosed.   2nd Diag lesion is 60% stenosed.   A drug-eluting stent was successfully placed using a SYNERGY XD 3.0X48.   Post intervention, there is a 0% residual stenosis.   Post intervention, there is a 0% residual stenosis.   There is mild left ventricular systolic dysfunction.   LV end diastolic pressure is moderately elevated.   The left ventricular ejection fraction is 45-50% by visual estimate.   There is severe aortic valve stenosis.   1.  Left dominant coronary arteries with severe hazy stenosis in the mid LAD which is the likely culprit for non-ST elevation myocardial infarction. 2.  Right heart catheterization showed normal filling pressures, minimal pulmonary hypertension and normal cardiac output. 3.  Severely calcified aortic valve with restricted opening.  There is evidence of severe aortic stenosis with mean gradient of 56 mmHg and valve area of 0.87 cm. 4.  Successful angioplasty and drug-eluting stent placement to the mid LAD.  A long stent was used to cover the second lesion in the mid LAD.  The second diagonal was jailed by the stent but had normal flow and no significant ostial stenosis.   Recommendations: Dual antiplatelet therapy for at least 12 months. Aggressive treatment of risk factors. Evaluate for TAVR.    Recent Radiology Findings:   CTA (12/2021) FINDINGS: Aortic Valve:   Tricuspid aortic valve. Severely reduced cusp separation. Severely thickened, severely calcified aortic valve cusps.   AV calcium score: 4555   Virtual Basal Annulus Measurements:   Maximum/Minimum Diameter: 28.5 x 23.8 mm   Perimeter: 80.9 mm   Area:  505 mm2   Mild LVOT calcifications.   Membranous septal length: 4.2 mm   Based on these measurements, the annulus would be suitable for a 26 mm Sapien 3 valve.   Sinus of Valsalva Measurements:   Non-coronary:  36 mm   Right - coronary:  33 mm   Left - coronary:  36  mm   Sinus of Valsalva Height:   Left: 31.7 mm   Right: 34.5 mm   Aorta: Conventional 3 vessel branch pattern of aortic arch.   Sinotubular Junction:  35 mm   Ascending Thoracic Aorta:  39 mm   Aortic Arch:  30 mm   Descending Thoracic Aorta:  31 mm   Coronary Artery Height above Annulus:   Left main: 26.1 mm   Right coronary: 19.8 mm   Coronary Arteries: Normal coronary origin. Left dominance. The study was performed without use of NTG and insufficient for plaque evaluation.   Optimum Fluoroscopic Angle for Delivery: LAO 8 CAU 6   OTHER:   Atria:   Left atrial appendage: No thrombus.   Mitral valve: Grossly normal, mild mitral annular calcifications.   Pulmonary veins: Normal anatomy.   IMPRESSION: 1. Tricuspid aortic valve with severely reduced cusp excursion. Severely thickened and severely calcified aortic valve cusps.   2.  Aortic valve calcium score: 4555   3. Annulus area: 505 mm2, suitable for 26 mm Sapien 3 valve. Mild LVOT calcifications. Membranous septal length 4.2 mm.   4.  Sufficient coronary artery heights from annulus.   5.  Optimum fluoroscopic angle for delivery: LAO 8 CAU 6   6. Ascending aorta measures 39 mm, borderline measurement likely upper limit of normal.     Electronically Signed   By:  Weston Brass M.D.   On: 12/27/2021 06:02    Addended by Parke Poisson, MD on 12/27/2021  6:04 AM    Study Result   Narrative & Impression  EXAM: OVER-READ INTERPRETATION  CT CHEST   The following report is a limited chest CT over-read performed by radiologist Dr. Allegra Lai of [facility] on 12/25/2021. This over-read does not include interpretation of cardiac or coronary anatomy or pathology. The TAVR interpretation by the cardiologist is attached.   COMPARISON:  None Available.   FINDINGS: Extracardiac findings will be described separately under dictation for contemporaneously obtained CTA chest, abdomen  and pelvis.   IMPRESSION: Please see separate dictation for contemporaneously obtained CTA chest, abdomen and pelvis dated 12/25/2021 for full description of relevant extracardiac findings.            Recent Lab Findings: Recent Labs       Lab Results  Component Value Date    WBC 5.2 11/23/2021    HGB 11.8 (L) 11/23/2021    HCT 35.3 (L) 11/23/2021    PLT 86 (L) 11/23/2021    GLUCOSE 165 (H) 07/04/2022    CHOL 98 (L) 12/14/2022    TRIG 106 12/14/2022    HDL 99 12/14/2022    LDLDIRECT 31 12/18/2022    LDLCALC Comment (A) 12/14/2022    ALT 68 (H) 12/14/2022    AST 43 (H) 12/14/2022    NA 143 07/04/2022    K 4.0 07/04/2022    CL 104 07/04/2022    CREATININE 1.20 07/04/2022    BUN 13 07/04/2022    CO2 23 07/04/2022    INR 1.09 05/05/2014    HGBA1C 6.7 (H) 11/22/2021            Assessment / Plan:     Pt is an 82 yr old male with NYHA class 2 symptoms of severe AS with normal LV function with previous LAD stenting. Pt has no cp and has had TAVR work up in past with acceptable anatomy for a 23mm Sapien valve with femoral access. Pt has not had recent cath or CTA however without CP would be reasonable not to repeat cath. We discussed the risks and goals of surgery and he wishes to proceed with TAVR. He would be a candidate for bailout if issues arise with TAVR.

## 2023-06-04 ENCOUNTER — Inpatient Hospital Stay (HOSPITAL_COMMUNITY)
Admission: RE | Admit: 2023-06-04 | Discharge: 2023-06-05 | DRG: 267 | Disposition: A | Discharge status: Home / Self Care | Payer: No Typology Code available for payment source | Attending: Cardiovascular Disease | Admitting: Cardiovascular Disease

## 2023-06-04 ENCOUNTER — Inpatient Hospital Stay (HOSPITAL_COMMUNITY): Payer: No Typology Code available for payment source

## 2023-06-04 ENCOUNTER — Encounter (HOSPITAL_COMMUNITY): Payer: Self-pay | Admitting: Cardiovascular Disease

## 2023-06-04 ENCOUNTER — Other Ambulatory Visit: Payer: Self-pay

## 2023-06-04 ENCOUNTER — Inpatient Hospital Stay (HOSPITAL_COMMUNITY): Payer: No Typology Code available for payment source | Admitting: Physician Assistant

## 2023-06-04 ENCOUNTER — Encounter (HOSPITAL_COMMUNITY)
Admission: RE | Disposition: A | Discharge status: Home / Self Care | Payer: Self-pay | Source: Home / Self Care | Attending: Cardiovascular Disease

## 2023-06-04 ENCOUNTER — Other Ambulatory Visit (HOSPITAL_COMMUNITY): Payer: Self-pay

## 2023-06-04 ENCOUNTER — Other Ambulatory Visit: Payer: Self-pay | Admitting: Physician Assistant

## 2023-06-04 DIAGNOSIS — N183 Chronic kidney disease, stage 3 unspecified: Secondary | ICD-10-CM | POA: Diagnosis present

## 2023-06-04 DIAGNOSIS — I129 Hypertensive chronic kidney disease with stage 1 through stage 4 chronic kidney disease, or unspecified chronic kidney disease: Secondary | ICD-10-CM | POA: Diagnosis present

## 2023-06-04 DIAGNOSIS — I350 Nonrheumatic aortic (valve) stenosis: Secondary | ICD-10-CM | POA: Diagnosis present

## 2023-06-04 DIAGNOSIS — Z955 Presence of coronary angioplasty implant and graft: Secondary | ICD-10-CM

## 2023-06-04 DIAGNOSIS — I252 Old myocardial infarction: Secondary | ICD-10-CM

## 2023-06-04 DIAGNOSIS — Z006 Encounter for examination for normal comparison and control in clinical research program: Secondary | ICD-10-CM

## 2023-06-04 DIAGNOSIS — Z7984 Long term (current) use of oral hypoglycemic drugs: Secondary | ICD-10-CM

## 2023-06-04 DIAGNOSIS — I2510 Atherosclerotic heart disease of native coronary artery without angina pectoris: Secondary | ICD-10-CM

## 2023-06-04 DIAGNOSIS — Z96653 Presence of artificial knee joint, bilateral: Secondary | ICD-10-CM | POA: Diagnosis present

## 2023-06-04 DIAGNOSIS — Z79899 Other long term (current) drug therapy: Secondary | ICD-10-CM

## 2023-06-04 DIAGNOSIS — Z7902 Long term (current) use of antithrombotics/antiplatelets: Secondary | ICD-10-CM

## 2023-06-04 DIAGNOSIS — Z87891 Personal history of nicotine dependence: Secondary | ICD-10-CM | POA: Diagnosis not present

## 2023-06-04 DIAGNOSIS — I712 Thoracic aortic aneurysm, without rupture, unspecified: Secondary | ICD-10-CM | POA: Diagnosis present

## 2023-06-04 DIAGNOSIS — I10 Essential (primary) hypertension: Secondary | ICD-10-CM | POA: Diagnosis present

## 2023-06-04 DIAGNOSIS — E1122 Type 2 diabetes mellitus with diabetic chronic kidney disease: Secondary | ICD-10-CM | POA: Diagnosis present

## 2023-06-04 DIAGNOSIS — Z7982 Long term (current) use of aspirin: Secondary | ICD-10-CM

## 2023-06-04 DIAGNOSIS — R918 Other nonspecific abnormal finding of lung field: Secondary | ICD-10-CM | POA: Diagnosis present

## 2023-06-04 DIAGNOSIS — Z952 Presence of prosthetic heart valve: Secondary | ICD-10-CM | POA: Diagnosis not present

## 2023-06-04 DIAGNOSIS — I214 Non-ST elevation (NSTEMI) myocardial infarction: Secondary | ICD-10-CM | POA: Diagnosis present

## 2023-06-04 DIAGNOSIS — E785 Hyperlipidemia, unspecified: Secondary | ICD-10-CM | POA: Diagnosis present

## 2023-06-04 DIAGNOSIS — H9190 Unspecified hearing loss, unspecified ear: Secondary | ICD-10-CM | POA: Diagnosis present

## 2023-06-04 DIAGNOSIS — K862 Cyst of pancreas: Secondary | ICD-10-CM | POA: Diagnosis present

## 2023-06-04 DIAGNOSIS — I714 Abdominal aortic aneurysm, without rupture, unspecified: Secondary | ICD-10-CM | POA: Diagnosis present

## 2023-06-04 DIAGNOSIS — D696 Thrombocytopenia, unspecified: Secondary | ICD-10-CM | POA: Diagnosis present

## 2023-06-04 DIAGNOSIS — E118 Type 2 diabetes mellitus with unspecified complications: Secondary | ICD-10-CM | POA: Diagnosis present

## 2023-06-04 HISTORY — DX: Nonrheumatic aortic (valve) stenosis: I35.0

## 2023-06-04 HISTORY — DX: Presence of prosthetic heart valve: Z95.2

## 2023-06-04 HISTORY — PX: INTRAOPERATIVE TRANSTHORACIC ECHOCARDIOGRAM: SHX6523

## 2023-06-04 LAB — GLUCOSE, CAPILLARY
Glucose-Capillary: 158 mg/dL — ABNORMAL HIGH (ref 70–99)
Glucose-Capillary: 123 mg/dL — ABNORMAL HIGH (ref 70–99)
Glucose-Capillary: 124 mg/dL — ABNORMAL HIGH (ref 70–99)
Glucose-Capillary: 181 mg/dL — ABNORMAL HIGH (ref 70–99)

## 2023-06-04 LAB — POCT I-STAT, CHEM 8
BUN: 15 mg/dL (ref 8–23)
BUN: 16 mg/dL (ref 8–23)
Calcium, Ion: 1.13 mmol/L — ABNORMAL LOW (ref 1.15–1.40)
Calcium, Ion: 1.21 mmol/L (ref 1.15–1.40)
Chloride: 104 mmol/L (ref 98–111)
Chloride: 104 mmol/L (ref 98–111)
Creatinine, Ser: 1.1 mg/dL (ref 0.61–1.24)
Creatinine, Ser: 1.2 mg/dL (ref 0.61–1.24)
Glucose, Bld: 140 mg/dL — ABNORMAL HIGH (ref 70–99)
Glucose, Bld: 145 mg/dL — ABNORMAL HIGH (ref 70–99)
HCT: 35 % — ABNORMAL LOW (ref 39.0–52.0)
HCT: 36 % — ABNORMAL LOW (ref 39.0–52.0)
Hemoglobin: 11.9 g/dL — ABNORMAL LOW (ref 13.0–17.0)
Hemoglobin: 12.2 g/dL — ABNORMAL LOW (ref 13.0–17.0)
Potassium: 3.9 mmol/L (ref 3.5–5.1)
Potassium: 4 mmol/L (ref 3.5–5.1)
Sodium: 142 mmol/L (ref 135–145)
Sodium: 142 mmol/L (ref 135–145)
TCO2: 24 mmol/L (ref 22–32)
TCO2: 28 mmol/L (ref 22–32)

## 2023-06-04 LAB — ECHOCARDIOGRAM LIMITED
AV Mean grad: 22.8 mm[Hg]
AV Peak grad: 37.4 mm[Hg]
Ao pk vel: 3.06 m/s

## 2023-06-04 SURGERY — TRANSCATHETER AORTIC VALVE REPLACEMENT, TRANSFEMORAL (CATHLAB)
Anesthesia: Monitor Anesthesia Care

## 2023-06-04 MED ORDER — SODIUM CHLORIDE 0.9% FLUSH
3.0000 mL | Freq: Two times a day (BID) | INTRAVENOUS | Status: DC
  Administered 2023-06-05: 3 mL via INTRAVENOUS

## 2023-06-04 MED ORDER — SODIUM CHLORIDE 0.9% FLUSH: 3.0000 mL | INTRAVENOUS | Status: DC | PRN

## 2023-06-04 MED ORDER — DEXTROSE 5 % IV SOLN
INTRAVENOUS | Status: DC | PRN
  Administered 2023-06-04: 3 g via INTRAVENOUS

## 2023-06-04 MED ORDER — CEFAZOLIN SODIUM-DEXTROSE 2-4 GM/100ML-% IV SOLN
2.0000 g | Freq: Three times a day (TID) | INTRAVENOUS | Status: AC
  Administered 2023-06-04 – 2023-06-05 (×2): 2 g via INTRAVENOUS
  Filled 2023-06-04 (×2): qty 100

## 2023-06-04 MED ORDER — IODIXANOL 320 MG/ML IV SOLN
INTRAVENOUS | Status: DC | PRN
  Administered 2023-06-04: 60 mL via INTRA_ARTERIAL

## 2023-06-04 MED ORDER — NOREPINEPHRINE BITARTRATE 1 MG/ML IV SOLN
INTRAVENOUS | Status: DC | PRN
  Administered 2023-06-04 (×2): .5 mL via INTRAVENOUS

## 2023-06-04 MED ORDER — CHLORHEXIDINE GLUCONATE 0.12 % MT SOLN
15.0000 mL | Freq: Once | OROMUCOSAL | Status: AC
Start: 2023-06-05 — End: 2023-06-04
  Filled 2023-06-04: qty 15

## 2023-06-04 MED ORDER — LIDOCAINE HCL (PF) 1 % IJ SOLN
INTRAMUSCULAR | Status: DC | PRN
  Administered 2023-06-04 (×2): 12 mL

## 2023-06-04 MED ORDER — CHLORHEXIDINE GLUCONATE 4 % EX SOLN
60.0000 mL | Freq: Once | CUTANEOUS | Status: DC
  Filled 2023-06-04: qty 60

## 2023-06-04 MED ORDER — PROPOFOL 10 MG/ML IV BOLUS
INTRAVENOUS | Status: DC | PRN
  Administered 2023-06-04 (×2): 10 mg via INTRAVENOUS

## 2023-06-04 MED ORDER — HEPARIN (PORCINE) IN NACL 1000-0.9 UT/500ML-% IV SOLN
INTRAVENOUS | Status: DC | PRN
  Administered 2023-06-04 (×2): 500 mL

## 2023-06-04 MED ORDER — ACETAMINOPHEN 325 MG PO TABS
650.0000 mg | ORAL_TABLET | Freq: Four times a day (QID) | ORAL | Status: DC | PRN
  Administered 2023-06-04: 650 mg via ORAL
  Filled 2023-06-04: qty 2

## 2023-06-04 MED ORDER — ATORVASTATIN CALCIUM 80 MG PO TABS
80.0000 mg | ORAL_TABLET | Freq: Every day | ORAL | Status: DC
  Administered 2023-06-04: 80 mg via ORAL
  Filled 2023-06-04 (×2): qty 1

## 2023-06-04 MED ORDER — ASPIRIN 81 MG PO TBEC
81.0000 mg | DELAYED_RELEASE_TABLET | Freq: Every day | ORAL | Status: DC
  Administered 2023-06-04 – 2023-06-05 (×2): 81 mg via ORAL
  Filled 2023-06-04 (×2): qty 1

## 2023-06-04 MED ORDER — NITROGLYCERIN IN D5W 200-5 MCG/ML-% IV SOLN
0.0000 ug/min | INTRAVENOUS | Status: DC
  Filled 2023-06-04: qty 250

## 2023-06-04 MED ORDER — CHLORHEXIDINE GLUCONATE 0.12 % MT SOLN
OROMUCOSAL | Status: AC
  Administered 2023-06-04: 15 mL via OROMUCOSAL
  Filled 2023-06-04: qty 15

## 2023-06-04 MED ORDER — HYDROCHLOROTHIAZIDE 12.5 MG PO TABS
12.5000 mg | ORAL_TABLET | Freq: Every day | ORAL | Status: DC
  Administered 2023-06-05: 12.5 mg via ORAL
  Filled 2023-06-04: qty 1

## 2023-06-04 MED ORDER — INSULIN ASPART 100 UNIT/ML IJ SOLN
0.0000 [IU] | Freq: Three times a day (TID) | INTRAMUSCULAR | Status: DC
  Administered 2023-06-04: 4 [IU] via SUBCUTANEOUS
  Administered 2023-06-05: 2 [IU] via SUBCUTANEOUS

## 2023-06-04 MED ORDER — EPINEPHRINE 1 MG/10ML IJ SOSY
PREFILLED_SYRINGE | INTRAMUSCULAR | Status: DC | PRN
  Administered 2023-06-04: 10 ug via INTRAVENOUS

## 2023-06-04 MED ORDER — SODIUM CHLORIDE 0.9 % IV SOLN: 250.0000 mL | INTRAVENOUS | Status: DC | PRN

## 2023-06-04 MED ORDER — CHLORHEXIDINE GLUCONATE 4 % EX SOLN
60.0000 mL | Freq: Once | CUTANEOUS | Status: DC
Start: 2023-06-05 — End: 2023-06-04
  Filled 2023-06-04: qty 60

## 2023-06-04 MED ORDER — SODIUM CHLORIDE 0.9 % IV SOLN: INTRAVENOUS | Status: AC

## 2023-06-04 MED ORDER — CHLORHEXIDINE GLUCONATE 4 % EX SOLN
30.0000 mL | CUTANEOUS | Status: DC
Start: 2023-06-04 — End: 2023-06-04
  Filled 2023-06-04: qty 30

## 2023-06-04 MED ORDER — TRAZODONE HCL 50 MG PO TABS
50.0000 mg | ORAL_TABLET | Freq: Every evening | ORAL | Status: DC | PRN
  Administered 2023-06-05: 50 mg via ORAL
  Filled 2023-06-04: qty 1

## 2023-06-04 MED ORDER — TRAMADOL HCL 50 MG PO TABS: 50.0000 mg | ORAL_TABLET | ORAL | Status: DC | PRN

## 2023-06-04 MED ORDER — MORPHINE SULFATE (PF) 2 MG/ML IV SOLN: 1.0000 mg | INTRAVENOUS | Status: DC | PRN

## 2023-06-04 MED ORDER — LISINOPRIL 10 MG PO TABS
10.0000 mg | ORAL_TABLET | Freq: Every day | ORAL | Status: DC
  Administered 2023-06-05: 10 mg via ORAL
  Filled 2023-06-04: qty 1

## 2023-06-04 MED ORDER — LACTATED RINGERS IV SOLN: INTRAVENOUS | Status: DC

## 2023-06-04 MED ORDER — CLOPIDOGREL BISULFATE 75 MG PO TABS
75.0000 mg | ORAL_TABLET | Freq: Every day | ORAL | Status: DC
  Administered 2023-06-05: 75 mg via ORAL
  Filled 2023-06-04: qty 1

## 2023-06-04 MED ORDER — ACETAMINOPHEN 650 MG RE SUPP: 650.0000 mg | Freq: Four times a day (QID) | RECTAL | Status: DC | PRN

## 2023-06-04 MED ORDER — ONDANSETRON HCL 4 MG/2ML IJ SOLN: 4.0000 mg | Freq: Four times a day (QID) | INTRAMUSCULAR | Status: DC | PRN

## 2023-06-04 MED ORDER — LISINOPRIL-HYDROCHLOROTHIAZIDE 10-12.5 MG PO TABS: 1.0000 | ORAL_TABLET | Freq: Every day | ORAL | Status: DC

## 2023-06-04 MED ORDER — SODIUM CHLORIDE 0.9 % IV SOLN: INTRAVENOUS | Status: DC

## 2023-06-04 MED ORDER — HEPARIN SODIUM (PORCINE) 1000 UNIT/ML IJ SOLN
INTRAMUSCULAR | Status: DC | PRN
  Administered 2023-06-04: 18550 [IU] via INTRAVENOUS

## 2023-06-04 MED ORDER — INSULIN ASPART 100 UNIT/ML IJ SOLN: 0.0000 [IU] | INTRAMUSCULAR | Status: DC | PRN

## 2023-06-04 MED ORDER — OXYCODONE HCL 5 MG PO TABS: 5.0000 mg | ORAL_TABLET | ORAL | Status: DC | PRN

## 2023-06-04 MED ORDER — PROTAMINE SULFATE 10 MG/ML IV SOLN
INTRAVENOUS | Status: AC
  Filled 2023-06-04: qty 5

## 2023-06-04 MED ORDER — PROTAMINE SULFATE 10 MG/ML IV SOLN
INTRAVENOUS | Status: DC | PRN
  Administered 2023-06-04 (×2): 10 mg via INTRAVENOUS

## 2023-06-04 SURGICAL SUPPLY — 31 items
BAG SNAP BAND KOVER 36X36 (MISCELLANEOUS) ×2 IMPLANT
CABLE ADAPT PACING TEMP 12FT (ADAPTER) IMPLANT
CATH 26 ULTRA DELIVERY (CATHETERS) IMPLANT
CATH DIAG 6FR PIGTAIL ANGLED (CATHETERS) IMPLANT
CATH INFINITI 5 FR JR5 (CATHETERS) IMPLANT
CATH INFINITI 5FR ANG PIGTAIL (CATHETERS) IMPLANT
CATH INFINITI 6F AL1 (CATHETERS) IMPLANT
CATH INFINITI 6F AL2 (CATHETERS) IMPLANT
CATH INFINITI JR4 5F (CATHETERS) IMPLANT
CATH S G BIP PACING (CATHETERS) IMPLANT
CLOSURE MYNX CONTROL 6F/7F (Vascular Products) IMPLANT
CLOSURE PERCLOSE PROSTYLE (VASCULAR PRODUCTS) IMPLANT
CRIMPER (MISCELLANEOUS) IMPLANT
DEVICE INFLATION ATRION QL2530 (MISCELLANEOUS) IMPLANT
ELECT DEFIB PAD ADLT CADENCE (PAD) IMPLANT
KIT MICROPUNCTURE NIT STIFF (SHEATH) IMPLANT
KIT SAPIAN 3 ULTRA RESILIA 26 (Valve) IMPLANT
PACK CARDIAC CATHETERIZATION (CUSTOM PROCEDURE TRAY) ×1 IMPLANT
SET ATX-X65L (MISCELLANEOUS) IMPLANT
SHEATH BRITE TIP 7FR 35CM (SHEATH) IMPLANT
SHEATH INTRODUCER SET 20-26 (SHEATH) IMPLANT
SHEATH PINNACLE 6F 10CM (SHEATH) IMPLANT
SHEATH PINNACLE 8F 10CM (SHEATH) IMPLANT
SHEATH PROBE COVER 6X72 (BAG) IMPLANT
SHIELD CATH-GARD CONTAMINATION (MISCELLANEOUS) IMPLANT
STOPCOCK MORSE 400PSI 3WAY (MISCELLANEOUS) ×2 IMPLANT
WIRE AMPLATZ SS-J .035X260CM (WIRE) IMPLANT
WIRE EMERALD 3MM-J .035X150CM (WIRE) IMPLANT
WIRE EMERALD 3MM-J .035X260CM (WIRE) IMPLANT
WIRE EMERALD ST .035X260CM (WIRE) IMPLANT
WIRE SAFARI SM CURVE 275 (WIRE) IMPLANT

## 2023-06-04 NOTE — Progress Notes (Signed)
Left groin had slow ooze; manual pressure held for 40 minutes . No hematoma. Left dp palpableOozing stopped. Dressing applied. VSS.

## 2023-06-04 NOTE — Op Note (Signed)
[facility]   MULTIDISCIPLINARY HEART VALVE TEAM   TAVR OPERATIVE NOTE   Date of Procedure:  06/04/2023  Preoperative Diagnosis: Severe Aortic Stenosis   Postoperative Diagnosis: Same   Procedure:   Transcatheter Aortic Valve Replacement - Percutaneous left Transfemoral Approach  Edwards Sapien 3 Ultra THV (size 26 mm, model # 9755RSL, )   Co-Surgeons:  Eugenio Hoes MD and   Verne Carrow   Anesthesiologist:  Marcene Duos MD  Echocardiographer:  Thurmon Fair MD  Pre-operative Echo Findings: Severe aortic stenosis Normal left ventricular systolic function  Post-operative Echo Findings: no paravalvular leak normal left ventricular systolic function   BRIEF CLINICAL NOTE AND INDICATIONS FOR SURGERY  Pt is an 82 yr old male with NYHA class 2 symptoms of severe AS with normal LV function with previous LAD stenting. Pt has no cp and has had TAVR work up in past with acceptable anatomy for a 23mm Sapien valve with femoral access.     DETAILS OF THE OPERATIVE PROCEDURE  PREPARATION:    The patient was brought to the operating room on the above mentioned date and appropriate monitoring was established by the anesthesia team. The patient was placed in the supine position on the operating table.  Intravenous antibiotics were administered. The patient was monitored closely throughout the procedure under conscious sedation.  Baseline transthoracic echocardiogram was performed. The patient's abdomen and both groins were prepped and draped in a sterile manner. A time out procedure was performed.   PERIPHERAL ACCESS:    Using the modified Seldinger technique, femoral arterial and venous access was obtained with placement of 6 Fr sheaths on the right side.  A pigtail diagnostic catheter was passed through the right arterial sheath under fluoroscopic guidance into the aortic root.  A temporary transvenous pacemaker catheter was passed through the right  femoral venous sheath under fluoroscopic guidance into the right ventricle.  The pacemaker was tested to ensure stable lead placement and pacemaker capture. Aortic root angiography was performed in order to determine the optimal angiographic angle for valve deployment.   TRANSFEMORAL ACCESS:   Percutaneous transfemoral access and sheath placement was performed using ultrasound guidance.  The left common femoral artery was cannulated using a micropuncture needle and appropriate location was verified using hand injection angiogram.  A pair of Abbott Perclose percutaneous closure devices were placed and a 6 French sheath replaced into the femoral artery.  The patient was heparinized systemically and ACT verified > 250 seconds.    A 14 Fr transfemoral E-sheath was introduced into the left common femoral artery after progressively dilating over an Amplatz superstiff wire. An AR4 catheter was used to direct a straight-tip exchange length wire across the native aortic valve into the left ventricle. This was exchanged out for a pigtail catheter and position was confirmed in the LV apex. Simultaneous LV and Ao pressures were recorded.  The pigtail catheter was exchanged for a Safari wire in the LV apex.   BALLOON AORTIC VALVULOPLASTY:   Not performed   TRANSCATHETER HEART VALVE DEPLOYMENT:   An Edwards Sapien 3 Ultra transcatheter heart valve (size 26 mm) was prepared and crimped per manufacturer's guidelines, and the proper orientation of the valve is confirmed on the [employer] delivery system. The valve was advanced through the introducer sheath using normal technique until in an appropriate position in the abdominal aorta beyond the sheath tip. The balloon was then retracted and using the fine-tuning wheel was centered on the valve. The valve was then  advanced across the aortic arch using appropriate flexion of the catheter. The valve was carefully positioned across the aortic valve annulus. The  Commander catheter was retracted using normal technique. Once final position of the valve has been confirmed by angiographic assessment, the valve is deployed during rapid ventricular pacing to maintain systolic blood pressure < 50 mmHg and pulse pressure < 10 mmHg. The balloon inflation is held for >3 seconds after reaching full deployment volume. Once the balloon has fully deflated the balloon is retracted into the ascending aorta and valve function is assessed using echocardiography. There is felt to be no paravalvular leak and no central aortic insufficiency.  The patient's hemodynamic recovery following valve deployment is good.  The deployment balloon and guidewire are both removed.    PROCEDURE COMPLETION:   The sheath was removed and femoral artery closure performed.  Protamine was administered once femoral arterial repair was complete. The temporary pacemaker, pigtail catheter and femoral sheaths were removed with manual pressure used for venous hemostasis.  A Mynx femoral closure device was utilized following removal of the diagnostic sheath in the right femoral artery.  The patient tolerated the procedure well and is transported to the cath lab recovery area in stable condition. There were no immediate intraoperative complications. All sponge instrument and needle counts are verified correct at completion of the operation.   No blood products were administered during the operation.  The patient received a total of 30 mL of intravenous contrast during the procedure.   Eugenio Hoes, MD 06/04/2023 4:51 PM

## 2023-06-04 NOTE — CV Procedure (Signed)
HEART AND VASCULAR CENTER  TAVR OPERATIVE NOTE   Date of Procedure:  06/04/2023  Preoperative Diagnosis: Severe Aortic Stenosis   Postoperative Diagnosis: Same   Procedure:   Transcatheter Aortic Valve Replacement - Transfemoral Approach  Edwards Sapien 3 THV (size 26 mm, model # [id number], serial # [id number] )   Co-Surgeons:  Verne Carrow, MD and Eugenio Hoes , MD   Anesthesiologist:  Okey Dupre  Echocardiographer:  Croitoru  Pre-operative Echo Findings: Severe aortic stenosis Normal left ventricular systolic function  Post-operative Echo Findings: No paravalvular leak Normal left ventricular systolic function  BRIEF CLINICAL NOTE AND INDICATIONS FOR SURGERY   82 yo male with history of diabetes mellitus, former tobacco abuse, HTN, AAA, sleep apnea, CAD and severe aortic stenosis Echo 05/01/23 with LVEF=60-65%. Severe aortic stenosis with mean gradient 49 mmHg, 86.7 mmHg, AVA 0.85 cm2, SVI 34.   During the course of the patient's preoperative work up they have been evaluated comprehensively by a multidisciplinary team of specialists coordinated through the Multidisciplinary Heart Valve Clinic in the [facility].  They have been demonstrated to suffer from symptomatic severe aortic stenosis as noted above. The patient has been counseled extensively as to the relative risks and benefits of all options for the treatment of severe aortic stenosis including long term medical therapy, conventional surgery for aortic valve replacement, and transcatheter aortic valve replacement.  The patient has been independently evaluated by Dr. Leafy Ro with CT surgery and they are felt to be at high risk for conventional surgical aortic valve replacement. The surgeon indicated the patient would be a poor candidate for conventional surgery. Based upon review of all of the patient's preoperative diagnostic tests they are felt to be candidate for transcatheter aortic valve  replacement using the transfemoral approach as an alternative to high risk conventional surgery.    Following the decision to proceed with transcatheter aortic valve replacement, a discussion has been held regarding what types of management strategies would be attempted intraoperatively in the event of life-threatening complications, including whether or not the patient would be considered a candidate for the use of cardiopulmonary bypass and/or conversion to open sternotomy for attempted surgical intervention.  The patient has been advised of a variety of complications that might develop peculiar to this approach including but not limited to risks of death, stroke, paravalvular leak, aortic dissection or other major vascular complications, aortic annulus rupture, device embolization, cardiac rupture or perforation, acute myocardial infarction, arrhythmia, heart block or bradycardia requiring permanent pacemaker placement, congestive heart failure, respiratory failure, renal failure, pneumonia, infection, other late complications related to structural valve deterioration or migration, or other complications that might ultimately cause a temporary or permanent loss of functional independence or other long term morbidity.  The patient provides full informed consent for the procedure as described and all questions were answered preoperatively.    DETAILS OF THE OPERATIVE PROCEDURE  PREPARATION:   The patient is brought to the operating room on the above mentioned date and central monitoring was established by the anesthesia team including placement of a radial arterial line. The patient is placed in the supine position on the operating table.  Intravenous antibiotics are administered. Conscious sedation is used.   Baseline transthoracic echocardiogram was performed. The patient's chest, abdomen, both groins, and both lower extremities are prepared and draped in a sterile manner. A time out procedure is  performed.   PERIPHERAL ACCESS:   Using the modified Seldinger technique, femoral arterial and venous access were obtained  with placement of a 6 Fr sheath in the artery and a 7 Fr sheath in the vein on the right side using u/s guidance.  A pigtail diagnostic catheter was passed through the femoral arterial sheath under fluoroscopic guidance into the aortic root.  A temporary transvenous pacemaker catheter was passed through the femoral venous sheath under fluoroscopic guidance into the right ventricle.  The pacemaker was tested to ensure stable lead placement and pacemaker capture. Aortic root angiography was performed in order to determine the optimal angiographic angle for valve deployment.  TRANSFEMORAL ACCESS:  A micropuncture kit was used to gain access to the left femoral artery using u/s guidance. Position confirmed with angiography. Pre-closure with double ProGlide closure devices. The patient was heparinized systemically and ACT verified > 250 seconds.    A 14 Fr transfemoral E-sheath was introduced into the left femoral artery after progressively dilating over an Amplatz superstiff wire. An JR5 catheter was used to direct a straight-tip exchange length wire across the native aortic valve into the left ventricle. This was exchanged out for a pigtail catheter and position was confirmed in the LV apex. Simultaneous LV and Ao pressures were recorded.  The pigtail catheter was then exchanged for a Safari wire in the LV apex.   TRANSCATHETER HEART VALVE DEPLOYMENT:  An Edwards Sapien 3 THV (size 26 mm) was prepared and crimped per manufacturer's guidelines, and the proper orientation of the valve is confirmed on the [employer] delivery system. The valve was advanced through the introducer sheath using normal technique until in an appropriate position in the abdominal aorta beyond the sheath tip. The balloon was then retracted and using the fine-tuning wheel was centered on the valve. The valve  was then advanced across the aortic arch using appropriate flexion of the catheter. The valve was carefully positioned across the aortic valve annulus. The Commander catheter was retracted using normal technique. Once final position of the valve has been confirmed by angiographic assessment, the valve is deployed while temporarily holding ventilation and during rapid ventricular pacing to maintain systolic blood pressure < 50 mmHg and pulse pressure < 10 mmHg. The balloon inflation is held for >3 seconds after reaching full deployment volume. Once the balloon has fully deflated the balloon is retracted into the ascending aorta and valve function is assessed using TTE. There is felt to be no paravalvular leak and no central aortic insufficiency.  The patient's hemodynamic recovery following valve deployment is good.  The deployment balloon and guidewire are both removed. Echo demostrated acceptable post-procedural gradients, stable mitral valve function, and no AI.   PROCEDURE COMPLETION:  The sheath was then removed and closure devices were completed. Protamine was administered once femoral arterial repair was complete. The temporary pacemaker, pigtail catheters and femoral sheaths were removed with a Mynx closure device placed in the artery and manual pressure used for venous hemostasis.    The patient tolerated the procedure well and is transported to the surgical intensive care in stable condition. There were no immediate intraoperative complications. All sponge instrument and needle counts are verified correct at completion of the operation.   No blood products were administered during the operation.  The patient received a total of 60 mL of intravenous contrast during the procedure.  LVEDP:  Verne Carrow MD, [facility] 06/04/2023 4:31 PM

## 2023-06-04 NOTE — Discharge Instructions (Signed)

## 2023-06-04 NOTE — Interval H&P Note (Signed)
History and Physical Interval Note:  06/04/2023 1:14 PM  Kirk Roberts Sr.  has presented today for surgery, with the diagnosis of Severe Aortic Stenosis.  The various methods of treatment have been discussed with the patient and family. After consideration of risks, benefits and other options for treatment, the patient has consented to  Procedure(s): Transcatheter Aortic Valve Replacement, Transfemoral (N/A) INTRAOPERATIVE TRANSTHORACIC ECHOCARDIOGRAM (N/A) as a surgical intervention.  The patient's history has been reviewed, patient examined, no change in status, stable for surgery.  I have reviewed the patient's chart and labs.  Questions were answered to the patient's satisfaction.     Eugenio Hoes

## 2023-06-04 NOTE — Progress Notes (Signed)
  HEART AND VASCULAR CENTER   MULTIDISCIPLINARY HEART VALVE TEAM  Patient doing well s/p TAVR. He is hemodynamically stable. Groin sites stable. ECG has not been done yet but tele shows sinus brady with HR in 50s. Plan to transfer to from cath lab holding to 4E when bed available. Early ambulation after bedrest completed and hopeful discharge over the next 24-48 hours.   Cline Crock PA-C  [facility]  Pager [phone]

## 2023-06-04 NOTE — Transfer of Care (Signed)
Immediate Anesthesia Transfer of Care Note  Patient: Kirk Drought Sr.  Procedure(s) Performed: Transcatheter Aortic Valve Replacement, Transfemoral INTRAOPERATIVE TRANSTHORACIC ECHOCARDIOGRAM  Patient Location: PACU  Anesthesia Type:MAC  Level of Consciousness: awake, alert , and oriented  Airway & Oxygen Therapy: Patient Spontanous Breathing and Patient connected to nasal cannula oxygen  Post-op Assessment: Report given to RN, Post -op Vital signs reviewed and stable, and Patient moving all extremities  Post vital signs: Reviewed and stable  Last Vitals:  Vitals Value Taken Time  BP 119/76 06/04/23 1645  Temp 36.6 C 06/04/23 1647  Pulse 59 06/04/23 1650  Resp 16 06/04/23 1650  SpO2 98 % 06/04/23 1650  Vitals shown include unfiled device data.  Last Pain:  Vitals:   06/04/23 1647  TempSrc: Axillary  PainSc: 2       Patients Stated Pain Goal: 2 (06/04/23 1610)  Complications: There were no known notable events for this encounter.

## 2023-06-04 NOTE — Anesthesia Preprocedure Evaluation (Signed)
Anesthesia Evaluation  Patient identified by MRN, date of birth, ID band Patient awake    Reviewed: Allergy & Precautions, NPO status , Patient's Chart, lab work & pertinent test results  Airway Mallampati: II  TM Distance: >3 FB     Dental  (+) Dental Advisory Given   Pulmonary shortness of breath, sleep apnea , former smoker   breath sounds clear to auscultation       Cardiovascular hypertension, Pt. on medications and Pt. on home beta blockers + CAD and + Past MI  + Valvular Problems/Murmurs AS  Rhythm:Regular Rate:Normal + Systolic murmurs    Neuro/Psych negative neurological ROS     GI/Hepatic negative GI ROS, Neg liver ROS,,,  Endo/Other  diabetes, Type 2    Renal/GU CRFRenal disease     Musculoskeletal  (+) Arthritis ,    Abdominal   Peds  Hematology negative hematology ROS (+)   Anesthesia Other Findings   Reproductive/Obstetrics                             Anesthesia Physical Anesthesia Plan  ASA: 4  Anesthesia Plan: MAC   Post-op Pain Management:    Induction:   PONV Risk Score and Plan: 1 and Dexamethasone and Ondansetron  Airway Management Planned: Natural Airway and Simple Face Mask  Additional Equipment: Arterial line  Intra-op Plan:   Post-operative Plan:   Informed Consent: I have reviewed the patients History and Physical, chart, labs and discussed the procedure including the risks, benefits and alternatives for the proposed anesthesia with the patient or authorized representative who has indicated his/her understanding and acceptance.       Plan Discussed with: CRNA  Anesthesia Plan Comments:        Anesthesia Quick Evaluation

## 2023-06-04 NOTE — Discharge Summary (Incomplete)
HEART AND VASCULAR CENTER   MULTIDISCIPLINARY HEART VALVE TEAM  Discharge Summary    Patient ID: Kirk Roberts. MRN: [id number]; DOB: 08/17/1940  Admit date: 06/04/2023 Discharge date: 06/05/2023  Primary Care Provider: Howell Pringle, MD  Primary Cardiologist: Verne Carrow, MD /Dr. Leafy Ro, MD (TAVR)  Discharge Diagnoses    Principal Problem:   S/P TAVR (transcatheter aortic valve replacement) Active Problems:   AAA (abdominal aortic aneurysm) without rupture [facility])   Essential hypertension   Type 2 diabetes mellitus with complication (HCC)   CAD (coronary artery disease)   Hyperlipidemia LDL goal <70   CKD (chronic kidney disease) stage 3, GFR 30-59 ml/min (HCC)   Severe aortic stenosis  Allergies No Known Allergies  Diagnostic Studies/Procedures    HEART AND VASCULAR CENTER  TAVR OPERATIVE NOTE     Date of Procedure:                06/04/2023   Preoperative Diagnosis:      Severe Aortic Stenosis    Postoperative Diagnosis:    Same    Procedure:        Transcatheter Aortic Valve Replacement - Transfemoral Approach             Edwards Sapien 3 THV (size 26 mm, model # [id number], serial # [id number] )              Co-Surgeons:                        Verne Carrow, MD and Eugenio Hoes , MD    Anesthesiologist:                  Okey Dupre   Echocardiographer:              Croitoru   Pre-operative Echo Findings: Severe aortic stenosis Normal left ventricular systolic function   Post-operative Echo Findings: No paravalvular leak Normal left ventricular systolic function  _____________    Echo 06/05/23: completed but pending formal read at the time of discharge   History of Present Illness     Kirk Roberts. is a 82 y.o. male with a history of DM2, former tobacco use, thrombocytopenia, HTN, AAA, TAA, sleep apnea, CAD with NSTEMI with PCI to LAD 11/2021, and severe aortic stenosis who presented to [facility] on 06/04/2023 for planned TAVR.   Mr.  Knabe was initially seen by cardiology after presenting 11/2021 with dyspnea and left arm pain found to have a NSTEMI. LHC showed severe mLAD stenosis treated with PCI. Echocardiogram at that time showed normal LV function at 60-65% with severe aortic stenosis with a mean gradient of , AVA 0.92cm2. He was discharged on DAPT with ASA and Plavix. He was referred to the structural hear team for TAVR work up in the outpatient setting and underwent CT scanning, however, he felt much improved after PCI and it was elected to follow his aortic stenosis. He was last seen 04/2023 at which time he described more fatigue and dyspnea. Repeat echo 05/01/23 showed EF 60-65% with worsening AS with a mean gradient at , AVA 0.85cm2. It was decided to proceed with TAVR, which was set up for 06/04/23.  Hospital Course     Consultants: none   Severe AS: s/p successful TAVR with a 26 mm Edwards Sapien 3 THV via the TF approach on 06/04/23. Post operative echo completed but pending formal read. Groin sites are stable. ECG with NSR and no high grade heart block. Continued  on chronic aspirin 81mg  daily and Plavix 75mg  daily. Walked with cardiac rehab with no issues. Plan for discharge home today with close follow up in the outpatient setting.   CAD: s/p PCI to mLAD 11/2021 in the setting of NSTEMI. No chest pain. Continued on chronic aspirin 81mg  daily and Plavix 75mg  daily as well as atorvastatin 80mg  daily.   HTN: BP well controlled. Resumed on home Coreg 12.5mg  BID and lisinopril-HCT 10-12.5mg  daily.  DMT2: treated with SSI while admitted. Resume home meds at discharge. 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 PLT slightly decreased today at 72 which is not uncommon after TAVR. Will continue to monitor.   AAA: 4.5cm by [place] 12/2022. Continue surveillance monitoring.  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 Plan to continue to follow  over time.   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 Most recent CT in 07/2022 reported "stable bilateral ground-glass and solid nodules as above. Repeat CT is recommended every 2 years until 5 years of stability has been established." Given need to follow over time up to 5 years, he will be referred to the pulmonary nodule clinic in the outpatient setting.   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 Recommend follow up pre and post contrast MRI/MRCP or pancreatic protocol CT in 2 years. This is due in 11/2024 and will be discussed in the outpatient setting.   _____________  Discharge Vitals Blood pressure 131/69, pulse 70, temperature 97.7 F (36.5 C), temperature source Oral, resp. rate (!) 21, height 6\' 1"  (1.854 m), weight 123.8 kg, SpO2 93%.  Filed Weights   06/03/23 1100 06/04/23 0837  Weight: 126 kg 123.8 kg    GEN: Well nourished, well developed, in no acute distress, obese HEENT: normal Neck: no JVD or masses Cardiac: RRR; no murmurs, rubs, or gallops,no edema  Respiratory:  clear to auscultation bilaterally, normal work of breathing GI: soft, nontender, nondistended, + BS MS: no deformity or atrophy Skin: warm and dry, no rash  Groin sites clear without hematoma. Small area of ecchymosis on left groin.  Neuro:  Alert and Oriented x 3, Strength and sensation are intact Psych: euthymic mood, full affect   Disposition   Pt is being discharged home today in good condition.  Follow-up Plans & Appointments     Follow-up Information     Filbert Schilder, NP. Go on 06/10/2023.   Specialty: Cardiology Why: @ 11:20, please arrive at least 10 min early. Contact information: [address] [place] [address] [phone]                 Discharge Medications   Allergies as of 06/05/2023   No Known Allergies      Medication List     TAKE these medications     acetaminophen 325 MG tablet Commonly known as: TYLENOL Take 2 tablets (650 mg total) by mouth every 6 (six) hours as needed for mild pain (or Fever >/= 101). What changed: how much to take   aspirin EC 81 MG tablet Take 81 mg by mouth daily. Swallow whole.   atorvastatin 80 MG tablet Commonly known as: LIPITOR Take 1 tablet (80 mg total) by mouth daily.   buprenorphine 10 MCG/HR Ptwk Commonly known as: BUTRANS Place 1 patch onto the skin once a week.   carboxymethylcellulose 0.5 %  Soln Commonly known as: REFRESH PLUS Place 1 drop into both eyes 3 (three) times daily as needed (dry eye).   carvedilol 25 MG tablet Commonly known as: COREG Take 12.5 mg by mouth 2 (two) times daily with a meal.   Cholecalciferol 50 MCG (2000 UT) Tabs Take 2,000 Units by mouth daily.   clopidogrel 75 MG tablet Commonly known as: PLAVIX Take 1 tablet (75 mg total) by mouth daily with breakfast.   cyanocobalamin 500 MCG tablet Commonly known as: VITAMIN B12 Take 1,000 mcg by mouth daily.   diclofenac Sodium 1 % Gel Commonly known as: VOLTAREN Apply 4 g topically daily as needed (Pain).   glimepiride 2 MG tablet Commonly known as: AMARYL TAKE ONE TABLET BY MOUTH EVERY MORNING FOR DIABETES   latanoprost 0.005 % ophthalmic solution Commonly known as: XALATAN Place 1 drop into both eyes at bedtime.   lidocaine 5 % Commonly known as: LIDODERM APPLY 1 PATCH TO SKIN AS DIRECTED BY YOUR MEDICAL PROVIDER AS NEEDED (APPLY FOR 12 HOURS, THEN REMOVE FOR 12 HOURS) PLACE 12 HOURS ON AND 12 HOURS OFF   lisinopril-hydrochlorothiazide 10-12.5 MG tablet Commonly known as: Zestoretic Take 1 tablet by mouth daily.   metFORMIN 500 MG 24 hr tablet Commonly known as: GLUCOPHAGE-XR TAKE TWO TABLETS BY MOUTH BEFORE BREAKFAST AND TAKE ONE TABLET AT BEDTIME FOR DIABETES (ANNUAL KIDNEY FUNCTION TESTING IS NEEDED)   nitroGLYCERIN 0.4 MG SL tablet Commonly known as: Nitrostat Place 1 tablet (0.4 mg total)  under the tongue every 5 (five) minutes as needed for chest pain.   OCUSOFT LID SCRUB EX Apply 1 application  topically daily as needed.   traZODone 50 MG tablet Commonly known as: DESYREL Take 50 mg by mouth at bedtime as needed for sleep.          Outstanding Labs/Studies   None.   Duration of Discharge Encounter   Greater than 30 minutes including physician time.  Signed, Cline Crock, PA-C 06/05/2023, 9:55 AM [phone]   I have personally seen and examined this patient. I agree with the assessment and plan as outlined above.  Pt doing well today post TAVR. Groins stable without hematoma. Sinus on telemetry. BP stable. No complaints. Echo with no PVL.  Will discharge home today.   Verne Carrow, MD, [facility] 06/05/2023 11:20 AM

## 2023-06-05 ENCOUNTER — Encounter (HOSPITAL_COMMUNITY): Payer: Self-pay | Admitting: Cardiovascular Disease

## 2023-06-05 ENCOUNTER — Inpatient Hospital Stay (HOSPITAL_COMMUNITY): Payer: No Typology Code available for payment source

## 2023-06-05 DIAGNOSIS — Z952 Presence of prosthetic heart valve: Secondary | ICD-10-CM

## 2023-06-05 LAB — BASIC METABOLIC PANEL
Anion gap: 8 (ref 5–15)
BUN: 20 mg/dL (ref 8–23)
CO2: 22 mmol/L (ref 22–32)
Calcium: 8.7 mg/dL — ABNORMAL LOW (ref 8.9–10.3)
Chloride: 108 mmol/L (ref 98–111)
Creatinine, Ser: 1.26 mg/dL — ABNORMAL HIGH (ref 0.61–1.24)
GFR, Estimated: 58 mL/min — ABNORMAL LOW (ref >60)
Glucose, Bld: 104 mg/dL — ABNORMAL HIGH (ref 70–99)
Potassium: 3.7 mmol/L (ref 3.5–5.1)
Sodium: 138 mmol/L (ref 135–145)

## 2023-06-05 LAB — CBC
HCT: 37.4 % — ABNORMAL LOW (ref 39.0–52.0)
Hemoglobin: 12.3 g/dL — ABNORMAL LOW (ref 13.0–17.0)
MCH: 31.3 pg (ref 26.0–34.0)
MCHC: 32.9 g/dL (ref 30.0–36.0)
MCV: 95.2 fL (ref 80.0–100.0)
Platelets: 72 10*3/uL — ABNORMAL LOW (ref 150–400)
RBC: 3.93 MIL/uL — ABNORMAL LOW (ref 4.22–5.81)
RDW: 14.2 % (ref 11.5–15.5)
WBC: 6.4 10*3/uL (ref 4.0–10.5)
nRBC: 0 % (ref 0.0–0.2)

## 2023-06-05 LAB — ECHOCARDIOGRAM COMPLETE
AR max vel: 2.39 cm2
AV Area VTI: 2.53 cm2
AV Area mean vel: 2.43 cm2
AV Mean grad: 12 mm[Hg]
AV Peak grad: 19.4 mm[Hg]
Ao pk vel: 2.21 m/s
Area-P 1/2: 2.41 cm2
Height: 73 in
S' Lateral: 4 cm
Weight: 4368 [oz_av]

## 2023-06-05 LAB — GLUCOSE, CAPILLARY: Glucose-Capillary: 135 mg/dL — ABNORMAL HIGH (ref 70–99)

## 2023-06-05 LAB — MAGNESIUM: Magnesium: 2.2 mg/dL (ref 1.7–2.4)

## 2023-06-05 NOTE — Progress Notes (Signed)
Discharge instructions reviewed with pt.  Copy of instructions given to pt. No new scripts.  Pt's son on his way, pt called him at this time, he is near by and will be here within a few minutes. He was asked to call pt back when he arrives at main entrance/A and staff will bring pt down.   Pt will be d/c'd via wheelchair with belongings, and will be escorted by hospital volunteer.   Camika Marsico,RN SWOT

## 2023-06-05 NOTE — Progress Notes (Signed)
Mobility Specialist Progress Note:   06/05/23 1028  Mobility  Activity Ambulated with assistance in hallway  Level of Assistance Contact guard assist, steadying assist  Assistive Device Cane  Distance Ambulated (ft) 160 ft  Activity Response Tolerated well  Mobility Referral Yes  $Mobility charge 1 Mobility  Mobility Specialist Start Time (ACUTE ONLY) 0940  Mobility Specialist Stop Time (ACUTE ONLY) 0950  Mobility Specialist Time Calculation (min) (ACUTE ONLY) 10 min   Pre Mobility: 90 HR  During Mobility: 123 HR , 96% SpO2 RA Post Mobility: 96 HR  Pt received in bed, agreeable to mobility. Groin incision sites stable. SV to stand. CG during ambulation. Pt c/o lower back pain during ambulation requiring standing rest break. Pt displayed slight exertion towards EOS, otherwise asx throughout. Pt left in chair with RN present in room.    Leory Plowman  Mobility Specialist Please contact via [employer] office at [phone]

## 2023-06-05 NOTE — Progress Notes (Signed)
S/P TAVR, pt stable for transition home.       06/05/23 1010  TOC Brief Assessment  Insurance and Status Reviewed  Patient has primary care physician Yes  Home environment has been reviewed home  Prior level of function: self  Prior/Current Home Services No current home services  Social Determinants of Health Reivew SDOH reviewed no interventions necessary  Readmission risk has been reviewed Yes  Transition of care needs no transition of care needs at this time

## 2023-06-05 NOTE — Progress Notes (Signed)
Pt recently walked with MT down hall with his cane. Feels well. Discussed restrictions, walking at home (very limited due to back), and CRPII. Pt receptive. Will refer to [place] CRPII (did not do program last year due to lack of communication per pt). Gave DM diet.  [phone] Ethelda Chick BS, ACSM-CEP 06/05/2023 10:31 AM

## 2023-06-05 NOTE — Anesthesia Postprocedure Evaluation (Signed)
Anesthesia Post Note  Patient: Kirk Briskey Lombardo Sr.  Procedure(s) Performed: Transcatheter Aortic Valve Replacement, Transfemoral INTRAOPERATIVE TRANSTHORACIC ECHOCARDIOGRAM     Patient location during evaluation: Other Anesthesia Type: MAC Level of consciousness: awake and alert Pain management: pain level controlled Vital Signs Assessment: post-procedure vital signs reviewed and stable Respiratory status: spontaneous breathing, nonlabored ventilation, respiratory function stable and patient connected to nasal cannula oxygen Cardiovascular status: stable and blood pressure returned to baseline Postop Assessment: no apparent nausea or vomiting Anesthetic complications: no  There were no known notable events for this encounter.  Last Vitals:  Vitals:   06/05/23 0900 06/05/23 1000  BP:    Pulse: 70   Resp: (!) 21 (!) 25  Temp:    SpO2: 93%     Last Pain:  Vitals:   06/05/23 0816  TempSrc: Oral  PainSc: 3                  Granite Godman S

## 2023-06-06 ENCOUNTER — Telehealth: Payer: Self-pay | Admitting: Physician Assistant

## 2023-06-06 NOTE — Telephone Encounter (Signed)
  HEART AND VASCULAR CENTER   MULTIDISCIPLINARY HEART VALVE TEAM   Patient contacted regarding discharge from [facility] on 06/05/23  Patient understands to follow up with a structural heart APP on 11/25 at [address].  Patient understands discharge instructions? yes Patient understands medications and regimen? yes Patient understands to bring all medications to this visit? yes  Cline Crock PA-C  [facility]

## 2023-06-07 ENCOUNTER — Telehealth (HOSPITAL_COMMUNITY): Payer: Self-pay

## 2023-06-07 NOTE — Telephone Encounter (Signed)
Called patient to see if he is interested in the Cardiac Rehab Program. Patient expressed interest. Explained scheduling process and went over insurance, patient verbalized understanding. Will contact patient for scheduling once f/u has been completed.  °

## 2023-06-07 NOTE — Telephone Encounter (Signed)
Pt would like to use [facility] to cover CR, pt stated he has already contacted the [place] to fax [place] auth.

## 2023-06-07 NOTE — Progress Notes (Unsigned)
[facility]   MULTIDISCIPLINARY HEART VALVE TEAM  Structural Heart Office Note:  .    Date:  06/10/2023  ID:  Kirk Dibble Rufener Sr., DOB [birth year], MRN [id number] PCP: [facility]  [facility] Providers Cardiologist:  Verne Carrow, MD  History of Present Illness: .   [name] is a 82 y.o. 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    [name] was initially seen by cardiology after presenting 11/2021 with dyspnea and left arm pain found to have a NSTEMI. LHC showed severe mLAD stenosis treated with PCI. Echocardiogram at that time showed normal LV function at 60-65% with severe aortic stenosis with a mean gradient of , AVA 0.92cm2. He was discharged on DAPT with ASA and Plavix. He was referred to the structural hear team for TAVR work up in the outpatient setting and underwent CT scanning, however, he felt much improved after PCI and it was elected to follow his aortic stenosis. He was last seen 04/2023 at which time he described more fatigue and dyspnea. Repeat echo 05/01/23 showed EF 60-65% with worsening AS with a mean gradient at , AVA 0.85cm2.  He is now s/p successful TAVR with a 26 mm Edwards Sapien 3 THV via the TF approach on 06/04/23. Post operative echo with normal LV function, mean gradient at and AVA at 2.53cm2. He was restarted on ASA and Plavix.   Today he is here alone and reports that he has been doing well since discharge. He is still somewhat SOB but feels this is certainly related to deconditioning, mainly due to being sedentary with chronic back pain. He is walking up and down his 13 steps 4x per day to start. Denies chest pain, palpitations, LE edema, orthopnea, PND, dizziness, or syncope. Denies bleeding in stool or urine.     Physical Exam:   VS:  BP 114/64   Pulse 73   Ht 6\' 1"  (1.854 m)   Wt 273 lb 12.8 oz (124.2 kg)   SpO2 95%   BMI 36.12 kg/m    Wt Readings from Last 3 Encounters:  06/10/23 273 lb 12.8 oz (124.2 kg)  06/04/23 273 lb (123.8 kg)  05/09/23 278 lb (126.1 kg)    General: Well developed, well nourished, NAD Lungs:Clear to ausculation bilaterally. No wheezes, rales, or rhonchi. Breathing is unlabored. Cardiovascular: RRR with S1 S2. No murmurs Extremities: No edema.Groin sites stable.  Neuro: Alert and oriented. No focal deficits. No facial asymmetry. MAE spontaneously. Psych: Responds to questions appropriately with normal affect.    ASSESSMENT AND PLAN: .    Severe AS: s/p successful TAVR with a 26 mm Edwards Sapien 3 THV via the TF approach on 06/04/23. Post operative echo with  Normal LV function, man gradient at , and AVA by VTI at 2.53cm2. EKG today with NSR and no evidence of AV block. Continue ASA and Plavix. Will require lifelong dental SBE with Amoxicillin. This was sent to preferred pharm. Plan one month OV with echocardiogram.    CAD: s/p PCI to mLAD 11/2021 in the setting of NSTEMI. Denies anginal symptoms. Continue ASA and Plavix as well as Lipitor 80mg .    HTN: Stable today with no changes needed. Continue Coreg 12.5mg  BID and lisinopril-HCT 10-12.5mg  daily.   DMT2: [facility]. Continue Metformin.  AAA: 4.5cm by [place] 12/2022. Continue surveillance monitoring.   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 Plan to continue to follow over time.    Pulmonary nodules: Noted on pre TAVR CTs in 11/2021. Most recent CT in 07/2022 reported "stable bilateral ground-glass and solid nodules as above. Repeat CT is recommended every 2 years until 5 years of stability has been established." Patient follows closely with a pulmonary clinic through the [place] who has been watching this. Next scan planned for 08/2023.    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 Recommend follow up pre and post contrast MRI/MRCP or pancreatic protocol CT in 2 years. Also [facility]. Discussed and will continue regular [facility] interval with [facility].      Cardiac Rehabilitation Eligibility Assessment      I spent 20 minutes caring for this patient today including face-to-face discussions, ordering and reviewing labs, reviewing records from [facility] and other outside facilities, documenting in the record, and arranging for follow up.    Signed, Georgie Chard, NP

## 2023-06-10 ENCOUNTER — Ambulatory Visit: Payer: No Typology Code available for payment source | Attending: Cardiology | Admitting: Cardiology

## 2023-06-10 VITALS — BP 114/64 | HR 73 | Ht 73.0 in | Wt 273.8 lb

## 2023-06-10 DIAGNOSIS — R918 Other nonspecific abnormal finding of lung field: Secondary | ICD-10-CM

## 2023-06-10 DIAGNOSIS — I2510 Atherosclerotic heart disease of native coronary artery without angina pectoris: Secondary | ICD-10-CM | POA: Diagnosis not present

## 2023-06-10 DIAGNOSIS — Z952 Presence of prosthetic heart valve: Secondary | ICD-10-CM

## 2023-06-10 DIAGNOSIS — E785 Hyperlipidemia, unspecified: Secondary | ICD-10-CM | POA: Diagnosis not present

## 2023-06-10 DIAGNOSIS — I714 Abdominal aortic aneurysm, without rupture, unspecified: Secondary | ICD-10-CM

## 2023-06-10 DIAGNOSIS — I10 Essential (primary) hypertension: Secondary | ICD-10-CM | POA: Diagnosis not present

## 2023-06-10 DIAGNOSIS — R911 Solitary pulmonary nodule: Secondary | ICD-10-CM | POA: Diagnosis not present

## 2023-06-10 DIAGNOSIS — K862 Cyst of pancreas: Secondary | ICD-10-CM

## 2023-06-10 DIAGNOSIS — I7121 Aneurysm of the ascending aorta, without rupture: Secondary | ICD-10-CM

## 2023-06-10 DIAGNOSIS — I350 Nonrheumatic aortic (valve) stenosis: Secondary | ICD-10-CM | POA: Diagnosis not present

## 2023-06-10 MED ORDER — AMOXICILLIN 500 MG PO TABS: ORAL_TABLET | ORAL | 2 refills | Status: AC

## 2023-06-10 NOTE — Patient Instructions (Addendum)
Medication Instructions:  START Amoxicillin 2000mg  Take 4 tabs 1 hour prior to procedure *If you need a refill on your cardiac medications before your next appointment, please call your pharmacy*   Lab Work: None ordered   Testing/Procedures: Your physician has requested that you have an echocardiogram. Echocardiography is a painless test that uses sound waves to create images of your heart. It provides your doctor with information about the size and shape of your heart and how well your heart's chambers and valves are working. This procedure takes approximately one hour. There are no restrictions for this procedure. Please do NOT wear cologne, perfume, aftershave, or lotions (deodorant is allowed). Please arrive 15 minutes prior to your appointment time.  Please note: We ask at that you not bring children with you during ultrasound (echo/ vascular) testing. Due to room size and safety concerns, children are not allowed in the ultrasound rooms during exams. Our front office staff cannot provide observation of children in our lobby area while testing is being conducted. An adult accompanying a patient to their appointment will only be allowed in the ultrasound room at the discretion of the ultrasound technician under special circumstances. We apologize for any inconvenience.   Follow-Up: At [facility], you and your health needs are our priority.  As part of our continuing mission to provide you with exceptional heart care, we have created designated Provider Care Teams.  These Care Teams include your primary Cardiologist (physician) and Advanced Practice Providers (APPs -  Physician Assistants and Nurse Practitioners) who all work together to provide you with the care you need, when you need it.  We recommend signing up for the patient portal called "MyChart".  Sign up information is provided on this After Visit Summary.  MyChart is used to connect with patients for Virtual Visits  (Telemedicine).  Patients are able to view lab/test results, encounter notes, upcoming appointments, etc.  Non-urgent messages can be sent to your provider as well.   To learn more about what you can do with MyChart, go to [email or web address].    Your next appointment:   2 month(s)  Provider:   Georgie Chard, NP    Other Instructions

## 2023-07-11 NOTE — Progress Notes (Signed)
 Virtual Visit via Telephone Note   Because of Kirk Roberts Sr.'s co-morbid illnesses, he is at least at moderate risk for complications without adequate follow up.  This format is felt to be most appropriate for this patient at this time.  The patient did not have access to video technology/had technical difficulties with video requiring transitioning to audio format only (telephone).  All issues noted in this document were discussed and addressed.  No physical exam could be performed with this format.  Please refer to the patient's chart for his consent to telehealth for [facility].   Date:  07/22/2023   ID:  Kirk Roberts Sr., DOB Nov 07, 1940, MRN [id number] The patient was identified using 2 identifiers.  Patient Location: Home Provider Location: Home Office  PCP:  Clinic, [facility] Providers Cardiologist:  Lonni Cash, MD {  Evaluation Performed:  Follow-Up Visit  Chief Complaint:  One month s/p TAVR  History of Present Illness:    Kirk Roberts Sr. is a 82 y.o. 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 Initially scheduled as an in-person visit and converted to telehealth given inclement weather and office closure. Echo has already been rescheduled for 08/12/23.    Kirk Roberts was initially seen by cardiology after presenting 11/2021 with dyspnea and left arm pain found to have a NSTEMI. LHC showed severe mLAD stenosis treated with PCI. Echocardiogram at that time showed normal LV function at 60-65% with severe aortic stenosis with a mean gradient of , AVA 0.92cm2. He was discharged on DAPT with ASA and Plavix . He was referred to the structural hear team for TAVR work up in the outpatient setting and underwent CT scanning,  however, he felt much improved after PCI and it was elected to follow his aortic stenosis. He was last seen 04/2023 at which time he described more fatigue and dyspnea. Repeat echo 05/01/23 showed EF 60-65% with worsening AS with a mean gradient at , AVA 0.85cm2.   He is now s/p successful TAVR with a 26 mm Edwards Sapien 3 THV via the TF approach on 06/04/23. Post operative echo with normal LV function, mean gradient at and AVA at 2.53cm2. He was restarted on ASA and Plavix . In follow up he continued to have mild SOB but felt this was related to deconditioning, mainly due to being sedentary with chronic back pain.   He reports the SOB has improved further since he was last seen. He continues to do his exercises and is scheduled to start CRII this week. Otherwise, he denies chest pain, palpitations, LE edema, orthopnea, PND, dizziness, or syncope. Denies bleeding in stool or urine.    Past Medical History:  Diagnosis Date   Arthritis    oa   Back pain    at times   Diabetes mellitus without complication (HCC)    diet controlled   Hard of hearing    Hypertension    Measles 03/16/1949   Mumps 03/16/1949   Nocturia    S/P TAVR (transcatheter aortic valve replacement) 06/04/2023   s/p TAVR with 26 mm Edwards S3UR via the TF approach by Dr. Cash & Dr. Maryjane   Severe aortic stenosis    Sleep apnea    no CPAP   Past Surgical History:  Procedure Laterality Date   colonscopy     CORONARY STENT INTERVENTION N/A 11/22/2021   Procedure: CORONARY STENT INTERVENTION;  Surgeon: Darron Deatrice LABOR, MD;  Location: [facility];  Service: Cardiovascular;  Laterality: N/A;   INTRAOPERATIVE TRANSTHORACIC ECHOCARDIOGRAM N/A 06/04/2023   Procedure: INTRAOPERATIVE TRANSTHORACIC ECHOCARDIOGRAM;  Surgeon: Verlin Lonni BIRCH, MD;  Location: [facility] INVAS[facility] CV [facility];  Service: Cardiovascular;  Laterality: N/A;   left partial knee replacement  2001   RIGHT/LEFT HEART CATH AND CORONARY  ANGIOGRAPHY N/A 11/22/2021   Procedure: RIGHT/LEFT HEART CATH AND CORONARY ANGIOGRAPHY;  Surgeon: Darron Deatrice LABOR, MD;  Location: [facility] INVASIVE CV [facility];  Service: Cardiovascular;  Laterality: N/A;   TOTAL KNEE ARTHROPLASTY Right 05/12/2014   Procedure: RIGHT TOTAL KNEE ARTHROPLASTY;  Surgeon: Dempsey Melodi GAILS, MD;  Location: [facility];  Service: Orthopedics;  Laterality: Right;     No outpatient medications have been marked as taking for the 07/22/23 encounter (Telephone Visit ) with [facility].    Allergies:   Patient has no known allergies.   Social History   Tobacco Use   Smoking status: Former    Current packs/day: 0.00    Average packs/day: 0.5 packs/day for 40.0 years (20.0 ttl pk-yrs)    Types: Cigarettes    Start date: 07/15/1980    Quit date: 07/15/2020    Years since quitting: 3.0   Smokeless tobacco: Never  Substance Use Topics   Alcohol use: No   Drug use: No    Family Hx: The patient's family history includes Breast cancer in his sister; Lung cancer in his brother.  ROS:   Please see the history of present illness.     All other systems reviewed and are negative.  Labs/Other Tests and Data Reviewed:    Recent Labs: 05/31/2023: ALT 43 06/05/2023: BUN 20; Creatinine, Ser 1.26; Hemoglobin 12.3; Magnesium  2.2; Platelets 72; Potassium 3.7; Sodium 138   Recent Lipid Panel Lab Results  Component Value Date/Time   CHOL 98 (L) 12/14/2022 09:27 AM   TRIG 106 12/14/2022 09:27 AM   HDL 99 12/14/2022 09:27 AM   CHOLHDL 1.0 12/14/2022 09:27 AM   CHOLHDL 2.8 11/22/2021 04:12 AM   LDLCALC Comment (A) 12/14/2022 09:27 AM   LDLDIRECT 31 12/18/2022 07:29 AM    Wt Readings from Last 3 Encounters:  06/10/23 273 lb 12.8 oz (124.2 kg)  06/04/23 273 lb (123.8 kg)  05/09/23 278 lb (126.1 kg)     Objective:    Vital Signs:  There were no vitals taken for this visit.   GEN:  no acute distress  ASSESSMENT & PLAN:    Severe AS: Patient continues to do  well with NYHA class I symptoms s/p successful TAVR with a 26 mm Edwards Sapien 3 THV via the TF approach on 06/04/23. Post operative echo with normal LV function, mean gradient at , and AVA by VTI at 2.53cm2. Continue ASA and Plavix  along with lifelong dental SBE. One month post TAVR echo scheduled for 08/12/23. Plan regular follow up with Dr. Verlin (10/2023) and our team for one year.    CAD: s/p PCI to mLAD 11/2021 in the setting of NSTEMI. Denies anginal symptoms. Continue ASA and Plavix  as well as Lipitor  80mg .    HTN: Unable to provide BP for exam today. Previously very stable therefore will continue current regimen with Coreg  12.5mg  BID and lisinopril -HCT 10-12.5mg  daily.   DMT2: [facility]. Continue Metformin.     AAA: 4.5cm by US  12/2022.  Has upcoming surveillance monitoring.    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 See above   Pulmonary nodules: Noted on pre TAVR CTs in 11/2021. Most recent CT in 07/2022 reported stable bilateral ground-glass and solid nodules as above. Repeat CT is recommended every 2 years until 5 years of stability has been established. Patient follows closely with a pulmonary clinic through the [place] who has been watching this. Next scan planned for 08/2023.    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 Recommend follow up pre and post contrast MRI/MRCP or pancreatic protocol CT in 2 years. [facility]. Discussed and will continue regular [facility] with [facility].    Time:   Today, I have spent 15 minutes with the patient with telehealth technology discussing the above problems.     Medication Adjustments/Labs and Tests Ordered: Current medicines are reviewed at length with the patient today.  Concerns regarding medicines are outlined above.   Tests Ordered: No orders of the defined types were placed in this encounter.   Medication Changes: No orders of the defined types were  placed in this encounter.   Follow Up:  In Person  with Dr. Verlin 10/2023  Signed, Kate Minus, NP  07/22/2023 10:27 AM    [facility]

## 2023-07-18 ENCOUNTER — Encounter (HOSPITAL_COMMUNITY): Payer: Self-pay

## 2023-07-18 ENCOUNTER — Telehealth (HOSPITAL_COMMUNITY): Payer: Self-pay

## 2023-07-18 NOTE — Telephone Encounter (Signed)
  Called pt to confirm appt for 07/23/23 at 0930. Gave pt instructions for appt, what to wear, office address, eating/taking meds before, and if sick to call and reschedule. Pt voiced understanding, all questions answered.   Health history completed? Yes   Con KATHEE Pereyra, MS, ACSM-CEP 07/18/2023 3:19 PM

## 2023-07-22 ENCOUNTER — Other Ambulatory Visit: Payer: Self-pay | Admitting: Cardiology

## 2023-07-22 ENCOUNTER — Ambulatory Visit (HOSPITAL_COMMUNITY): Payer: No Typology Code available for payment source

## 2023-07-22 ENCOUNTER — Ambulatory Visit: Payer: No Typology Code available for payment source | Attending: Nurse Practitioner | Admitting: Cardiology

## 2023-07-22 DIAGNOSIS — I10 Essential (primary) hypertension: Secondary | ICD-10-CM | POA: Diagnosis not present

## 2023-07-22 DIAGNOSIS — Z952 Presence of prosthetic heart valve: Secondary | ICD-10-CM

## 2023-07-22 DIAGNOSIS — I350 Nonrheumatic aortic (valve) stenosis: Secondary | ICD-10-CM

## 2023-07-22 DIAGNOSIS — I2510 Atherosclerotic heart disease of native coronary artery without angina pectoris: Secondary | ICD-10-CM

## 2023-07-22 DIAGNOSIS — I714 Abdominal aortic aneurysm, without rupture, unspecified: Secondary | ICD-10-CM

## 2023-07-22 DIAGNOSIS — K862 Cyst of pancreas: Secondary | ICD-10-CM

## 2023-07-22 DIAGNOSIS — R918 Other nonspecific abnormal finding of lung field: Secondary | ICD-10-CM

## 2023-07-22 DIAGNOSIS — I7121 Aneurysm of the ascending aorta, without rupture: Secondary | ICD-10-CM

## 2023-07-23 ENCOUNTER — Telehealth (HOSPITAL_COMMUNITY): Payer: Self-pay | Admitting: *Deleted

## 2023-07-23 ENCOUNTER — Ambulatory Visit (HOSPITAL_COMMUNITY): Payer: No Typology Code available for payment source

## 2023-07-23 NOTE — Telephone Encounter (Signed)
 Spoke with Kirk Roberts he has cold symptoms and will not be able to attend cardiac rehab orientation this morning. Encouraged [facility] if [facility] symptoms persist. Patient states understanding. Patient will call back to reschedule his appointment when he is feeling better.Hadassah Elpidio Quan RN BSN

## 2023-07-29 ENCOUNTER — Ambulatory Visit (HOSPITAL_COMMUNITY): Payer: No Typology Code available for payment source

## 2023-07-30 ENCOUNTER — Ambulatory Visit (HOSPITAL_COMMUNITY)
Admission: RE | Admit: 2023-07-30 | Discharge: 2023-07-30 | Disposition: A | Discharge status: Home / Self Care | Payer: No Typology Code available for payment source | Source: Ambulatory Visit | Attending: Cardiology | Admitting: Cardiology

## 2023-07-30 DIAGNOSIS — I714 Abdominal aortic aneurysm, without rupture, unspecified: Secondary | ICD-10-CM | POA: Diagnosis present

## 2023-07-30 DIAGNOSIS — I7121 Aneurysm of the ascending aorta, without rupture: Secondary | ICD-10-CM | POA: Insufficient documentation

## 2023-07-31 ENCOUNTER — Ambulatory Visit (HOSPITAL_COMMUNITY): Payer: No Typology Code available for payment source

## 2023-08-02 ENCOUNTER — Telehealth: Payer: Self-pay | Admitting: *Deleted

## 2023-08-02 ENCOUNTER — Ambulatory Visit (HOSPITAL_COMMUNITY): Payer: No Typology Code available for payment source

## 2023-08-02 DIAGNOSIS — I714 Abdominal aortic aneurysm, without rupture, unspecified: Secondary | ICD-10-CM

## 2023-08-02 NOTE — Telephone Encounter (Signed)
-----   Message from Verne Carrow sent at 07/30/2023  2:54 PM EST ----- He has had enlargement of his AAA now 5. 3 cm. He will need urgent referral to see one of the vascular surgeons at [facility]. Thanks, Thayer Ohm

## 2023-08-02 NOTE — Telephone Encounter (Deleted)
-----   Message from Verne Carrow sent at 07/30/2023  2:54 PM EST ----- He has had enlargement of his AAA now 5. 3 cm. He will need urgent referral to see one of the vascular surgeons at [facility]. Thanks, Thayer Ohm

## 2023-08-02 NOTE — Telephone Encounter (Signed)
Urgent referral placed to [facility].  Spoke w patient and made him aware of change in AAA and to expect call from [facility] to schedule so that they can follow this/advise on next steps if needed.

## 2023-08-05 ENCOUNTER — Ambulatory Visit (HOSPITAL_COMMUNITY): Payer: No Typology Code available for payment source

## 2023-08-05 NOTE — Progress Notes (Unsigned)
Patient name: Kirk Roberts. MRN: [id number] DOB: 1940-09-29 Sex: male  REASON FOR CONSULT: 5.3 cm AAA  HPI: Kirk Blazejewski Guillette Roberts. is a 83 y.o. male, with history of diabetes, hypertension, aortic stenosis status post TAVR who presents for evaluation of a 5.3 cm AAA.  Patient had a AAA duplex on 07/30/2023 showing a 5.3 cm AAA.  Prior to this on 01/01/2023 he had a AAA duplex showing his aneurysm measured 4.5 cm.  Past Medical History:  Diagnosis Date   Arthritis    oa   Back pain    at times   Diabetes mellitus without complication (HCC)    diet controlled   Hard of hearing    Hypertension    Measles 03/16/1949   Mumps 03/16/1949   Nocturia    S/P TAVR (transcatheter aortic valve replacement) 06/04/2023   s/p TAVR with 26 mm Edwards S3UR via the TF approach by Dr. Clifton James & Dr. Leafy Ro   Severe aortic stenosis    Sleep apnea    no CPAP    Past Surgical History:  Procedure Laterality Date   colonscopy     CORONARY STENT INTERVENTION N/A 11/22/2021   Procedure: CORONARY STENT INTERVENTION;  Surgeon: Iran Ouch, MD;  Location: [facility];  Service: Cardiovascular;  Laterality: N/A;   INTRAOPERATIVE TRANSTHORACIC ECHOCARDIOGRAM N/A 06/04/2023   Procedure: INTRAOPERATIVE TRANSTHORACIC ECHOCARDIOGRAM;  Surgeon: Kathleene Hazel, MD;  Location: [facility];  Service: Cardiovascular;  Laterality: N/A;   left partial knee replacement  2001   RIGHT/LEFT HEART CATH AND CORONARY ANGIOGRAPHY N/A 11/22/2021   Procedure: RIGHT/LEFT HEART CATH AND CORONARY ANGIOGRAPHY;  Surgeon: Iran Ouch, MD;  Location: [facility];  Service: Cardiovascular;  Laterality: N/A;   TOTAL KNEE ARTHROPLASTY Right 05/12/2014   Procedure: RIGHT TOTAL KNEE ARTHROPLASTY;  Surgeon: Loanne Drilling, MD;  Location: [facility];  Service: Orthopedics;  Laterality: Right;    Family History  Problem Relation Age of Onset   Breast cancer Sister        both  sisters   Lung cancer Brother        both brothers    SOCIAL HISTORY: Social History   Socioeconomic History   Marital status: Widowed    Spouse name: Not on file   Number of children: 2   Years of education: Not on file   Highest education level: Not on file  Occupational History   Occupation: Retired Research officer, trade union  Tobacco Use   Smoking status: Former    Current packs/day: 0.00    Average packs/day: 0.5 packs/day for 40.0 years (20.0 ttl pk-yrs)    Types: Cigarettes    Start date: 07/15/1980    Quit date: 07/15/2020    Years since quitting: 3.0   Smokeless tobacco: Never  Substance and Sexual Activity   Alcohol use: No   Drug use: No   Sexual activity: Not on file  Other Topics Concern   Not on file  Social History Narrative   Not on file   Social Drivers of Health   Financial Resource Strain: Not on file  Food Insecurity: No Food Insecurity (06/04/2023)   Hunger Vital Sign    Worried About Running Out of Food in the Last Year: Never true    Ran Out of Food in the Last Year: Never true  Transportation Needs: No Transportation Needs (06/04/2023)   PRAPARE - Administrator, Civil Service (Medical): No  Lack of Transportation (Non-Medical): No  Physical Activity: Not on file  Stress: Not on file  Social Connections: Unknown (11/22/2021)   Received from [facility], [facility]   Social Network    Social Network: Not on file  Intimate Partner Violence: Not At Risk (06/04/2023)   Humiliation, Afraid, Rape, and Kick questionnaire    Fear of Current or Ex-Partner: No    Emotionally Abused: No    Physically Abused: No    Sexually Abused: No    No Known Allergies  Current Outpatient Medications  Medication Sig Dispense Refill   acetaminophen (TYLENOL) 325 MG tablet Take 2 tablets (650 mg total) by mouth every 6 (six) hours as needed for mild pain (or Fever >/= 101). (Patient taking differently: Take 1,000 mg by mouth every 6 (six) hours  as needed for mild pain (pain score 1-3) (or Fever >/= 101).) 40 tablet 0   amoxicillin (AMOXIL) 500 MG tablet Take 4 tablets 1 hour prior to dental procedures 4 tablet 2   aspirin EC 81 MG tablet Take 81 mg by mouth daily. Swallow whole.     atorvastatin (LIPITOR) 80 MG tablet Take 1 tablet (80 mg total) by mouth daily. 30 tablet 2   buprenorphine (BUTRANS) 10 MCG/HR PTWK Place 1 patch onto the skin once a week.     carboxymethylcellulose (REFRESH PLUS) 0.5 % SOLN Place 1 drop into both eyes 3 (three) times daily as needed (dry eye).     carvedilol (COREG) 25 MG tablet Take 12.5 mg by mouth 2 (two) times daily with a meal.     Cholecalciferol 50 MCG (2000 UT) TABS Take 2,000 Units by mouth daily.     clopidogrel (PLAVIX) 75 MG tablet Take 1 tablet (75 mg total) by mouth daily with breakfast. 30 tablet 3   diclofenac Sodium (VOLTAREN) 1 % GEL Apply 4 g topically daily as needed (Pain).     Eyelid Cleansers (OCUSOFT LID SCRUB EX) Apply 1 application  topically daily as needed.     glimepiride (AMARYL) 2 MG tablet TAKE ONE TABLET BY MOUTH EVERY MORNING FOR DIABETES     latanoprost (XALATAN) 0.005 % ophthalmic solution Place 1 drop into both eyes at bedtime.     lidocaine (LIDODERM) 5 % APPLY 1 PATCH TO SKIN AS DIRECTED BY YOUR MEDICAL PROVIDER AS NEEDED (APPLY FOR 12 HOURS, THEN REMOVE FOR 12 HOURS) PLACE 12 HOURS ON AND 12 HOURS OFF     lisinopril-hydrochlorothiazide (ZESTORETIC) 10-12.5 MG tablet Take 1 tablet by mouth daily. 90 tablet 3   metFORMIN (GLUCOPHAGE-XR) 500 MG 24 hr tablet TAKE TWO TABLETS BY MOUTH BEFORE BREAKFAST AND TAKE ONE TABLET AT BEDTIME FOR DIABETES (ANNUAL KIDNEY FUNCTION TESTING IS NEEDED)     nitroGLYCERIN (NITROSTAT) 0.4 MG SL tablet Place 1 tablet (0.4 mg total) under the tongue every 5 (five) minutes as needed for chest pain. 25 tablet 1   traZODone (DESYREL) 50 MG tablet Take 50 mg by mouth at bedtime as needed for sleep.     vitamin B-12 (CYANOCOBALAMIN) 500 MCG tablet  Take 1,000 mcg by mouth daily.     No current facility-administered medications for this visit.    REVIEW OF SYSTEMS:  [X]  denotes positive finding, [ ]  denotes negative finding Cardiac  Comments:  Chest pain or chest pressure: ***   Shortness of breath upon exertion:    Short of breath when lying flat:    Irregular heart rhythm:        Vascular  Pain in calf, thigh, or hip brought on by ambulation:    Pain in feet at night that wakes you up from your sleep:     Blood clot in your veins:    Leg swelling:         Pulmonary    Oxygen at home:    Productive cough:     Wheezing:         Neurologic    Sudden weakness in arms or legs:     Sudden numbness in arms or legs:     Sudden onset of difficulty speaking or slurred speech:    Temporary loss of vision in one eye:     Problems with dizziness:         Gastrointestinal    Blood in stool:     Vomited blood:         Genitourinary    Burning when urinating:     Blood in urine:        Psychiatric    Major depression:         Hematologic    Bleeding problems:    Problems with blood clotting too easily:        Skin    Rashes or ulcers:        Constitutional    Fever or chills:      PHYSICAL EXAM: There were no vitals filed for this visit.  GENERAL: The patient is a well-nourished male, in no acute distress. The vital signs are documented above. CARDIAC: There is a regular rate and rhythm.  VASCULAR: *** PULMONARY: There is good air exchange bilaterally without wheezing or rales. ABDOMEN: Soft and non-tender with normal pitched bowel sounds.  MUSCULOSKELETAL: There are no major deformities or cyanosis. NEUROLOGIC: No focal weakness or paresthesias are detected. SKIN: There are no ulcers or rashes noted. PSYCHIATRIC: The patient has a normal affect.  DATA:   ***  Assessment/Plan:   83 y.o. male, with history of diabetes, hypertension, aortic stenosis status post TAVR who presents for evaluation of a 5.3  cm AAA.  Patient had a AAA duplex on 07/30/2023 showing a 5.3 cm AAA.  Prior to this on 01/01/2023 he had a AAA duplex showing his aneurysm measured 4.5 cm.   Cephus Shelling, MD [facility] [place] Office: [phone]

## 2023-08-06 ENCOUNTER — Ambulatory Visit (INDEPENDENT_AMBULATORY_CARE_PROVIDER_SITE_OTHER): Payer: No Typology Code available for payment source | Admitting: Vascular Surgery

## 2023-08-06 ENCOUNTER — Encounter: Payer: Self-pay | Admitting: Vascular Surgery

## 2023-08-06 VITALS — BP 146/91 | HR 78 | Temp 97.6°F | Resp 22 | Ht 73.0 in | Wt 272.2 lb

## 2023-08-06 DIAGNOSIS — I7143 Infrarenal abdominal aortic aneurysm, without rupture: Secondary | ICD-10-CM | POA: Diagnosis not present

## 2023-08-07 ENCOUNTER — Ambulatory Visit (HOSPITAL_COMMUNITY): Payer: No Typology Code available for payment source

## 2023-08-09 ENCOUNTER — Ambulatory Visit (HOSPITAL_COMMUNITY): Payer: No Typology Code available for payment source

## 2023-08-12 ENCOUNTER — Encounter: Payer: Self-pay | Admitting: Cardiology

## 2023-08-12 ENCOUNTER — Ambulatory Visit (HOSPITAL_COMMUNITY): Payer: No Typology Code available for payment source | Attending: Cardiology

## 2023-08-12 ENCOUNTER — Ambulatory Visit (HOSPITAL_COMMUNITY): Payer: No Typology Code available for payment source

## 2023-08-12 DIAGNOSIS — Z952 Presence of prosthetic heart valve: Secondary | ICD-10-CM

## 2023-08-12 LAB — ECHOCARDIOGRAM COMPLETE
AV Mean grad: 8 mm[Hg]
AV Peak grad: 16.2 mm[Hg]
Ao pk vel: 2.01 m/s
Area-P 1/2: 1.5 cm2
S' Lateral: 3.3 cm

## 2023-08-12 MED ORDER — PERFLUTREN LIPID MICROSPHERE
1.0000 mL | INTRAVENOUS | Status: AC | PRN
  Administered 2023-08-12: 2 mL via INTRAVENOUS

## 2023-08-13 ENCOUNTER — Telehealth (HOSPITAL_COMMUNITY): Payer: Self-pay

## 2023-08-13 NOTE — Telephone Encounter (Signed)
[facility] called and stated pt has to have [facility] and would like to hold off with scheduling CR at this time.   Closed referral

## 2023-08-14 ENCOUNTER — Ambulatory Visit (HOSPITAL_COMMUNITY): Payer: No Typology Code available for payment source

## 2023-08-16 ENCOUNTER — Ambulatory Visit (HOSPITAL_COMMUNITY): Payer: No Typology Code available for payment source

## 2023-08-19 ENCOUNTER — Ambulatory Visit (HOSPITAL_COMMUNITY): Payer: No Typology Code available for payment source

## 2023-08-21 ENCOUNTER — Ambulatory Visit (HOSPITAL_COMMUNITY): Payer: No Typology Code available for payment source

## 2023-08-23 ENCOUNTER — Ambulatory Visit (HOSPITAL_COMMUNITY): Payer: No Typology Code available for payment source

## 2023-08-26 ENCOUNTER — Ambulatory Visit (HOSPITAL_COMMUNITY): Payer: No Typology Code available for payment source

## 2023-08-28 ENCOUNTER — Encounter: Payer: No Typology Code available for payment source | Admitting: Vascular Surgery

## 2023-08-28 ENCOUNTER — Ambulatory Visit (HOSPITAL_COMMUNITY): Payer: No Typology Code available for payment source

## 2023-08-28 ENCOUNTER — Other Ambulatory Visit: Payer: Self-pay

## 2023-08-28 DIAGNOSIS — I7143 Infrarenal abdominal aortic aneurysm, without rupture: Secondary | ICD-10-CM

## 2023-08-30 ENCOUNTER — Ambulatory Visit (HOSPITAL_COMMUNITY): Payer: No Typology Code available for payment source

## 2023-09-02 ENCOUNTER — Ambulatory Visit (HOSPITAL_COMMUNITY): Payer: No Typology Code available for payment source

## 2023-09-04 ENCOUNTER — Ambulatory Visit (HOSPITAL_COMMUNITY): Payer: No Typology Code available for payment source

## 2023-09-06 ENCOUNTER — Ambulatory Visit (HOSPITAL_COMMUNITY): Payer: No Typology Code available for payment source

## 2023-09-09 ENCOUNTER — Ambulatory Visit (HOSPITAL_COMMUNITY): Payer: No Typology Code available for payment source

## 2023-09-11 ENCOUNTER — Ambulatory Visit (HOSPITAL_COMMUNITY): Payer: No Typology Code available for payment source

## 2023-09-13 ENCOUNTER — Ambulatory Visit (HOSPITAL_COMMUNITY): Payer: No Typology Code available for payment source

## 2023-09-16 ENCOUNTER — Ambulatory Visit (HOSPITAL_COMMUNITY): Payer: No Typology Code available for payment source

## 2023-09-17 ENCOUNTER — Ambulatory Visit
Admission: RE | Admit: 2023-09-17 | Discharge: 2023-09-17 | Disposition: A | Discharge status: Home / Self Care | Payer: No Typology Code available for payment source | Source: Ambulatory Visit | Attending: Vascular Surgery

## 2023-09-17 DIAGNOSIS — I7143 Infrarenal abdominal aortic aneurysm, without rupture: Secondary | ICD-10-CM

## 2023-09-17 MED ORDER — IOPAMIDOL (ISOVUE-370) INJECTION 76%
100.0000 mL | Freq: Once | INTRAVENOUS | Status: AC | PRN
  Administered 2023-09-17: 100 mL via INTRAVENOUS

## 2023-09-18 ENCOUNTER — Ambulatory Visit (HOSPITAL_COMMUNITY): Payer: No Typology Code available for payment source

## 2023-09-20 ENCOUNTER — Ambulatory Visit (HOSPITAL_COMMUNITY): Payer: No Typology Code available for payment source

## 2023-09-23 ENCOUNTER — Ambulatory Visit (HOSPITAL_COMMUNITY): Payer: No Typology Code available for payment source

## 2023-09-24 ENCOUNTER — Encounter: Payer: Self-pay | Admitting: Vascular Surgery

## 2023-09-24 ENCOUNTER — Ambulatory Visit (INDEPENDENT_AMBULATORY_CARE_PROVIDER_SITE_OTHER): Payer: No Typology Code available for payment source | Admitting: Vascular Surgery

## 2023-09-24 VITALS — BP 139/86 | HR 66 | Temp 98.3°F | Resp 22 | Ht 73.0 in | Wt 273.9 lb

## 2023-09-24 DIAGNOSIS — I7143 Infrarenal abdominal aortic aneurysm, without rupture: Secondary | ICD-10-CM

## 2023-09-24 NOTE — Progress Notes (Signed)
 Patient name: Kirk Herendeen Prochnow Sr. 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 is a 83 y.o. male, with history of diabetes, hypertension, aortic stenosis status post TAVR who presents for follow-up after CTA for evaluation of a 5.3 cm AAA.  Patient had a AAA duplex on 07/30/2023 showing a 5.3 cm AAA.  Prior to this on 01/01/2023 he had a AAA duplex showing his aneurysm measured 4.5 cm.  Referred by Dr. Clifton James with cardiology.  Ultimately I referred him for CTA given concern for rapid growth of his aneurysm.  CTA on 09/17/2023 shows aneurysm measuring 4.9 cm.   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 Clifton James & Dr. Leafy Ro   Severe aortic stenosis    Sleep apnea    no CPAP    Past Surgical History:  Procedure Laterality Date   colonscopy     CORONARY STENT INTERVENTION N/A 11/22/2021   Procedure: CORONARY STENT INTERVENTION;  Surgeon: Iran Ouch, MD;  Location: [facility];  Service: Cardiovascular;  Laterality: N/A;   INTRAOPERATIVE TRANSTHORACIC ECHOCARDIOGRAM N/A 06/04/2023   Procedure: INTRAOPERATIVE TRANSTHORACIC ECHOCARDIOGRAM;  Surgeon: Kathleene Hazel, MD;  Location: [facility];  Service: Cardiovascular;  Laterality: N/A;   left partial knee replacement  2001   RIGHT/LEFT HEART CATH AND CORONARY ANGIOGRAPHY N/A 11/22/2021   Procedure: RIGHT/LEFT HEART CATH AND CORONARY ANGIOGRAPHY;  Surgeon: Iran Ouch, MD;  Location: [facility];  Service: Cardiovascular;  Laterality: N/A;    TOTAL KNEE ARTHROPLASTY Right 05/12/2014   Procedure: RIGHT TOTAL KNEE ARTHROPLASTY;  Surgeon: Loanne Drilling, MD;  Location: [facility];  Service: Orthopedics;  Laterality: Right;    Family History  Problem Relation Age of Onset   Breast cancer Sister        both sisters   Lung cancer Brother        both brothers    SOCIAL HISTORY: Social History   Socioeconomic History   Marital status: Widowed    Spouse name: Not on file   Number of children: 2   Years of education: Not on file   Highest education level: Not on file  Occupational History   Occupation: Retired Research officer, trade union  Tobacco Use   Smoking status: Former    Current packs/day: 0.00    Average packs/day: 0.5 packs/day for 40.0 years (20.0 ttl pk-yrs)    Types: Cigarettes    Start date: 07/15/1980    Quit date: 07/15/2020    Years since quitting: 3.1   Smokeless tobacco: Never  Vaping Use   Vaping status: Never Used  Substance and Sexual Activity   Alcohol use: No   Drug use: No   Sexual activity: Not on file  Other Topics Concern   Not on file  Social History Narrative   Not on file   Social Drivers of Health   Financial Resource Strain: Not on file  Food  Insecurity: No Food Insecurity (06/04/2023)   Hunger Vital Sign    Worried About Running Out of Food in the Last Year: Never true    Ran Out of Food in the Last Year: Never true  Transportation Needs: No Transportation Needs (06/04/2023)   PRAPARE - Administrator, Civil Service (Medical): No    Lack of Transportation (Non-Medical): No  Physical Activity: Not on file  Stress: Not on file  Social Connections: Unknown (11/22/2021)   [facility], [facility]   Social Network    Social Network: Not on file  Intimate Partner Violence: Not At Risk (06/04/2023)   Humiliation, Afraid, Rape, and Kick questionnaire    Fear of Current or Ex-Partner: No    Emotionally Abused: No    Physically Abused: No    Sexually  Abused: No    No Known Allergies  Current Outpatient Medications  Medication Sig Dispense Refill   acetaminophen (TYLENOL) 325 MG tablet Take 2 tablets (650 mg total) by mouth every 6 (six) hours as needed for mild pain (or Fever >/= 101). (Patient taking differently: Take 1,000 mg by mouth every 6 (six) hours as needed for mild pain (pain score 1-3) (or Fever >/= 101).) 40 tablet 0   amoxicillin (AMOXIL) 500 MG tablet Take 4 tablets 1 hour prior to dental procedures 4 tablet 2   aspirin EC 81 MG tablet Take 81 mg by mouth daily. Swallow whole.     atorvastatin (LIPITOR) 80 MG tablet Take 1 tablet (80 mg total) by mouth daily. 30 tablet 2   buprenorphine (BUTRANS) 10 MCG/HR PTWK Place 1 patch onto the skin once a week.     carboxymethylcellulose (REFRESH PLUS) 0.5 % SOLN Place 1 drop into both eyes 3 (three) times daily as needed (dry eye).     carvedilol (COREG) 25 MG tablet Take 12.5 mg by mouth 2 (two) times daily with a meal.     cetirizine (ZYRTEC) 10 MG tablet Take by mouth.     Cholecalciferol 50 MCG (2000 UT) TABS Take 2,000 Units by mouth daily.     clopidogrel (PLAVIX) 75 MG tablet Take 1 tablet (75 mg total) by mouth daily with breakfast. 30 tablet 3   diclofenac Sodium (VOLTAREN) 1 % GEL Apply 4 g topically daily as needed (Pain).     Eyelid Cleansers (OCUSOFT LID SCRUB EX) Apply 1 application  topically daily as needed.     fluticasone (FLONASE) 50 MCG/ACT nasal spray Place into the nose.     glimepiride (AMARYL) 2 MG tablet TAKE ONE TABLET BY MOUTH EVERY MORNING FOR DIABETES     latanoprost (XALATAN) 0.005 % ophthalmic solution Place 1 drop into both eyes at bedtime.     lidocaine (LIDODERM) 5 % APPLY 1 PATCH TO SKIN AS DIRECTED BY YOUR MEDICAL PROVIDER AS NEEDED (APPLY FOR 12 HOURS, THEN REMOVE FOR 12 HOURS) PLACE 12 HOURS ON AND 12 HOURS OFF     lisinopril-hydrochlorothiazide (ZESTORETIC) 10-12.5 MG tablet Take 1 tablet by mouth daily. 90 tablet 3   metFORMIN (GLUCOPHAGE-XR)  500 MG 24 hr tablet TAKE TWO TABLETS BY MOUTH BEFORE BREAKFAST AND TAKE ONE TABLET AT BEDTIME FOR DIABETES (ANNUAL KIDNEY FUNCTION TESTING IS NEEDED)     sodium chloride (OCEAN) 0.65 % nasal spray Place into the nose.     traZODone (DESYREL) 50 MG tablet Take 50 mg by mouth at bedtime as needed for sleep.     vitamin B-12 (CYANOCOBALAMIN) 500 MCG tablet Take  1,000 mcg by mouth daily.     nitroGLYCERIN (NITROSTAT) 0.4 MG SL tablet Place 1 tablet (0.4 mg total) under the tongue every 5 (five) minutes as needed for chest pain. 25 tablet 1   No current facility-administered medications for this visit.    REVIEW OF SYSTEMS:  [X]  denotes positive finding, [ ]  denotes negative finding Cardiac  Comments:  Chest pain or chest pressure:    Shortness of breath upon exertion:    Short of breath when lying flat:    Irregular heart rhythm:        Vascular    Pain in calf, thigh, or hip brought on by ambulation:    Pain in feet at night that wakes you up from your sleep:     Blood clot in your veins:    Leg swelling:         Pulmonary    Oxygen at home:    Productive cough:     Wheezing:         Neurologic    Sudden weakness in arms or legs:     Sudden numbness in arms or legs:     Sudden onset of difficulty speaking or slurred speech:    Temporary loss of vision in one eye:     Problems with dizziness:         Gastrointestinal    Blood in stool:     Vomited blood:         Genitourinary    Burning when urinating:     Blood in urine:        Psychiatric    Major depression:         Hematologic    Bleeding problems:    Problems with blood clotting too easily:        Skin    Rashes or ulcers:        Constitutional    Fever or chills:      PHYSICAL EXAM: Vitals:   09/24/23 1424  BP: 139/86  Pulse: 66  Resp: (!) 22  Temp: 98.3 F (36.8 C)  TempSrc: Temporal  SpO2: 92%  Weight: 273 lb 14.4 oz (124.2 kg)  Height: 6\' 1"  (1.854 m)    GENERAL: The patient is a  well-nourished male, in no acute distress. The vital signs are documented above. CARDIAC: There is a regular rate and rhythm.  VASCULAR:  Bilateral femoral pulses palpable  Bilateral DP pulses palpable PULMONARY: No respiratory distress. ABDOMEN: Soft and non-tender.  MUSCULOSKELETAL: There are no major deformities or cyanosis. NEUROLOGIC: No focal weakness or paresthesias are detected. PSYCHIATRIC: The patient has a normal affect.  DATA:   CTA abdomen pelvis 09/17/2023 shows 4.9 cm abdominal aortic aneurysm  AAA duplex 07/30/2023 shows 5.3 cm fusiform abdominal aortic aneurysm  Assessment/Plan:  83 y.o. male, with history of diabetes, hypertension, aortic stenosis status post TAVR who presents for follow-up after CTA for evaluation of a 5.3 cm AAA.  There was concern that his aneurysm had increased from 4.5 to 5.3 cm in the last 8 months.  I sent him for CTA.  I discussed this CT shows aneurysm measures 4.9 cm in maximal diameter and less than the [place] suggested.  Discussed in men we repair AAA at greater than 5.5 cm.  I do not think growth of 4 mm in a year is rapid growth and will continue observation for now.  Will repeat AAA duplex in 6 months with follow-up here in the office.  Cephus Shelling, MD [facility] [place] Office: [phone]

## 2023-09-25 ENCOUNTER — Ambulatory Visit (HOSPITAL_COMMUNITY): Payer: No Typology Code available for payment source

## 2023-09-27 ENCOUNTER — Other Ambulatory Visit: Payer: Self-pay | Admitting: *Deleted

## 2023-09-27 ENCOUNTER — Ambulatory Visit (HOSPITAL_COMMUNITY): Payer: No Typology Code available for payment source

## 2023-09-27 DIAGNOSIS — I7143 Infrarenal abdominal aortic aneurysm, without rupture: Secondary | ICD-10-CM

## 2023-09-30 ENCOUNTER — Ambulatory Visit (HOSPITAL_COMMUNITY): Payer: No Typology Code available for payment source

## 2023-10-02 ENCOUNTER — Ambulatory Visit (HOSPITAL_COMMUNITY): Payer: No Typology Code available for payment source

## 2023-10-04 ENCOUNTER — Ambulatory Visit (HOSPITAL_COMMUNITY): Payer: No Typology Code available for payment source

## 2023-10-07 ENCOUNTER — Ambulatory Visit (HOSPITAL_COMMUNITY): Payer: No Typology Code available for payment source

## 2023-10-09 ENCOUNTER — Ambulatory Visit (HOSPITAL_COMMUNITY): Payer: No Typology Code available for payment source

## 2023-10-11 ENCOUNTER — Ambulatory Visit (HOSPITAL_COMMUNITY): Payer: No Typology Code available for payment source

## 2023-10-14 ENCOUNTER — Ambulatory Visit (HOSPITAL_COMMUNITY): Payer: No Typology Code available for payment source

## 2023-10-16 ENCOUNTER — Encounter: Payer: Self-pay | Admitting: Physician Assistant

## 2023-10-16 ENCOUNTER — Ambulatory Visit (HOSPITAL_COMMUNITY): Payer: No Typology Code available for payment source

## 2023-11-03 NOTE — Progress Notes (Unsigned)
 Structural [facility] Clinic Note  No chief complaint on file.  History of Present Illness: 83 yo male with history of diabetes mellitus, former tobacco abuse, HTN, AAA, sleep apnea, CAD and severe aortic stenosis now s/p TAVR who is here today for cardiac follow up. He was admitted to [facility] 11/21/21 with dyspnea and left arm pain and was found to have a NSTEMI. Cardiac cath with severe mid LAD stenosis treated with a drug eluting stent. Echo 11/22/21 with LVEF=60-65%. Normal RV function. Severe aortic stenosis. He underwent TAVR in October 2024 with placement of a 26 mm Edwards Sapien 3 Ultra Resilia THV from the transfemoral approach. Echo January 2024 with LVEF=60-65%. Normally functioning AVR with no PVL. He has a AAA which had increased to 4.9 cm by CTA in March 2025.Kirk Roberts He was seen in the [facility] office by Dr. Fulton Job in March 2025 and plans to follow AAA with serial imaging. Known thoracic aortic aneurysm, 4.0 cm CTA January 2024.   He is here today for follow up. The patient denies any chest pain, dyspnea, palpitations, lower extremity edema, orthopnea, PND, dizziness, near syncope or syncope.   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 Abel Hoe & Dr. Honey Lusty   Severe aortic stenosis    Sleep apnea    no CPAP    Past Surgical History:  Procedure Laterality Date   colonscopy     CORONARY STENT INTERVENTION N/A 11/22/2021   Procedure: CORONARY STENT INTERVENTION;  Surgeon: Wenona Hamilton, MD;  Location: [facility];  Service: Cardiovascular;  Laterality: N/A;   INTRAOPERATIVE  TRANSTHORACIC ECHOCARDIOGRAM N/A 06/04/2023   Procedure: INTRAOPERATIVE TRANSTHORACIC ECHOCARDIOGRAM;  Surgeon: Odie Benne, MD;  Location: [facility] INVAS[facility] CV [facility];  Service: Cardiovascular;  Laterality: N/A;   left partial knee replacement  2001   RIGHT/LEFT HEART CATH AND CORONARY ANGIOGRAPHY N/A 11/22/2021   Procedure: RIGHT/LEFT HEART CATH AND CORONARY ANGIOGRAPHY;  Surgeon: Wenona Hamilton, MD;  Location: [facility] INVASIVE CV LAB;  Service: Cardiovascular;  Laterality: N/A;   TOTAL KNEE ARTHROPLASTY Right 05/12/2014   Procedure: RIGHT TOTAL KNEE ARTHROPLASTY;  Surgeon: Aurther Blue, MD;  Location: [facility] OR[facility];  Service: Orthopedics;  Laterality: Right;    Current Outpatient Medications  Medication Sig Dispense Refill   acetaminophen  (TYLENOL ) 325 MG tablet Take 2 tablets (650 mg total) by mouth every 6 (six) hours as needed for mild pain (or Fever >/= 101). (Patient taking differently: Take 1,000 mg by mouth every 6 (six) hours as needed for mild pain (pain score 1-3) (or Fever >/= 101).) 40 tablet 0   amoxicillin  (AMOXIL ) 500 MG tablet Take 4 tablets 1 hour prior to dental procedures 4 tablet 2   aspirin  EC 81 MG tablet Take 81 mg by mouth daily. Swallow whole.     atorvastatin  (LIPITOR ) 80 MG tablet Take 1 tablet (80 mg total) by mouth daily. 30 tablet 2   buprenorphine (BUTRANS) 10 MCG/HR PTWK Place 1 patch onto the skin once a  week.     carboxymethylcellulose (REFRESH PLUS) 0.5 % SOLN Place 1 drop into both eyes 3 (three) times daily as needed (dry eye).     carvedilol  (COREG ) 25 MG tablet Take 12.5 mg by mouth 2 (two) times daily with a meal.     cetirizine (ZYRTEC) 10 MG tablet Take by mouth.     Cholecalciferol 50 MCG (2000 UT) TABS Take 2,000 Units by mouth daily.     clopidogrel  (PLAVIX ) 75 MG tablet Take 1 tablet (75 mg total) by mouth daily with breakfast. 30 tablet 3   diclofenac Sodium (VOLTAREN) 1 % GEL Apply 4 g topically daily as needed (Pain).     Eyelid Cleansers (OCUSOFT  LID SCRUB EX) Apply 1 application  topically daily as needed.     fluticasone (FLONASE) 50 MCG/ACT nasal spray Place into the nose.     glimepiride  (AMARYL ) 2 MG tablet TAKE ONE TABLET BY MOUTH EVERY MORNING FOR DIABETES     latanoprost (XALATAN) 0.005 % ophthalmic solution Place 1 drop into both eyes at bedtime.     lidocaine  (LIDODERM ) 5 % APPLY 1 PATCH TO SKIN AS DIRECTED BY YOUR MEDICAL PROVIDER AS NEEDED (APPLY FOR 12 HOURS, THEN REMOVE FOR 12 HOURS) PLACE 12 HOURS ON AND 12 HOURS OFF     lisinopril -hydrochlorothiazide  (ZESTORETIC ) 10-12.5 MG tablet Take 1 tablet by mouth daily. 90 tablet 3   metFORMIN (GLUCOPHAGE-XR) 500 MG 24 hr tablet TAKE TWO TABLETS BY MOUTH BEFORE BREAKFAST AND TAKE ONE TABLET AT BEDTIME FOR DIABETES (ANNUAL KIDNEY FUNCTION TESTING IS NEEDED)     nitroGLYCERIN  (NITROSTAT ) 0.4 MG SL tablet Place 1 tablet (0.4 mg total) under the tongue every 5 (five) minutes as needed for chest pain. 25 tablet 1   sodium chloride  (OCEAN) 0.65 % nasal spray Place into the nose.     traZODone  (DESYREL ) 50 MG tablet Take 50 mg by mouth at bedtime as needed for sleep.     vitamin B-12 (CYANOCOBALAMIN ) 500 MCG tablet Take 1,000 mcg by mouth daily.     No current facility-administered medications for this visit.    No Known Allergies  Social History   Socioeconomic History   Marital status: Widowed    Spouse name: Not on file   Number of children: 2   Years of education: Not on file   Highest education level: Not on file  Occupational History   Occupation: Retired Research officer, trade union  Tobacco Use   Smoking status: Former    Current packs/day: 0.00    Average packs/day: 0.5 packs/day for 40.0 years (20.0 ttl pk-yrs)    Types: Cigarettes    Start date: 07/15/1980    Quit date: 07/15/2020    Years since quitting: 3.3   Smokeless tobacco: Never  Vaping Use   Vaping status: Never Used  Substance and Sexual Activity   Alcohol use: No   Drug use: No   Sexual activity: Not on  file  Other Topics Concern   Not on file  Social History Narrative   Not on file   Social Drivers of Health   Financial Resource Strain: Not on file  Food Insecurity: No Food Insecurity (06/04/2023)   Hunger Vital Sign    Worried About Running Out of Food in the Last Year: Never true    Ran Out of Food in the Last Year: Never true  Transportation Needs: No Transportation Needs (06/04/2023)   PRAPARE - Administrator, Civil Service (Medical): No  Lack of Transportation (Non-Medical): No  Physical Activity: Not on file  Stress: Not on file  Social Connections: Unknown (11/22/2021)   [facility]   Social Network    Social Network: Not on file  Intimate Partner Violence: Not At Risk (06/04/2023)   Humiliation, Afraid, Rape, and Kick questionnaire    Fear of Current or Ex-Partner: No    Emotionally Abused: No    Physically Abused: No    Sexually Abused: No    Family History  Problem Relation Age of Onset   Breast cancer Sister        both sisters   Lung cancer Brother        both brothers    Review of Systems:  As stated in the HPI and otherwise negative.   There were no vitals taken for this visit.  Physical Examination: General: Well developed, well nourished, NAD  HEENT: OP clear, mucus membranes moist  SKIN: warm, dry. No rashes. Neuro: No focal deficits  Musculoskeletal: Muscle strength 5/5 all ext  Psychiatric: Mood and affect normal  Neck: No JVD, no carotid bruits, no thyromegaly, no lymphadenopathy.  Lungs:Clear bilaterally, no wheezes, rhonci, crackles Cardiovascular: Regular rate and rhythm. No murmurs, gallops or rubs. Abdomen:Soft. Bowel sounds present. Non-tender.  Extremities: No lower extremity edema. Pulses are 2 + in the bilateral DP/PT.  EKG:  EKG is *** ordered today. The ekg ordered today demonstrates   Recent Labs: 05/31/2023: ALT 43 06/05/2023: BUN 20; Creatinine, Ser 1.26; Hemoglobin 12.3;  Magnesium  2.2; Platelets 72; Potassium 3.7; Sodium 138   Lipid Panel    Component Value Date/Time   CHOL 98 (L) 12/14/2022 0927   TRIG 106 12/14/2022 0927   HDL 99 12/14/2022 0927   CHOLHDL 1.0 12/14/2022 0927   CHOLHDL 2.8 11/22/2021 0412   VLDL 35 11/22/2021 0412   LDLCALC Comment (A) 12/14/2022 0927   LDLDIRECT 31 12/18/2022 0729     Wt Readings from Last 3 Encounters:  09/24/23 124.2 kg  08/06/23 123.5 kg  06/10/23 124.2 kg    Assessment and Plan:   1. Severe Aortic Valve Stenosis:  He is now s/p TAVR in October 2024 from the transfemoral approach. His AVR was working well by echo in January 2025.   2. CAD without angina: No chest pain. Continue ASA, Plavix , beta blocker and statin.     3. 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 Will follow with yearly scans. Repeat chest CTA now.   4. AAA: 4.9 cm by CTA in March 2025. Followed in [facility] by Dr. Fulton Job.   5. HTN: BP is well controlled. Continue current therapy  6. 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      7. Hyperlipidemia: LDL 34 in June 2024. Continue statin  Labs/ tests ordered today include:  No orders of the defined types were placed in this encounter.  Disposition:   Follow up with me in one year.     Signed, Antoinette Batman, MD 11/03/2023 9:34 PM    [facility] [address] [place], [place], [place]  [address] Phone: [phone]; Fax: [phone]

## 2023-11-04 ENCOUNTER — Encounter: Payer: Self-pay | Admitting: Cardiovascular Disease

## 2023-11-04 ENCOUNTER — Ambulatory Visit
Payer: No Typology Code available for payment source | Attending: Cardiovascular Disease | Admitting: Cardiovascular Disease

## 2023-11-04 VITALS — BP 154/84 | HR 55 | Ht 73.0 in | Wt 272.0 lb

## 2023-11-04 DIAGNOSIS — I7121 Aneurysm of the ascending aorta, without rupture: Secondary | ICD-10-CM

## 2023-11-04 DIAGNOSIS — E785 Hyperlipidemia, unspecified: Secondary | ICD-10-CM | POA: Diagnosis not present

## 2023-11-04 DIAGNOSIS — I350 Nonrheumatic aortic (valve) stenosis: Secondary | ICD-10-CM

## 2023-11-04 DIAGNOSIS — I2510 Atherosclerotic heart disease of native coronary artery without angina pectoris: Secondary | ICD-10-CM | POA: Diagnosis not present

## 2023-11-04 DIAGNOSIS — Z952 Presence of prosthetic heart valve: Secondary | ICD-10-CM | POA: Diagnosis not present

## 2023-11-04 DIAGNOSIS — I10 Essential (primary) hypertension: Secondary | ICD-10-CM | POA: Diagnosis not present

## 2023-11-04 DIAGNOSIS — I7143 Infrarenal abdominal aortic aneurysm, without rupture: Secondary | ICD-10-CM

## 2023-11-04 NOTE — Patient Instructions (Signed)
 Medication Instructions:  Your physician recommends that you continue on your current medications as directed. Please refer to the Current Medication list given to you today.  *If you need a refill on your cardiac medications before your next appointment, please call your pharmacy*  Lab Work: If you have labs (blood work) drawn today and your tests are completely normal, you will receive your results only by: MyChart Message (if you have MyChart) OR A paper copy in the mail If you have any lab test that is abnormal or we need to change your treatment, we will call you to review the results.  Testing/Procedures: None ordered today.  Follow-Up: At [facility], you and your health needs are our priority.  As part of our continuing mission to provide you with exceptional heart care, our providers are all part of one team.  This team includes your primary Cardiologist (physician) and Advanced Practice Providers or APPs (Physician Assistants and Nurse Practitioners) who all work together to provide you with the care you need, when you need it.  Your next appointment:   Call our office if you need an appointment.  Provider:   Antoinette Batman, MD     We recommend signing up for the patient portal called "MyChart".  Sign up information is provided on this After Visit Summary.  MyChart is used to connect with patients for Virtual Visits (Telemedicine).  Patients are able to view lab/test results, encounter notes, upcoming appointments, etc.  Non-urgent messages can be sent to your provider as well.   To learn more about what you can do with MyChart, go to [email or web address].   Other Instructions       1st Floor: - Lobby - Registration  - Pharmacy  - Lab - Cafe  2nd Floor: - PV Lab - Diagnostic Testing (echo, CT, nuclear med)  3rd Floor: - Vacant  4th Floor: - TCTS (cardiothoracic surgery) - AFib Clinic - Structural Heart Clinic - Vascular Surgery  -  Vascular Ultrasound  5th Floor: - HeartCare Cardiology (general and EP) - Clinical Pharmacy for coumadin, hypertension, lipid, weight-loss medications, and med management appointments    Valet parking services will be available as well.

## 2024-01-04 IMAGING — CT CT ANGIO CHEST-ABD-PELV FOR DISSECTION W/ AND WO/W CM
2 of 7 series · 13 of 46 positions shown, 15 images · non-contrast
Comparison: None Available.

CLINICAL DATA: Chest pain or back pain, aortic dissection suspected

EXAM:
CT ANGIOGRAPHY CHEST, ABDOMEN AND PELVIS
TECHNIQUE: Non-contrast CT of the chest was initially obtained.

[Series 7: arterial · axial · arterial · 0.95mm/px · z∈[+928,+1532]mm · 10 of 340 slices shown, 12 images]
[im 19/340  soft-tissue]
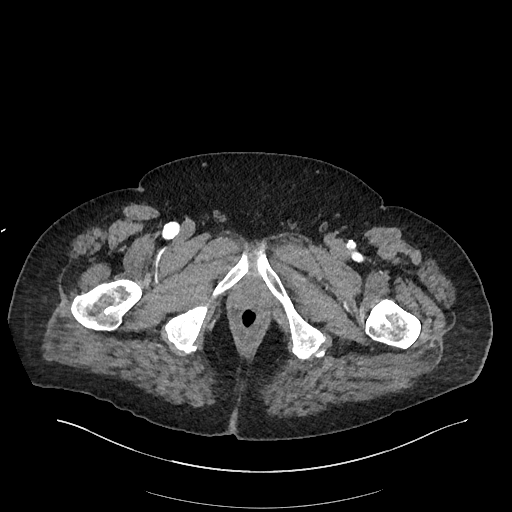
[im 19/340  bone]
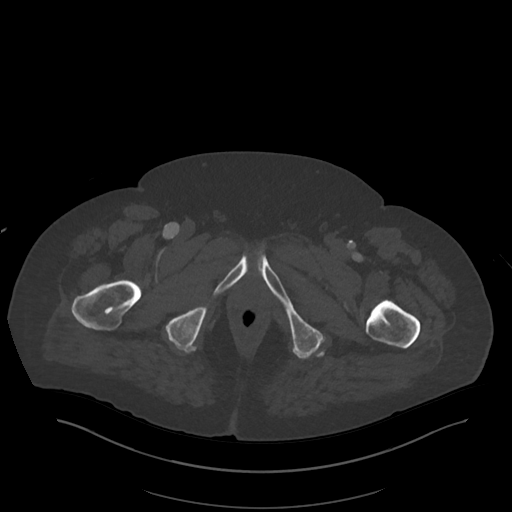
[im 57/340  soft-tissue]
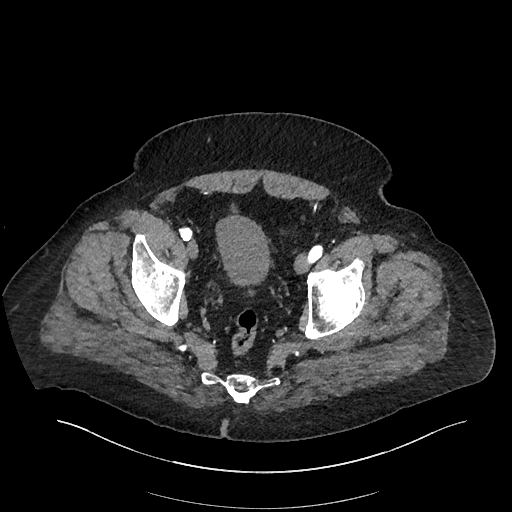
[im 95/340  soft-tissue]
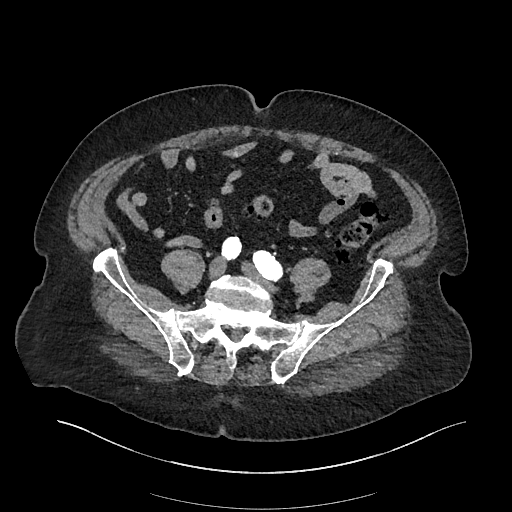
[im 114/340  soft-tissue]
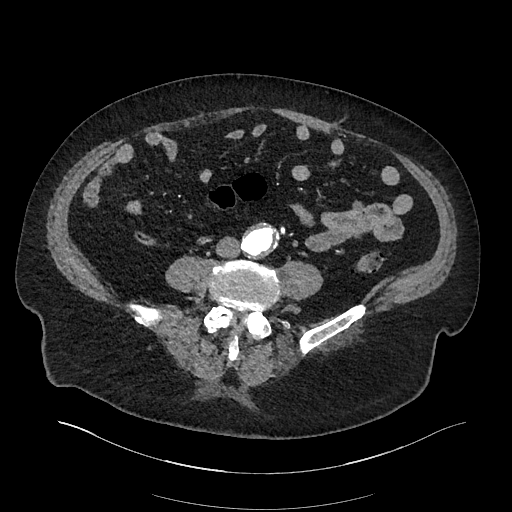
[im 151/340  soft-tissue]
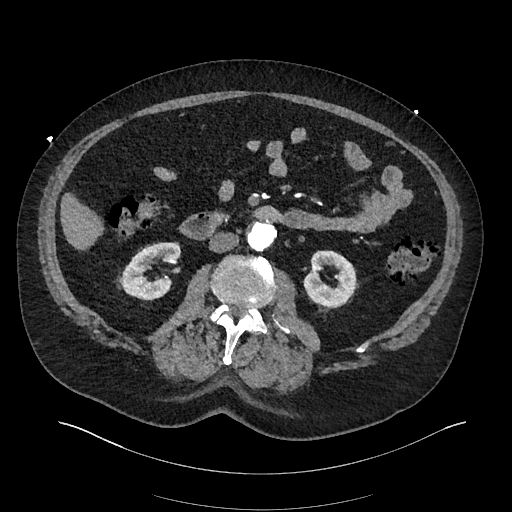
[im 189/340  soft-tissue]
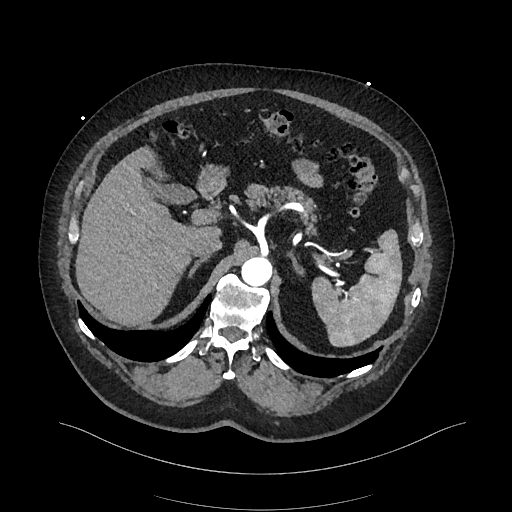
[im 227/340  soft-tissue]
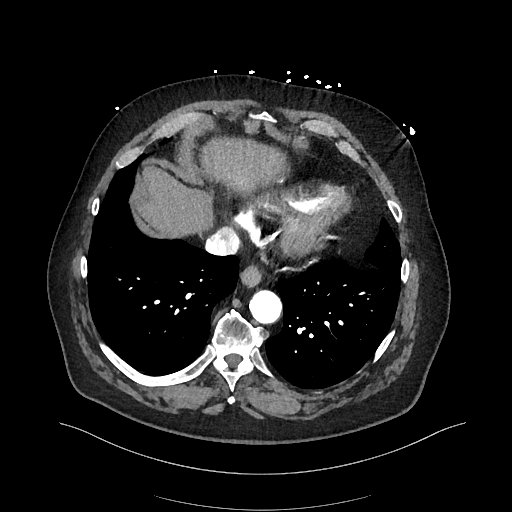
[im 245/340  soft-tissue]
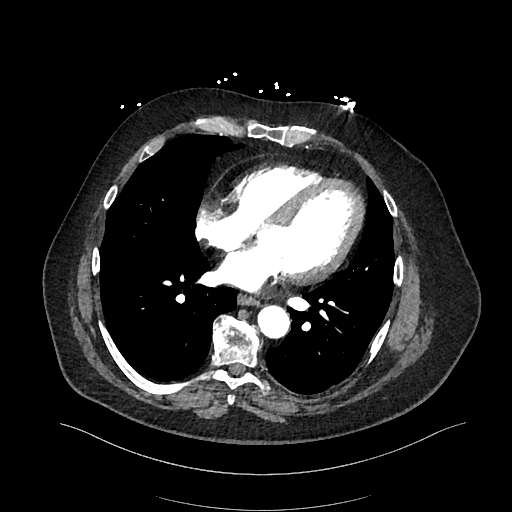
[im 283/340  soft-tissue]
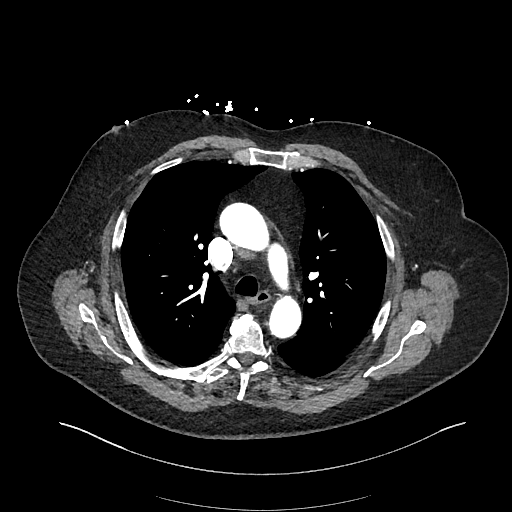
[im 283/340  bone]
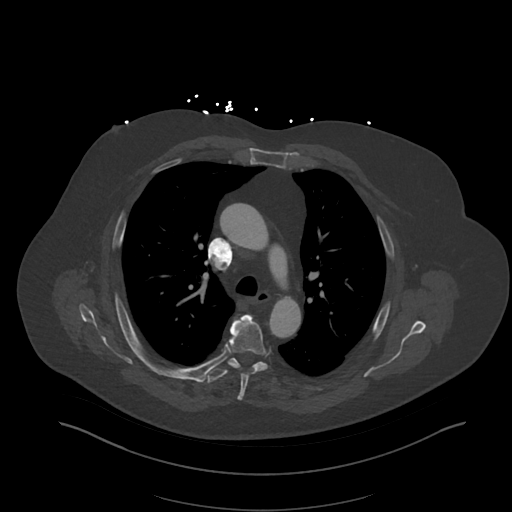
[im 321/340  soft-tissue]
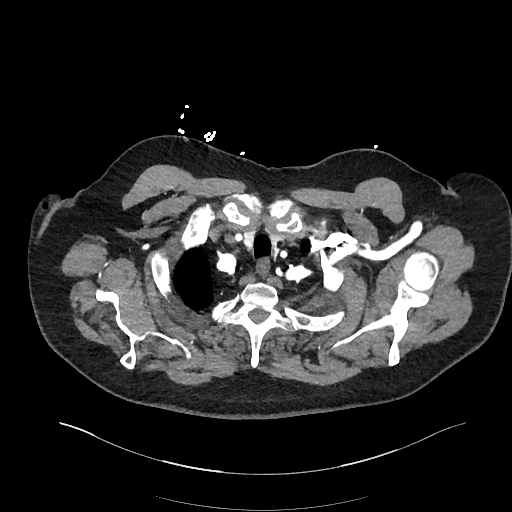

[Series 10: cor · coronal · 1.00mm/px · 3 of 201 slices shown]
[im 51/201  soft-tissue]
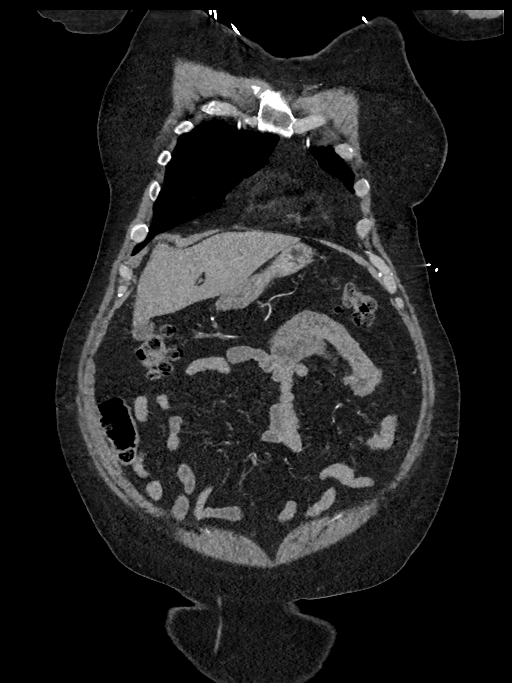
[im 101/201  soft-tissue]
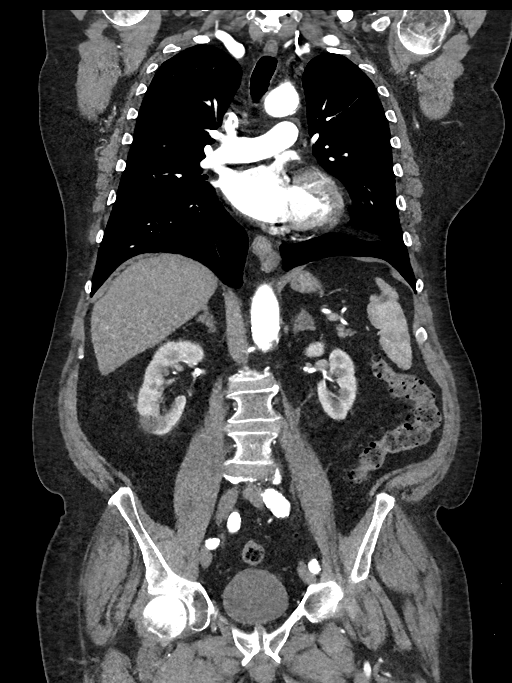
[im 151/201  soft-tissue]
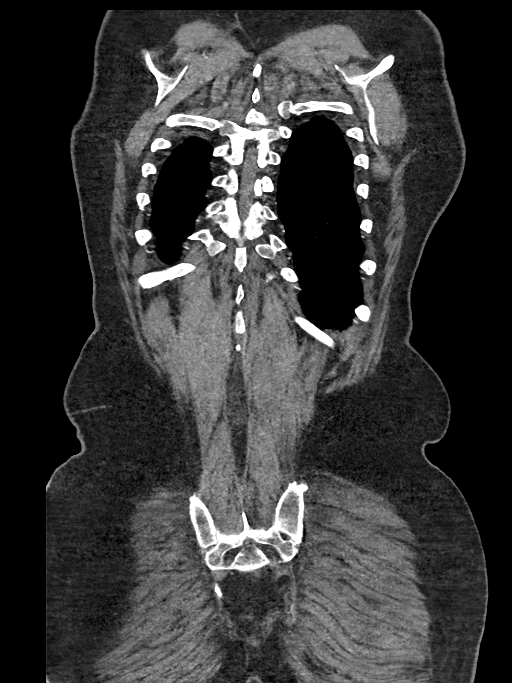

[13 of 46 positions shown; findings below may reference images not displayed]

Multidetector CT imaging through the chest, abdomen and pelvis was
performed using the standard protocol during bolus administration of
intravenous contrast. Multiplanar reconstructed images and MIPs were
obtained and reviewed to evaluate the vascular anatomy.

RADIATION DOSE REDUCTION: This exam was performed according to the
departmental dose-optimization program which includes automated
exposure control, adjustment of the mA and/or kV according to
patient size and/or use of iterative reconstruction technique.

CONTRAST:  100mL OMNIPAQUE IOHEXOL 350 MG/ML SOLN
FINDINGS: CTA CHEST FINDINGS

Cardiovascular: Preferential opacification of the thoracic aorta. No
evidence of thoracic aortic aneurysm or dissection. There is
moderate mixed atherosclerosis of the thoracic aorta. Normal heart
size. No pericardial effusion. Coronary artery atherosclerosis.

Mediastinum/Nodes: No lymphadenopathy.  Small hiatal hernia.

Lungs/Pleura: There are multifocal ground-glass opacities
bilaterally in the upper and lower lobes (right: Series 9, images
59, 99-103, left: Series 9, images 54 and 113). There is a solid 5
mm right medial basilar pulmonary nodule (series 9, image 115). No
pleural effusion. No pneumothorax.

Musculoskeletal: No acute osseous abnormality. No suspicious lytic
or blastic lesions. Multilevel degenerative changes of the spine.

Review of the MIP images confirms the above findings.

CTA ABDOMEN AND PELVIS FINDINGS

VASCULAR

Aorta: Mixed atherosclerotic plaque throughout the abdominal aorta.
There is an infrarenal abdominal aortic aneurysm measuring 4.3 x
cm (series 7, image 214), with prominent intramural thrombus
resulting in focal 60% stenosis (series 7, image 211).

Celiac: Patent without significant stenosis.

SMA: Patent with mild stenosis proximally.

Renals: Patent without significant stenosis.

IMA: Patent.

Inflow: There is mixed atherosclerosis of the iliac vasculature
without significant stenosis, aneurysm, or dissection. There is
bilateral common iliac ectasia.

Veins: No obvious venous abnormality within the limitations of this
arterial phase study.

Review of the MIP images confirms the above findings.

NON-VASCULAR

Hepatobiliary: No focal liver abnormality is seen. The gallbladder
is unremarkable.

Pancreas: Unremarkable. No pancreatic ductal dilatation or
surrounding inflammatory changes.

Spleen: Normal in size without focal abnormality.

Adrenals/Urinary Tract: There is bilateral adrenal thickening, with
probable 1.0 cm left adrenal adenoma (series 6, image 64). No
hydronephrosis or nephrolithiasis. Simple right inferior pole renal
cyst. The bladder is mildly distended. There is a right
posterolateral bladder diverticulum.

Stomach/Bowel: Small hiatal hernia. The stomach is otherwise within
normal limits. There is no evidence of bowel obstruction.The
appendix is normal. There is extensive colonic diverticulosis. No
evidence of acute diverticulitis.

Lymphatic: No lymphadenopathy

Reproductive: Unremarkable.

Other: There is a small fat containing left inguinal hernia. No
ascites. No free air.

Musculoskeletal: No acute osseous abnormality. No suspicious lytic
or blastic lesions. Multilevel degenerative changes of the spine.
Left greater than right SI joint osteoarthritis.

Review of the MIP images confirms the above findings.
IMPRESSION: CHEST:

No acute thoracic aortic pathology. Moderate thoracic aortic
atherosclerosis. Coronary artery atherosclerosis.

Multifocal patchy ground-glass opacities bilaterally, most
consistent with an infectious/inflammatory process. Recommend
follow-up chest CT in 3 months.

Solid 5 mm right medial basilar pulmonary nodule for which a 12
month follow-up CT can be considered, can also be re-evaluated on
the above recommended follow-up chest CT.

ABDOMEN/PELVIS:

Infrarenal abdominal aortic aneurysm measuring up to 4.3 cm, with
prominent intramural thrombus resulting in 60% focal stenosis. No
abdominal aortic dissection. Recommend follow-up every 12 months and
vascular consultation. This recommendation follows ACR consensus
guidelines: White Paper of the ACR Incidental Findings Committee II
on Vascular Findings. [HOSPITAL] 2806; [DATE].

No other acute findings in the abdomen or pelvis.

Extensive colonic diverticulosis.  Normal appendix.

## 2024-01-04 IMAGING — CR DG CHEST 1V
1 series · 1 of 1 positions shown · non-contrast
Comparison: None Available.

CLINICAL DATA: Left-sided chest pain.  Back pain.

EXAM:
CHEST  1 VIEW

[chest pa]
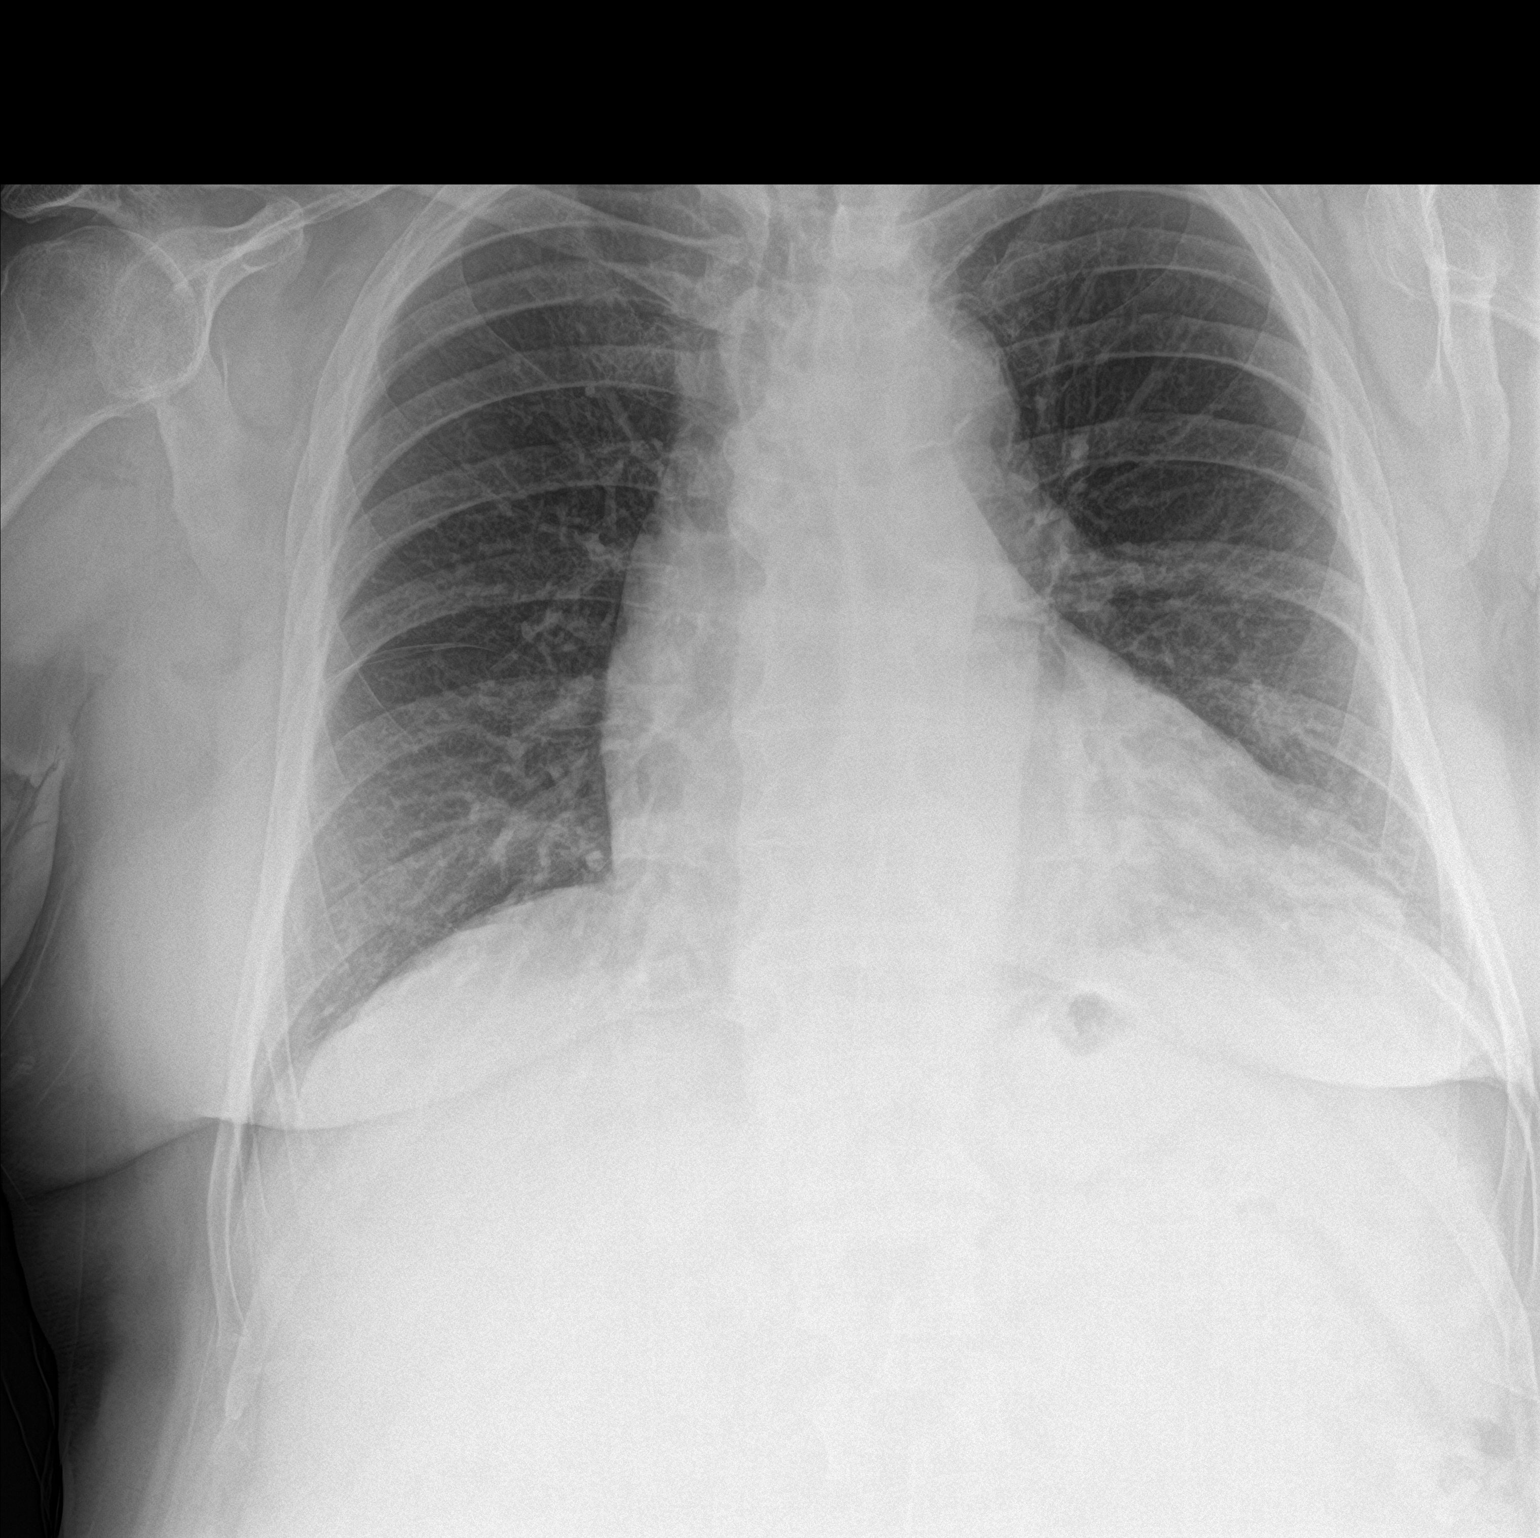

[1 of 1 positions shown; findings below may reference images not displayed]

FINDINGS: Left basilar airspace disease which may reflect atelectasis versus
pneumonia. No pleural effusion or pneumothorax. Heart and
mediastinal contours are unremarkable.

No acute osseous abnormality.
IMPRESSION: 1. Left basilar airspace disease which may reflect atelectasis
versus pneumonia.

## 2024-03-11 DIAGNOSIS — M533 Sacrococcygeal disorders, not elsewhere classified: Secondary | ICD-10-CM | POA: Diagnosis not present

## 2024-03-11 DIAGNOSIS — M461 Sacroiliitis, not elsewhere classified: Secondary | ICD-10-CM | POA: Diagnosis not present

## 2024-03-11 DIAGNOSIS — G894 Chronic pain syndrome: Secondary | ICD-10-CM | POA: Diagnosis not present

## 2024-03-24 ENCOUNTER — Other Ambulatory Visit (HOSPITAL_COMMUNITY)

## 2024-03-24 ENCOUNTER — Ambulatory Visit: Admitting: Vascular Surgery

## 2024-05-29 ENCOUNTER — Ambulatory Visit: Payer: No Typology Code available for payment source

## 2024-05-29 ENCOUNTER — Other Ambulatory Visit (HOSPITAL_COMMUNITY): Payer: No Typology Code available for payment source

## 2024-06-01 ENCOUNTER — Telehealth: Payer: Self-pay | Admitting: Physician Assistant

## 2024-06-01 NOTE — Telephone Encounter (Signed)
  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 He has since relocated to [place]  and will establish care with cardiology in Jan 2026. Has echo scheduled 08/11/24. 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 KCCQ completed below.    [place] Cardiomyopathy Questionnaire     06/01/2024    1:32 PM 07/22/2023   10:26 AM 05/06/2023    1:45 PM  KCCQ-12  1 a. Ability to shower/bathe Not at all limited Not at all limited Extremely limited  1 b. Ability to walk 1 block Other, Did not do Not at all limited Extremely limited  1 c. Ability to hurry/jog Other, Did not do Slightly limited Extremely limited  2. Edema feet/ankles/legs Every morning Never over the past 2 weeks Never over the past 2 weeks  3. Limited by fatigue Never over the past 2 weeks Less than once a week Several times a day  4. Limited by dyspnea Never over the past 2 weeks Never over the past 2 weeks Several times a day  5. Sitting up / on 3+ pillows Never over the past 2 weeks Never over the past 2 weeks 1-2 times a week  6. Limited enjoyment of life Not limited at all Not limited at all Not limited at all  7. Rest of life w/ symptoms Completely satisfied Completely satisfied Not at all satisfied  8 a. Participation in hobbies N/A, did not do for other reasons Did not limit at all Limited quite a bit  8 b. Participation in chores N/A, did not do for other reasons Did not limit at all Limited quite a bit  8 c. Visiting family/friends Did not limit at all Did not limit at all Moderately limited    Lamarr Hummer PA-C  [facility]

## 2024-06-08 ENCOUNTER — Telehealth: Payer: Self-pay
